# Patient Record
Sex: Female | Born: 1950 | Hispanic: No | Smoking: Former smoker
Health system: Southern US, Community
[De-identification: ages and names within clinical notes are randomized; demographics above are authoritative.]

## PROBLEM LIST (undated history)

## (undated) DIAGNOSIS — K279 Peptic ulcer, site unspecified, unspecified as acute or chronic, without hemorrhage or perforation: Secondary | ICD-10-CM

## (undated) DIAGNOSIS — K1379 Other lesions of oral mucosa: Secondary | ICD-10-CM

## (undated) DIAGNOSIS — Z923 Personal history of irradiation: Secondary | ICD-10-CM

## (undated) DIAGNOSIS — C411 Malignant neoplasm of mandible: Principal | ICD-10-CM

## (undated) HISTORY — PX: TONGUE BIOPSY: SHX1075

## (undated) HISTORY — PX: TONSILLECTOMY: SUR1361

## (undated) HISTORY — DX: Other lesions of oral mucosa: K13.79

## (undated) HISTORY — DX: Malignant neoplasm of mandible: C41.1

---

## 2013-02-12 ENCOUNTER — Encounter (HOSPITAL_COMMUNITY): Payer: Self-pay | Admitting: Emergency Medicine

## 2013-02-12 ENCOUNTER — Emergency Department (HOSPITAL_COMMUNITY)
Admission: EM | Admit: 2013-02-12 | Discharge: 2013-02-12 | Disposition: A | Discharge status: Home / Self Care | Attending: Emergency Medicine | Admitting: Emergency Medicine

## 2013-02-12 DIAGNOSIS — K047 Periapical abscess without sinus: Secondary | ICD-10-CM | POA: Insufficient documentation

## 2013-02-12 DIAGNOSIS — K1379 Other lesions of oral mucosa: Secondary | ICD-10-CM

## 2013-02-12 DIAGNOSIS — K1370 Unspecified lesions of oral mucosa: Secondary | ICD-10-CM | POA: Insufficient documentation

## 2013-02-12 DIAGNOSIS — F17200 Nicotine dependence, unspecified, uncomplicated: Secondary | ICD-10-CM | POA: Insufficient documentation

## 2013-02-12 DIAGNOSIS — K029 Dental caries, unspecified: Secondary | ICD-10-CM | POA: Insufficient documentation

## 2013-02-12 MED ORDER — HYDROCODONE-ACETAMINOPHEN 5-325 MG PO TABS: 1.0000 | ORAL_TABLET | Freq: Four times a day (QID) | ORAL | Status: DC | PRN

## 2013-02-12 MED ORDER — HYDROCODONE-ACETAMINOPHEN 5-325 MG PO TABS
1.0000 | ORAL_TABLET | Freq: Once | ORAL | Status: AC
  Administered 2013-02-12: 1 via ORAL
  Filled 2013-02-12: qty 1

## 2013-02-12 MED ORDER — PENICILLIN V POTASSIUM 500 MG PO TABS: 500.0000 mg | ORAL_TABLET | Freq: Four times a day (QID) | ORAL | Status: DC

## 2013-02-12 NOTE — ED Provider Notes (Signed)
Medical screening examination/treatment/procedure(s) were conducted as a shared visit with non-physician practitioner(s) and myself.  I personally evaluated the patient during the encounter.  EKG Interpretation   None       Patient is a 62 year-old female with a history of tobacco use who presents to the emergency department with pain at the floor of her right mouth since the beginning of September. She states the pain has been increasing making it difficult for her to EMS when she came to the emergency department. No fever. No drooling or trismus. No swelling of her face. On exam, patient is hemodynamically stable. No trismus or drooling. She has poor dentition but no obvious abscess. No submandibular swelling. No airway compromise. Patient has a raised, erythematous lesion to the floor of her mouth on the right side concerning for malignancy. We'll place patient on penicillin given her poor dentition in case there is any periapical abscess will also refer patient to ENT for close followup for biopsy. Given return precautions. Patient verbalizes understanding and states she is comfortable with outpatient followup. We'll discharge with pain medication.  Theresa Maw Ozelle Brubacher, DO 02/12/13 [phone]

## 2013-02-12 NOTE — ED Notes (Signed)
Pt st's she has had a sore in her mouth on right side for several months.  St's now it has caused her tooth to become loose and painful.  Also st's tongue on right side is painful

## 2013-02-12 NOTE — ED Provider Notes (Signed)
CSN: [id number]     Arrival date & time 02/12/13  1530 History   First MD Initiated Contact with Patient 02/12/13 1546     Chief Complaint  Patient presents with  . Dental Pain   (Consider location/radiation/quality/duration/timing/severity/associated sxs/prior Treatment) HPI Comments: Patient is a 62 yo F PMHx significant for tobacco use presented to emergency department for right-sided lower jaw pain that began in September. She states she's had increased swelling to the area along with increased pain. She states her pain is radiating to right side of her face and tongue. She rates her pain currently 6/10. She states is worsened with eating and drinking and palpation. She describes it as sore in nature. She states ibuprofen has minimally alleviated her pain until she ran out of it. She denies any fevers, drainage. She does not have a dentist.  Patient is a 62 y.o. female presenting with tooth pain.  Dental Pain Associated symptoms: no fever     History reviewed. No pertinent past medical history. History reviewed. No pertinent past surgical history. No family history on file. History  Substance Use Topics  . Smoking status: Current Every Day Smoker  . Smokeless tobacco: Not on file  . Alcohol Use: No   OB History   Grav Para Term Preterm Abortions TAB SAB Ect Mult Living                 Review of Systems  Constitutional: Negative for fever and chills.  HENT: Positive for dental problem. Negative for ear pain and sore throat.   Respiratory: Negative for shortness of breath.   Cardiovascular: Negative for chest pain.    Allergies  Review of patient's allergies indicates no known allergies.  Home Medications   Current Outpatient Rx  Name  Route  Sig  Dispense  Refill  . ibuprofen (ADVIL,MOTRIN) 200 MG tablet   Oral   Take 200-400 mg by mouth 2 (two) times daily as needed (pain).         Marland Kitchen HYDROcodone-acetaminophen (NORCO/VICODIN) 5-325 MG per tablet   Oral   Take 1-2  tablets by mouth every 6 (six) hours as needed for severe pain.   15 tablet   0   . penicillin v potassium (VEETID) 500 MG tablet   Oral   Take 1 tablet (500 mg total) by mouth 4 (four) times daily.   40 tablet   0    BP 175/85  Pulse 100  Temp(Src) 98.4 F (36.9 C) (Oral)  Resp 20  SpO2 98% Physical Exam  Constitutional: She is oriented to person, place, and time. She appears well-developed and well-nourished. No distress.  HENT:  Head: Normocephalic and atraumatic.  Right Ear: External ear normal.  Left Ear: External ear normal.  Nose: Nose normal.  Mouth/Throat: Uvula is midline, oropharynx is clear and moist and mucous membranes are normal. She does not have dentures. No trismus in the jaw. Abnormal dentition. Dental caries present. No uvula swelling or lacerations.    Swelling to area noted. No submental tenderness or swelling appreciated.   Eyes: Conjunctivae are normal.  Neck: Normal range of motion. Neck supple.  Pulmonary/Chest: Effort normal.  Neurological: She is alert and oriented to person, place, and time.  Skin: Skin is warm and dry. She is not diaphoretic.  Psychiatric: She has a normal mood and affect.    ED Course  Procedures (including critical care time) Labs Review Labs Reviewed - No data to display Imaging Review No results found.  EKG  Interpretation   None       MDM   1. Dental infection   2. Mass of oral cavity    Afebrile, NAD, non-toxic appearing, AAOx4. Patient with toothache. Patient with mass concerning for oral cancer. No gross abscess.  Exam unconcerning for Ludwig's angina or spread of infection.  Will treat with penicillin and pain medicine.  Urged patient to follow-up with oral surgeon and/or ENT to discuss biopsy for oral mass to r/o cancer. Patient is agreeable to plan. Patient is stable at time of discharge. Patient d/w with Dr. Elesa Massed, agrees with plan.         Jeannetta Ellis, PA-C 02/12/13 1847

## 2013-02-12 NOTE — ED Notes (Signed)
Pt here with mouth pain due to a sore in right side of mouth for several months.  St's now tooth on that side is loose also causing pain

## 2013-02-13 NOTE — ED Provider Notes (Signed)
Medical screening examination/treatment/procedure(s) were performed by non-physician practitioner and as supervising physician I was immediately available for consultation/collaboration.  EKG Interpretation   None         Xayla Puzio N Martinez Boxx, DO 02/13/13 0001 

## 2013-02-25 ENCOUNTER — Ambulatory Visit (INDEPENDENT_AMBULATORY_CARE_PROVIDER_SITE_OTHER): Admitting: Otolaryngology

## 2013-02-25 DIAGNOSIS — D3701 Neoplasm of uncertain behavior of lip: Secondary | ICD-10-CM

## 2013-03-05 ENCOUNTER — Telehealth: Payer: Self-pay | Admitting: Hematology and Oncology

## 2013-03-05 NOTE — Telephone Encounter (Signed)
C/D 03/05/13 for appt. 03/12/13

## 2013-03-12 ENCOUNTER — Ambulatory Visit (HOSPITAL_BASED_OUTPATIENT_CLINIC_OR_DEPARTMENT_OTHER)

## 2013-03-12 ENCOUNTER — Telehealth: Payer: Self-pay | Admitting: Hematology and Oncology

## 2013-03-12 ENCOUNTER — Ambulatory Visit: Payer: TRICARE For Life (TFL)

## 2013-03-12 ENCOUNTER — Encounter: Payer: Self-pay | Admitting: Hematology and Oncology

## 2013-03-12 ENCOUNTER — Encounter: Payer: Self-pay | Admitting: *Deleted

## 2013-03-12 ENCOUNTER — Ambulatory Visit (HOSPITAL_BASED_OUTPATIENT_CLINIC_OR_DEPARTMENT_OTHER): Admitting: Hematology and Oncology

## 2013-03-12 VITALS — BP 149/77 | HR 89 | Temp 98.6°F | Resp 20 | Ht 60.0 in | Wt 165.2 lb

## 2013-03-12 DIAGNOSIS — Z87891 Personal history of nicotine dependence: Secondary | ICD-10-CM

## 2013-03-12 DIAGNOSIS — C411 Malignant neoplasm of mandible: Secondary | ICD-10-CM

## 2013-03-12 DIAGNOSIS — K1379 Other lesions of oral mucosa: Secondary | ICD-10-CM

## 2013-03-12 DIAGNOSIS — K1370 Unspecified lesions of oral mucosa: Secondary | ICD-10-CM

## 2013-03-12 DIAGNOSIS — C031 Malignant neoplasm of lower gum: Secondary | ICD-10-CM

## 2013-03-12 HISTORY — DX: Malignant neoplasm of mandible: C41.1

## 2013-03-12 HISTORY — DX: Other lesions of oral mucosa: K13.79

## 2013-03-12 LAB — CBC WITH DIFFERENTIAL/PLATELET
BASO%: 0.8 % (ref 0.0–2.0)
Basophils Absolute: 0.1 10*3/uL (ref 0.0–0.1)
EOS%: 1.9 % (ref 0.0–7.0)
Eosinophils Absolute: 0.2 10*3/uL (ref 0.0–0.5)
HEMATOCRIT: 41.3 % (ref 34.8–46.6)
HEMOGLOBIN: 13.8 g/dL (ref 11.6–15.9)
LYMPH#: 1.9 10*3/uL (ref 0.9–3.3)
LYMPH%: 16.9 % (ref 14.0–49.7)
MCH: 29 pg (ref 25.1–34.0)
MCHC: 33.5 g/dL (ref 31.5–36.0)
MCV: 86.7 fL (ref 79.5–101.0)
MONO#: 0.8 10*3/uL (ref 0.1–0.9)
MONO%: 7.6 % (ref 0.0–14.0)
NEUT#: 8.1 10*3/uL — ABNORMAL HIGH (ref 1.5–6.5)
NEUT%: 72.8 % (ref 38.4–76.8)
Platelets: 312 10*3/uL (ref 145–400)
RBC: 4.76 10*6/uL (ref 3.70–5.45)
RDW: 13.1 % (ref 11.2–14.5)
WBC: 11.2 10*3/uL — AB (ref 3.9–10.3)

## 2013-03-12 LAB — COMPREHENSIVE METABOLIC PANEL (CC13)
ALBUMIN: 3.8 g/dL (ref 3.5–5.0)
ALT: 26 U/L (ref 0–55)
ANION GAP: 10 meq/L (ref 3–11)
AST: 20 U/L (ref 5–34)
Alkaline Phosphatase: 110 U/L (ref 40–150)
BUN: 12.4 mg/dL (ref 7.0–26.0)
CALCIUM: 9.9 mg/dL (ref 8.4–10.4)
CHLORIDE: 106 meq/L (ref 98–109)
CO2: 27 meq/L (ref 22–29)
CREATININE: 0.7 mg/dL (ref 0.6–1.1)
Glucose: 121 mg/dl (ref 70–140)
POTASSIUM: 4.3 meq/L (ref 3.5–5.1)
Sodium: 144 mEq/L (ref 136–145)
Total Bilirubin: 0.21 mg/dL (ref 0.20–1.20)
Total Protein: 7.6 g/dL (ref 6.4–8.3)

## 2013-03-12 MED ORDER — MORPHINE SULFATE 15 MG PO TABS: 15.0000 mg | ORAL_TABLET | ORAL | Status: DC | PRN

## 2013-03-12 MED ORDER — POLYETHYLENE GLYCOL 3350 17 G PO PACK: 17.0000 g | PACK | Freq: Every day | ORAL | Status: DC

## 2013-03-12 MED ORDER — PROMETHAZINE HCL 25 MG PO TABS: 25.0000 mg | ORAL_TABLET | Freq: Four times a day (QID) | ORAL | Status: DC | PRN

## 2013-03-12 NOTE — Progress Notes (Signed)
Met with patient and her dtr Shemariah during Initial Consult with Dr. Alvy Bimler.  Introduced my self a her Navigator, explained my support role while a patient at [facility], encouraged them to call at any time with questions/concerns.  Showed them location of [facility] for upcoming PET, explained check-in/registration process for future appts at [facility].  Patient indicated that family does not presently have a car; dtr stated they are presently managing transportation.  Pt indicated she lives at home alone; accompanying dtr lives "5 minutes away".  Pt has another dtr who is available to help her as needed.  Explained that we can provide resources to assist the patient; her appt with [facility] is pending.  She indicated understanding.   Forwarding this documentation to [employer], LCSW.  Gayleen Orem, RN, BSN, [facility] Navigator [phone]

## 2013-03-12 NOTE — Progress Notes (Signed)
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 female is here because of newly diagnosed cancer of the floor of the mouth. This patient had more than 45-pack-year smoking history and recently quit after diagnosis of invasive cancer. According to the patient, she started to notice pain something around September and has gotten progressively worse. The pain is interfering with her ability to ENT to food. She denies any recent weight loss. She was so felt that the tooth is getting loose. She has not received any dental care for long time. She went to the emergency department and was prescribed penicillin and referred to ENT. Dr. Benjamine Mola performed a biopsy in his clinic on 02/25/2013 and pathology reviewed invasive squamous cell carcinoma, moderately differentiated. P. 16 testing is pending She complained of persistent discharge and foul-smelling taste in her mouth. Her mouth pain is rated at 6/10 pain. She stopped taking any pain prescription right now. She denies any palpable lymphadenopathy in her neck. MEDICAL HISTORY:  Past Medical History  Diagnosis Date  . 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 Mouth pain 03/12/2013    SURGICAL HISTORY: Past Surgical History  Procedure Laterality Date  . Tongue biopsy    . Tonsillectomy      SOCIAL HISTORY: History   Social History  . Marital Status: Legally Separated    Spouse Name: N/A    Number of Children: N/A  . Years of Education: N/A   Occupational History  . Not on file.   Social History Main Topics  . Smoking status: Former Smoker -- 1.00 packs/day for 45 years  . Smokeless tobacco: Never Used  . Alcohol Use: No  .  Drug Use: No  . Sexual Activity: Not on file   Other Topics Concern  . Not on file   Social History Narrative  . No narrative on file    FAMILY HISTORY: Family History  Problem Relation Age of Onset  . Cancer Maternal Uncle     lung ca  . Cancer Daughter 28    breast ca    ALLERGIES:  has No Known Allergies.  MEDICATIONS:  Current Outpatient Prescriptions  Medication Sig Dispense Refill  . morphine (MSIR) 15 MG tablet Take 1 tablet (15 mg total) by mouth every 4 (four) hours as needed for severe pain.  60 tablet  0  . polyethylene glycol (MIRALAX) packet Take 17 g by mouth daily.  14 each  0  . promethazine (PHENERGAN) 25 MG tablet Take 1 tablet (25 mg total) by mouth every 6 (six) hours as needed for nausea or vomiting.  30 tablet  0   No current facility-administered medications for this visit.    REVIEW OF SYSTEMS:   Constitutional: Denies fevers, chills or abnormal night sweats Eyes: Denies blurriness of vision, double vision or watery eyes Respiratory: Denies cough, dyspnea or wheezes Cardiovascular: Denies palpitation, chest discomfort or lower extremity swelling Gastrointestinal:  Denies nausea, heartburn or change in bowel habits Skin: Denies abnormal skin rashes Lymphatics: Denies new lymphadenopathy or easy bruising Neurological:Denies numbness, tingling or new weaknesses Behavioral/Psych: Mood is stable, no new changes  All other systems were reviewed with the patient and are negative.  PHYSICAL EXAMINATION: ECOG PERFORMANCE STATUS: 1 -  Symptomatic but completely ambulatory  Filed Vitals:   03/12/13 1034  BP: 149/77  Pulse: 89  Temp: 98.6 F (37 C)  Resp: 20   Filed Weights   03/12/13 1034  Weight: 165 lb 3.2 oz (74.934 kg)    GENERAL:alert, no distress and comfortable SKIN: skin color, texture, turgor are normal, no rashes or significant lesions EYES: normal, conjunctiva are pink and non-injected, sclera clear OROPHARYNX: She has any squamous  cell carcinoma visible from oral exam, measure approximately 2.5 cm involving the alveolar ridge on the right lower mandible area which is tender to touch NECK: supple, thyroid normal size, non-tender, without nodularity LYMPH:  no palpable lymphadenopathy in the cervical, axillary or inguinal LUNGS: clear to auscultation and percussion with normal breathing effort HEART: regular rate & rhythm and no murmurs and no lower extremity edema ABDOMEN:abdomen soft, non-tender and normal bowel sounds Musculoskeletal:no cyanosis of digits and no clubbing  PSYCH: alert & oriented x 3 with fluent speech NEURO: no focal motor/sensory deficits  LABORATORY DATA:  I reviewed her pathology report ASSESSMENT:  Squamous cell carcinoma of the mandible  PLAN:  I will order bloodwork and PET/CT scan as soon as possible. Typically, cancer of the mandible is a surgical disease that is treated with surgery first In any case, I will order staging scan and see her back within the next 10 days to review test results. For her mouth pain, I'm prescribing immediate release morphine. I warned about potential risk of nausea and constipation I gave her some antiemetics and laxatives to take as needed. Orders Placed This Encounter  Procedures  . NM PET Image Initial (PI) Skull Base To Thigh    Standing Status: Future     Number of Occurrences:      Standing Expiration Date: 03/12/2014    Order Specific Question:  Reason for exam:    Answer:  staging mandibular cancer    Order Specific Question:  Preferred imaging location?    Answer:  [facility]  . CBC with Differential    Standing Status: Future     Number of Occurrences:      Standing Expiration Date: 12/02/2013  . Comprehensive metabolic panel    Standing Status: Future     Number of Occurrences:      Standing Expiration Date: 03/12/2014    All questions were answered. The patient knows to call the clinic with any problems, questions or concerns.     [place], Secor, MD 03/12/2013 11:10 AM

## 2013-03-12 NOTE — Telephone Encounter (Signed)
appts made per 03/12/13 POF AVS and CAL given shh °

## 2013-03-19 ENCOUNTER — Telehealth: Payer: Self-pay | Admitting: *Deleted

## 2013-03-19 ENCOUNTER — Encounter (HOSPITAL_COMMUNITY)
Admission: RE | Admit: 2013-03-19 | Discharge: 2013-03-19 | Disposition: A | Discharge status: Home / Self Care | Source: Ambulatory Visit | Attending: Hematology and Oncology | Admitting: Hematology and Oncology

## 2013-03-19 ENCOUNTER — Telehealth: Payer: Self-pay | Admitting: Hematology and Oncology

## 2013-03-19 DIAGNOSIS — C031 Malignant neoplasm of lower gum: Secondary | ICD-10-CM | POA: Insufficient documentation

## 2013-03-19 DIAGNOSIS — C411 Malignant neoplasm of mandible: Secondary | ICD-10-CM

## 2013-03-19 DIAGNOSIS — E079 Disorder of thyroid, unspecified: Secondary | ICD-10-CM | POA: Insufficient documentation

## 2013-03-19 DIAGNOSIS — K1379 Other lesions of oral mucosa: Secondary | ICD-10-CM

## 2013-03-19 DIAGNOSIS — R918 Other nonspecific abnormal finding of lung field: Secondary | ICD-10-CM | POA: Insufficient documentation

## 2013-03-19 LAB — GLUCOSE, CAPILLARY: Glucose-Capillary: 111 mg/dL — ABNORMAL HIGH (ref 70–99)

## 2013-03-19 MED ORDER — FLUDEOXYGLUCOSE F - 18 (FDG) INJECTION
19.2000 | Freq: Once | INTRAVENOUS | Status: AC | PRN
  Administered 2013-03-19: 19.2 via INTRAVENOUS

## 2013-03-19 NOTE — Telephone Encounter (Signed)
Left VM for pt to return nurse's call.  

## 2013-03-19 NOTE — Telephone Encounter (Signed)
S/w pt and informed of appt moved to Tues 1/13 from 1/15 since PET scan results are available.  She agreed and will be here to see Dr. Alvy Bimler at 1 pm on 1/13.

## 2013-03-19 NOTE — Telephone Encounter (Signed)
per 1/9 pof cx appt for 1/15 and r/s to 1/13 @ 1pm. per pof pt aware.

## 2013-03-19 NOTE — Telephone Encounter (Signed)
Message copied by Cathlean Cower on Fri Mar 19, 2013 11:18 AM ------      Message from: [facility], [place]      Created: Fri Mar 19, 2013 11:11 AM      Regarding: PEt scan result       Please call pt to move her appt to Tuesday. PET results are availble ------

## 2013-03-23 ENCOUNTER — Encounter: Payer: Self-pay | Admitting: Hematology and Oncology

## 2013-03-23 ENCOUNTER — Ambulatory Visit (HOSPITAL_BASED_OUTPATIENT_CLINIC_OR_DEPARTMENT_OTHER): Admitting: Hematology and Oncology

## 2013-03-23 VITALS — BP 132/69 | HR 87 | Temp 98.6°F | Resp 18 | Ht 60.0 in | Wt 162.6 lb

## 2013-03-23 DIAGNOSIS — R52 Pain, unspecified: Secondary | ICD-10-CM

## 2013-03-23 DIAGNOSIS — K5909 Other constipation: Secondary | ICD-10-CM

## 2013-03-23 DIAGNOSIS — K1379 Other lesions of oral mucosa: Secondary | ICD-10-CM

## 2013-03-23 DIAGNOSIS — C411 Malignant neoplasm of mandible: Secondary | ICD-10-CM

## 2013-03-23 DIAGNOSIS — R918 Other nonspecific abnormal finding of lung field: Secondary | ICD-10-CM

## 2013-03-23 MED ORDER — MORPHINE SULFATE 15 MG PO TABS: 15.0000 mg | ORAL_TABLET | ORAL | Status: DC | PRN

## 2013-03-23 NOTE — Progress Notes (Signed)
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 16 testing is pending   03/19/2013 Imaging PET/CT scan confirmed hypermetabolic activity in the mandible as well as adjacent ipsilateral lymph nodes. There are 2 pulmonary nodules noted of unknown etiology.    INTERVAL HISTORY: Theresa Stephens 63 y.o. female returns for further followup. With the morphine prescription, mouth pain is tolerable. She average about 4 or 5 pills per day. She has severe constipation. She denies any nausea.  I have reviewed the past medical history, past surgical history, social history and family history with the patient and they are unchanged from previous note.  ALLERGIES:  has No Known Allergies.  MEDICATIONS:  Current Outpatient Prescriptions  Medication Sig Dispense Refill  . morphine (MSIR) 15 MG tablet Take 1 tablet (15 mg total) by mouth every 4 (four) hours as needed for severe pain.  60 tablet  0  . polyethylene glycol (MIRALAX) packet Take 17 g by mouth daily.  14 each  0  . promethazine (PHENERGAN) 25 MG tablet Take 1 tablet (25 mg total) by mouth every 6 (six) hours as needed for nausea or  vomiting.  30 tablet  0   No current facility-administered medications for this visit.    REVIEW OF SYSTEMS:   Constitutional: Denies fevers, chills or abnormal weight loss All other systems were reviewed with the patient and are negative.  PHYSICAL EXAMINATION: ECOG PERFORMANCE STATUS: 1 - Symptomatic but completely ambulatory  Filed Vitals:   03/23/13 1259  BP: 132/69  Pulse: 87  Temp: 98.6 F (37 C)  Resp: 18   Filed Weights   03/23/13 1259  Weight: 162 lb 9.6 oz (73.755 kg)    GENERAL:alert, no distress and comfortable SKIN: skin color, texture, turgor are normal, no rashes or significant lesions Significant swelling noted on the right side of the mild with a mass noted, unchanged compared to previous exam NEURO: alert & oriented x 3 with fluent speech, no focal motor/sensory deficits  LABORATORY DATA:  I have reviewed the data as listed    Component Value Date/Time   NA 144 03/12/2013 1147   K 4.3 03/12/2013 1147   CO2 27 03/12/2013 1147   GLUCOSE 121 03/12/2013 1147   BUN 12.4 03/12/2013 1147   CREATININE 0.7 03/12/2013 1147   CALCIUM 9.9 03/12/2013 1147   PROT 7.6 03/12/2013 1147   ALBUMIN 3.8 03/12/2013 1147   AST 20 03/12/2013 1147   ALT 26 03/12/2013 1147   ALKPHOS 110 03/12/2013 1147   BILITOT 0.21 03/12/2013 1147    No results found for this basename: SPEP, UPEP,  kappa and lambda light chains    Lab Results  Component Value Date   WBC 11.2* 03/12/2013   NEUTROABS 8.1* 03/12/2013   HGB 13.8 03/12/2013   HCT 41.3 03/12/2013   MCV 86.7 03/12/2013   PLT 312 03/12/2013      Chemistry      Component Value Date/Time   NA 144 03/12/2013 1147   K 4.3 03/12/2013 1147   CO2 27 03/12/2013 1147   BUN 12.4 03/12/2013 1147   CREATININE 0.7 03/12/2013 1147      Component Value Date/Time   CALCIUM 9.9 03/12/2013 1147   ALKPHOS 110 03/12/2013 1147   AST 20 03/12/2013 1147   ALT 26 03/12/2013 1147   BILITOT 0.21 03/12/2013 1147     RADIOGRAPHIC STUDIES: I reviewed the PET/CT scan with her and her  daughter I have personally reviewed the radiological images as listed and agreed with the findings in the report. I am not too concerned about the lung nodule seen. 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 I plan to refer her to [facility] for consideration for surgical resection with possible reconstruction surgery. The patient understood after primary resection, she may need adjuvant treatment in the future. #2 mild pain Refill her pain prescription today #3 severe constipation I recommend she take laxatives as prescribed #4 lung nodules I recommend observation for now. She will need repeat staging scan after her primary resection. All questions were answered. The patient knows to call the clinic with any problems, questions or concerns. No barriers to learning was detected.   [facility], [place], MD 03/23/2013 1:52 PM

## 2013-03-24 ENCOUNTER — Telehealth: Payer: Self-pay | Admitting: Hematology and Oncology

## 2013-03-24 NOTE — Telephone Encounter (Signed)
Pt appt. With Dr. Macky Lower @ [place] is 04/07/12@2 :00. Medical records faxed, slides will be fedex'ed. Pt is aware to hand carry cd to appt.

## 2013-03-25 ENCOUNTER — Ambulatory Visit: Admitting: Hematology and Oncology

## 2013-04-09 ENCOUNTER — Telehealth: Payer: Self-pay | Admitting: Dietician

## 2013-04-09 NOTE — Telephone Encounter (Signed)
Brief Outpatient Oncology Nutrition Note  Patient has been identified to be at risk on malnutrition screen.  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  Patient for surgical resection at [facility].  Called patient due to weight loss.  Patient reports bad taste in the mouth and sore mouth making eating very difficult.  Patient has been taking nutritional supplements, yogurt and soup.    Will mail patient factsheets on taste alterations and sore mouth along with the [place] RD's contact information, coupons for nutritional supplements and recipe ideas for use of Ensure.  Discussed the importance of nutrition especially with upcoming surgery.  Patient does not have transportation and therefore was not interested in an appointment with the RD at this time.  Encouraged her to call if she has any questions.    Antonieta Iba, RD, LDN

## 2013-06-21 NOTE — Progress Notes (Signed)
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 Later in November she had some tooth pain and the tooth came lose. She presented to an ER at [place]. She saw Dr. Lurlean Leyden in medical oncology who referred her for biopsy and to Dr. Vicie Mutters:  M She had a biopsy on 02/25/13, which revealed moderately differentiated invasive squamous cell carcinoma.  -PET-CT scan of skull base to thigh (03/19/13) shows:  "1. Large hypermetabolic right mandibular mass and multiple hypermetabolic adjacent right-sided neck nodes. 2. Two small pulmonary nodules in the right lung are suspicious for metastasis but are below the limits of resolution for PET-CT. Close CT follow-up suggested. 3. No findings for metastatic disease involving the abdomen/pelvis. 4. Right-sided thyroid gland lesion does not show FDG uptake and is likely a benign lesion."  Physical exam  Biopsies (if applicable) revealed:  ACCESSION NUMBER: [id number] RECEIVED: 04/20/2013 ORDERING PHYSICIAN: Marijo Conception , MD PATIENT NAME: Theresa Stephens SURGICAL PATHOLOGY REPORT  FINAL PATHOLOGIC DIAGNOSIS MICROSCOPIC EXAMINATION AND DIAGNOSIS  A: RIGHT POSTERIOR MANDIBLE ALVEOLAR CANAL, EXCISION: Negative for malignancy.  B: MIDLINE FLOOR OF MOUTH, EXCISION: Negative for malignancy.  C: RIGHT VENTRAL TONGUE, EXCISION: Negative for malignancy.  D: RIGHT POSTERIOR MANDIBLE, BIOPSY: Negative for malignancy.  E: LYMPH NODES, RIGHT NECK, REGIONAL LYMPH NODE DISSECTION: Thirty-four lymph nodes, negative for malignancy (0/34). Salivary gland with no diagnostic abnormality.  F: RIGHT MANDIBLE, MANDIBULECTOMY: Invasive squamous cell carcinoma, well-differentiated (8.5 cm), pT4a pN0 pMX. Anterior mandible margin is positive for carcinoma. Anterior floor of mouth margin is positive  for severe squamous epithelial dysplasia. See Cancer Protocol.    LIP AND ORAL CANCER PROTOCOL  SPECIMEN: Lateral border of tongue Floor of mouth, NOS Mandible RECEIVED: Fresh PROCEDURE: Resection Glossectomy: partial glossectomy Mandibulectomy: right mandibulectomy and floor of mouth resection Neck (lymph node) dissection (specify): right zones 1-3 SPECIMEN SIZE: Greatest dimensions: 8.5 cm SPECIMEN LATERALITY: Right TUMOR SITE: Lateral border of tongue Floor of mouth, NOS Mandible TUMOR FOCALITY: Single focus TUMOR SIZE: Greatest dimension: 8.5 cm TUMOR THICKNESS: Tumor thickness: 1.2cm HISTOLOGIC TYPE: Squamous cell carcinoma, conventional HISTOLOGIC GRADE: G1 MARGINS: Margins involved by invasive carcinoma Margin(Stephens), per orientation: Anterior mandible margin Margins involved by carcinoma in situ (includes moderate and severe dysplasia#) Margin(Stephens), per orientation: Anterior floor of mouth margin (severe dysplasia) TREATMENT EFFECT: Not identified LYMPH-VASCULAR INVASION: Not identified PERINEURAL INVASION: Not identified LYMPH NODES, EXTRANODAL EXTENSION: Not identified PATHOLOGIC STAGING (pTNM) (Note I) PRIMARY TUMOR (pT) Excluding Mucosal Malig Melanoma: pT4a REG LYMPH NODES (pN) Excluding Mucosal Malig Melanoma: pN0 Number examined: 34 Number involved: 0 DISTANT METS (pM) Excluding Mucosal Malig Melanoma: Not Applicable   Previous Treatments: 1. 04/20/13: Tracheotomy; right selective neck dissections zones 1 through 3; right mandibulectomy and floor of mouth resection with partial glossectomy with right parascapular osteocutaneous free flap reconstruction. 2. 05/04/13: Mandibulectomy with resection of residual bone margin with revision of mandibular plating.       Nutrition Status:  Weight changes: Lost ~ 20 lbs since surgery.  Weight today 141.3 lbs  Swallowing status: Dyshagia.  Hard to maneuver food/liquids to swallow due to flap and tilting of tongue  since surgery.   Plans, if any, for PEG tube: Feeding tube.  Osmolite 8 0z cans.  Instills 3 cans daily and drinking fruit smoothies.    Tobacco/Marijuana/Snuff/ETOH use: 45 pack year history of  smoking, No drinking, No drugs   Past/Anticipated interventions by otolaryngology, if any: Dr. Macky Lower  Past/Anticipated interventions by medical oncology, if any: Dr. Heath Lark  Referrals yet, to any of the following?  Social Work?   Dentistry? Yes  Swallowing therapy? NO  Nutrition? In Hospital  Med/Onc? Dr. Alvy Bimler  PEG placement? Yes  SAFETY ISSUES:  Prior radiation? No  Pacemaker/ICD? No  Possible current pregnancy? No  Is the patient on methotrexate? No  Current Complaints / other details:  Pain in right upper jaw for tooth extraction 5 days ago.  Decreased rande of motion right arm since surgery - No rehabilitatiomn

## 2013-06-22 ENCOUNTER — Encounter: Payer: Self-pay | Admitting: Radiation Oncology

## 2013-06-23 ENCOUNTER — Ambulatory Visit
Admission: RE | Admit: 2013-06-23 | Discharge: 2013-06-23 | Disposition: A | Discharge status: Home / Self Care | Source: Ambulatory Visit | Attending: Radiation Oncology | Admitting: Radiation Oncology

## 2013-06-23 ENCOUNTER — Telehealth: Payer: Self-pay | Admitting: *Deleted

## 2013-06-23 ENCOUNTER — Encounter: Payer: Self-pay | Admitting: *Deleted

## 2013-06-23 ENCOUNTER — Encounter: Payer: Self-pay | Admitting: Radiation Oncology

## 2013-06-23 VITALS — BP 139/79 | HR 88 | Temp 98.6°F | Ht 60.0 in | Wt 141.3 lb

## 2013-06-23 DIAGNOSIS — C048 Malignant neoplasm of overlapping sites of floor of mouth: Secondary | ICD-10-CM

## 2013-06-23 DIAGNOSIS — C760 Malignant neoplasm of head, face and neck: Secondary | ICD-10-CM | POA: Insufficient documentation

## 2013-06-23 DIAGNOSIS — C041 Malignant neoplasm of lateral floor of mouth: Secondary | ICD-10-CM

## 2013-06-23 DIAGNOSIS — C411 Malignant neoplasm of mandible: Secondary | ICD-10-CM

## 2013-06-23 DIAGNOSIS — Z510 Encounter for antineoplastic radiation therapy: Secondary | ICD-10-CM | POA: Insufficient documentation

## 2013-06-23 LAB — BUN AND CREATININE (CC13)
BUN: 13.2 mg/dL (ref 7.0–26.0)
CREATININE: 0.6 mg/dL (ref 0.6–1.1)

## 2013-06-23 NOTE — Telephone Encounter (Signed)
Per my review of Dr. Octavia Heir, DDS, [facility], 06/17/13 note, he referred patient to Darel Hong, DDS, for follow-up treatments/care.  Per my call to Dr. Rozann Lesches office, she is not a patient of record, i.e she has not called to set up an appt.  Called patient, encouraged her to call Dr. Graciella Freer, provider her his phone number. [phone]).  She stated she would call him.    Gayleen Orem, RN, BSN, [facility] Head & Neck Oncology Navigator [phone]

## 2013-06-23 NOTE — Progress Notes (Addendum)
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 female who noticed a mass in her mouth in September 2014. She later developed tooth pain and a tooth came loose. She initially presented to the emergency room at [place] and was later seen by Dr. Alvy Stephens of medical oncology. She was ultimately referred to Dr. Vicie Stephens at [facility] otolaryngology. Biopsy in of the floor of mouth reavealed invasive squamous cell carcinoma. P. 16 stain was negative. She went underwent resection (partial glossectomy and right mandibulectomy and floor of mouth resection with flap reconstruction ) plus ipsilateral neck dissection on 04/20/2013 by Dr. Vicie Stephens. 0/34 lymph nodes revealed malignancy from right zones one through 3.. The tumor was unifocal, 8.5cm greatest dimension, 1.2 cm thick, grade 1. The anterior mandible margin was initially positive for carcinoma. Pathologic stage T4a N0. She underwent reexcision for positive margin on 05/04/2013 and no malignancy was identified in the pathology report.  She reports some swallowing difficulty related to postoperative changes in her mouth and she mainly eats smoothies + liquefied foods. Her tracheotomy has been closed and she has some residual stitches in her neck. She has lost about 20 pounds since surgery. She is using her PEG tube to supplement her nutrition. She quit smoking in 2014, December. She has never chewed tobacco and she denies alcohol abuse. She has had dental extractions performed at [place]: No  PAST MEDICAL HISTORY:  has a past medical history  of Mouth pain (03/12/2013) and Squamous cell carcinoma of mandibular alveolar ridge (03/12/2013).    PAST SURGICAL HISTORY: Past Surgical History  Procedure Laterality Date  . Tongue biopsy    . Tonsillectomy      FAMILY HISTORY: family history includes Cancer in her maternal uncle; Cancer (age of onset: 33) in her daughter.  SOCIAL HISTORY:  reports that she quit smoking about 3 months ago. She has never used smokeless tobacco. She reports that she does not drink alcohol or use illicit drugs.  ALLERGIES: Review of patient'Stephens allergies indicates no known allergies.  MEDICATIONS:  Current Outpatient Prescriptions  Medication Sig Dispense Refill  . FLUoxetine (PROZAC) 10 MG capsule Take 10 mg by mouth daily.      Marland Kitchen morphine (MSIR) 15 MG tablet Take 1 tablet (15 mg total) by mouth every 4 (four) hours as needed for severe pain.  60 tablet  0  . polyethylene glycol (MIRALAX) packet Take 17 g by mouth daily.  14 each  0  . promethazine (PHENERGAN) 25 MG tablet Take 1 tablet (25 mg total) by mouth every 6 (six) hours as needed for nausea or vomiting.  30 tablet  0   No current facility-administered medications for this encounter.    REVIEW OF SYSTEMS:  Notable for that above.   PHYSICAL EXAM:  height is 5' (1.524 m) and weight is 141 lb 4.8 oz (64.093 kg). Her temperature is 98.6 F (37 C). Her blood pressure is 139/79 and her pulse is 88. Her oxygen saturation is 100%.   General: Alert and oriented, in no acute distress HEENT: Head is normocephalic.  Extraocular  movements are intact. Flap reconstruction in mouth healed well. No bleeding, no signs of recurrence. Post operative swelling in face/right upper neck. No thrush. Neck: No palpable cervical or supraclavicular lymphadenopathy. Heart: Regular in rate and rhythm with no murmurs, rubs, or gallops. Chest: Clear to auscultation bilaterally, with no rhonchi, wheezes, or rales. Abdomen: Soft, nontender, nondistended, with no rigidity or  guarding. PEG tube in place. No sign of infection. Extremities: No cyanosis or edema. Decreased ROM in Right shoulder. Lymphatics: No concerning lymphadenopathy. Skin: No concerning lesions. R posterior shoulder healed from flap surgery. Musculoskeletal: Decreased ROM in Right shoulder. Neurologic: Cranial nerves II through XII are grossly intact. No obvious focalities. Speech is fluent. Coordination is intact. Psychiatric: Judgment and insight are intact. Affect is appropriate.    ECOG = 1  0 - Asymptomatic (Fully active, able to carry on all predisease activities without restriction)  1 - Symptomatic but completely ambulatory (Restricted in physically strenuous activity but ambulatory and able to carry out work of a light or sedentary nature. For example, light housework, office work)  2 - Symptomatic, <50% in bed during the day (Ambulatory and capable of all self care but unable to carry out any work activities. Up and about more than 50% of waking hours)  3 - Symptomatic, >50% in bed, but not bedbound (Capable of only limited self-care, confined to bed or chair 50% or more of waking hours)  4 - Bedbound (Completely disabled. Cannot carry on any self-care. Totally confined to bed or chair)  5 - Death   Eustace Pen MM, Creech RH, Tormey DC, et al. [phone]). "Toxicity and response criteria of the [facility]". [place] Oncol. 5 (6): 649-55   LABORATORY DATA:  Lab Results  Component Value Date   WBC 11.2* 03/12/2013   HGB 13.8 03/12/2013   HCT 41.3 03/12/2013   MCV 86.7 03/12/2013   PLT 312 03/12/2013   CMP     Component Value Date/Time   NA 144 03/12/2013 1147   K 4.3 03/12/2013 1147   CO2 27 03/12/2013 1147   GLUCOSE 121 03/12/2013 1147   BUN 13.2 06/23/2013 1019   CREATININE 0.6 06/23/2013 1019   CALCIUM 9.9 03/12/2013 1147   PROT 7.6 03/12/2013 1147   ALBUMIN 3.8 03/12/2013 1147   AST 20 03/12/2013 1147   ALT 26 03/12/2013 1147   ALKPHOS 110 03/12/2013 1147   BILITOT 0.21  03/12/2013 1147      PATHOLOGY: RECEIVED: 05/04/2013 ORDERING PHYSICIAN: Theresa Stephens , MD PATIENT NAME: Theresa Stephens SURGICAL PATHOLOGY REPORT  FINAL PATHOLOGIC DIAGNOSIS MICROSCOPIC EXAMINATION AND DIAGNOSIS   NEW BONE MARGIN, EXCISION: No malignancy identified    RECEIVED: 04/20/2013 ORDERING PHYSICIAN: Theresa Stephens , MD PATIENT NAME: Al Corpus, Brihanna Stephens SURGICAL PATHOLOGY REPORT  FINAL PATHOLOGIC DIAGNOSIS MICROSCOPIC EXAMINATION AND DIAGNOSIS  A: RIGHT POSTERIOR MANDIBLE ALVEOLAR CANAL, EXCISION: Negative for malignancy.  B: MIDLINE FLOOR OF MOUTH, EXCISION: Negative for malignancy.  C: RIGHT VENTRAL TONGUE, EXCISION: Negative for malignancy.  D: RIGHT POSTERIOR MANDIBLE, BIOPSY: Negative for malignancy.  E: LYMPH NODES, RIGHT NECK, REGIONAL LYMPH NODE DISSECTION: Thirty-four lymph nodes, negative for malignancy (0/34). Salivary gland with no diagnostic abnormality.  F: RIGHT MANDIBLE, MANDIBULECTOMY: Invasive squamous cell carcinoma, well-differentiated (8.5cm), pT4a pN0 pMX. Anterior mandible margin is positive for carcinoma.Anterior floor of mouth margin is positive for severesquamous epithelial dysplasia.  LIP AND ORAL CANCER PROTOCOL  SPECIMEN: Lateral border of tongue Floor of mouth, NOS Mandible RECEIVED: Fresh PROCEDURE: Resection Glossectomy:  partial glossectomy Mandibulectomy: right mandibulectomy and floor of mouth resection Neck (lymph node) dissection (specify): right zones 1-3 SPECIMEN SIZE: Greatest dimensions: 8.5 cm SPECIMEN LATERALITY: Right TUMOR SITE: Lateral border of tongue Floor of mouth, NOS Mandible TUMOR FOCALITY: Single focus TUMOR SIZE: Greatest dimension: 8.5 cm TUMOR THICKNESS: Tumor thickness: 1.2cm HISTOLOGIC TYPE: Squamous cell carcinoma, conventional HISTOLOGIC GRADE: G1 MARGINS: Margins involved by invasive carcinoma Margin(Stephens), per orientation: Anterior mandible margin Margins involved by carcinoma in  situ (includes moderate and severe dysplasia#) Margin(Stephens), per orientation: Anterior floor of mouth margin (severe dysplasia) TREATMENT EFFECT: Not identified LYMPH-VASCULAR INVASION: Not identified PERINEURAL INVASION: Not identified LYMPH NODES, EXTRANODAL EXTENSION: Not identified PATHOLOGIC STAGING (pTNM) (Note I) PRIMARY TUMOR (pT) Excluding Mucosal Malig Melanoma: pT4a REG LYMPH NODES (pN) Excluding Mucosal Malig Melanoma: pN0 Number examined: 34 Number involved: 0 DISTANT METS (pM) Excluding Mucosal Malig Melanoma: Not Applicable     RADIOGRAPHY: as above  IMPRESSION/PLAN  This is a delightful 63 year old woman with pathologic T4a N0, stage IVA 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   She is an excellent candidate for postoperative radiotherapy to decrease risk of recurrence but I would like to rule out metastatic disease first with a Chest CT (see discussion re: PET below).    1) PET scan showed 2 suspicious pulmonary nodules in January and close followup was recommended. Will obtain CT of Chest to make sure patient doesn't demonstrate clear metastatic disease before proceeding with adjuvant therapy.  2) She had no positive nodes, and margins are now negative. I do not think she will need chemotherapy but I will notify Dr Elson Areas of the patient'Stephens status.  3) Dentistry: Per [facility] notes:    1) Pre-radiation dental extractions as outlined above completed on 06/16/2013. 2) Bite streching appliance for use (tid-qid) during radiation treatment. Made 06/16/13 using 15 depressors.  3) Routine dental restorations and extractions (not in radiation field) as outlined above. Dental cleaning cleaning of all teeth to be retained. 4) Fluoride tray fabrication following necessary extractions and dental restorations. 5) Patient has been counseled on appropriate dental care/maintenance of all remaining teeth and follow-up during and after radiation to the head and neck.  Due  to transportation challenges the patient has desires to have treatment completed with dentist closer to home. Dentistry has arranged for patient to establish dental home with Dr. Darel Hong (ph. [phone]) and is scheduled for 06/29/2013.   My patient navigator got in touch with Dr. Rozann Lesches office and she has not yet made an appointment with him. It is unclear to me whether fluoride trays have actually been fabricated for her at [facility]. She reports she does not have any yet. My navigator will look into this. He will also informed dentistry at [facility] that her start date of radiation therapy is anticipated to be on  April 28; we will verify if followup is recommended in their clinic to verify adequate healing prior to start of radiotherapy  4) Will refer to social work for social support  5) Will refer to nutrition for nutrition support  6) PEG Tube was placed at [facility] - our nutritionist will speak to her about supplementation through her PEG tube  7) Will refer to swallowing therapy for dysphagia therapy  8) Refer to PT for baseline evaluation, may eventually need lymphedema treatment or future PT for deconditioning post therapy. Also - due to surgery, she has decreased ROM in Right shoulder.  9) Plan to simulate on 4-17, tentative start  date of RT on 4-28.  Anticipate approximately 6 weeks of treatment, 5 days a week.    It was a pleasure meeting the patient today. We discussed the risks, benefits, and side effects of radiotherapy. She understands adjuvant radiotherapy will give her the best chance of cure and local regional control.  We talked in detail about acute and late effects. She understands that some of the most bothersome acute effects will be significant soreness of the mouth and throat, changes in taste, changes in salivary function, skin irritation, hair loss, dehydration, weight loss and fatigue. We talked about late effects which include but are not necessarily  limited to dysphagia, hypothyroidism, dry mouth, trismus, nerve injury, spinal cord injury, and neck edema. No guarantees of treatment were given. A consent form was signed and placed in the patient'Stephens medical record. The patient is enthusiastic about proceeding with treatment. I look forward to participating in the patient'Stephens care.   ADDENDUM: CT of chest on 4-16 revealed "Stable indeterminate bilateral pulmonary nodules, suggesting benign  etiology. Continued followup by CT is recommended in 6 months". She also has a stable right thyroid nodule which may be worked up later once her acute issues are addressed. Will proceed with CT simulation on 4-17 as planned.     __________________________________________   Eppie Gibson, MD

## 2013-06-23 NOTE — Progress Notes (Signed)
Met with patient during initial consult with Dr. Isidore Moos.  Previously met patient and her dtr on 03/12/13 during appt with Dr. Alvy Bimler.  Re-introduced myself as her Navigator, explained my role as a member of the Care Team, provided contact information, encouraged her to contact me with questions/concerns as treatments/procedures begin.  She verbalized understanding.  Showed her an example of mask that is prepared for RT patients.  Provided her a tour of SIM and Tomo areas, explained treatments and arrival procedures.  She verbalized understanding.  Initiating navigation as L1 patient (new) with this encounter.  Gayleen Orem, RN, BSN, [facility] Head & Neck Oncology Navigator [phone]

## 2013-06-24 ENCOUNTER — Ambulatory Visit (HOSPITAL_COMMUNITY)
Admission: RE | Admit: 2013-06-24 | Discharge: 2013-06-24 | Disposition: A | Discharge status: Home / Self Care | Source: Ambulatory Visit | Attending: Radiation Oncology | Admitting: Radiation Oncology

## 2013-06-24 ENCOUNTER — Encounter (HOSPITAL_COMMUNITY): Payer: Self-pay

## 2013-06-24 DIAGNOSIS — E041 Nontoxic single thyroid nodule: Secondary | ICD-10-CM | POA: Insufficient documentation

## 2013-06-24 DIAGNOSIS — R911 Solitary pulmonary nodule: Secondary | ICD-10-CM | POA: Insufficient documentation

## 2013-06-24 DIAGNOSIS — C031 Malignant neoplasm of lower gum: Secondary | ICD-10-CM | POA: Insufficient documentation

## 2013-06-24 DIAGNOSIS — C411 Malignant neoplasm of mandible: Secondary | ICD-10-CM

## 2013-06-24 DIAGNOSIS — Z931 Gastrostomy status: Secondary | ICD-10-CM | POA: Insufficient documentation

## 2013-06-24 MED ORDER — IOHEXOL 300 MG/ML  SOLN
100.0000 mL | Freq: Once | INTRAMUSCULAR | Status: AC | PRN
  Administered 2013-06-24: 80 mL via INTRAVENOUS

## 2013-06-25 ENCOUNTER — Ambulatory Visit
Admission: RE | Admit: 2013-06-25 | Discharge: 2013-06-25 | Disposition: A | Discharge status: Home / Self Care | Source: Ambulatory Visit | Attending: Radiation Oncology | Admitting: Radiation Oncology

## 2013-06-25 ENCOUNTER — Encounter: Payer: Self-pay | Admitting: Radiation Oncology

## 2013-06-25 DIAGNOSIS — C411 Malignant neoplasm of mandible: Secondary | ICD-10-CM

## 2013-06-25 NOTE — Progress Notes (Signed)
Simulation, IMRT treatment planning, and Special treatment procedure note   outpatient  Diagnosis: head and neck cancer  The patient was taken to the CT simulator and laid in the supine position on the table. An Aquaplast head and shoulder mask was custom fitted to the patient's anatomy. High-resolution CT axial imaging was obtained of the head and neck. I verified that the quality of the imaging is good for treatment planning. 1 Medically Necessary Treatment Device was fabricated and supervised by me: Aquaplast mask.   Treatment planning note I plan to treat the patient with helical Tomotherapy, IMRT. I plan to treat the patient's tumor and adjacent neck nodes. I plan to treat to a total dose of 60 Gray in 30  fractions   I spoke with Dr. Macky Lower today and we are in agreement that she is at low risk for failure in the neck nodes (0 out of 34 nodes were positive at dissection.) Therefore, I will cover the tumor bed and the ipsilateral nodes in levels 1-2, but not beyond that, to lessen the risk of lymphedema and extensive mucositis.  IMRT planning Note  IMRT is an important modality to deliver adequate dose to the patient's at risk tissues while sparing the patient's normal structures, including the: esophagus, parotid tissue, mandible, brain stem, spinal cord, oral cavity, brachial plexus.  This justifies the use of IMRT in the patient's treatment.   -----------------------------------  Eppie Gibson, MD

## 2013-06-28 ENCOUNTER — Other Ambulatory Visit: Payer: Self-pay | Admitting: Radiation Oncology

## 2013-06-28 ENCOUNTER — Ambulatory Visit: Attending: Radiation Oncology

## 2013-06-28 ENCOUNTER — Ambulatory Visit: Payer: TRICARE For Life (TFL)

## 2013-06-28 DIAGNOSIS — C411 Malignant neoplasm of mandible: Secondary | ICD-10-CM

## 2013-06-28 DIAGNOSIS — IMO0001 Reserved for inherently not codable concepts without codable children: Secondary | ICD-10-CM | POA: Insufficient documentation

## 2013-06-28 DIAGNOSIS — R1310 Dysphagia, unspecified: Secondary | ICD-10-CM | POA: Insufficient documentation

## 2013-06-29 ENCOUNTER — Telehealth: Payer: Self-pay | Admitting: *Deleted

## 2013-06-29 ENCOUNTER — Other Ambulatory Visit: Payer: Self-pay | Admitting: Radiation Oncology

## 2013-06-29 ENCOUNTER — Other Ambulatory Visit (HOSPITAL_COMMUNITY): Payer: Self-pay | Admitting: Radiation Oncology

## 2013-06-29 DIAGNOSIS — R1310 Dysphagia, unspecified: Secondary | ICD-10-CM

## 2013-06-29 DIAGNOSIS — C411 Malignant neoplasm of mandible: Secondary | ICD-10-CM

## 2013-06-29 NOTE — Telephone Encounter (Signed)
Called patient to inform of MBSS for 06-30-13, lvm for a return call

## 2013-06-30 ENCOUNTER — Ambulatory Visit (HOSPITAL_COMMUNITY)
Admission: RE | Admit: 2013-06-30 | Discharge: 2013-06-30 | Disposition: A | Discharge status: Home / Self Care | Source: Ambulatory Visit | Attending: Radiation Oncology | Admitting: Radiation Oncology

## 2013-06-30 ENCOUNTER — Encounter: Admitting: Nutrition

## 2013-06-30 ENCOUNTER — Ambulatory Visit (HOSPITAL_COMMUNITY)

## 2013-06-30 ENCOUNTER — Encounter: Payer: Self-pay | Admitting: Nutrition

## 2013-06-30 DIAGNOSIS — C411 Malignant neoplasm of mandible: Secondary | ICD-10-CM

## 2013-06-30 DIAGNOSIS — R1310 Dysphagia, unspecified: Secondary | ICD-10-CM | POA: Insufficient documentation

## 2013-06-30 NOTE — Addendum Note (Signed)
Encounter addended by: Carlester Kasparek Mintz Elise Knobloch, RN on: 06/30/2013  1:16 PM<BR>     Documentation filed: Charges VN

## 2013-06-30 NOTE — Progress Notes (Signed)
Patient did not show up nor did she cancel nutrition appointment.

## 2013-06-30 NOTE — Procedures (Signed)
Objective Swallowing Evaluation: Modified Barium Swallowing Study  Patient Details  Name: Theresa Stephens MRN: [id number] Date of Birth: 12/05/1950  Today's Date: 06/30/2013 Time:  -     Past Medical History:  Past Medical History  Diagnosis Date  . Mouth pain 03/12/2013  . 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 Tongue biopsy    . Tonsillectomy     HPI:  Theresa Stephens is a 63 y.o. female who noticed a mass in her mouth in September 2014. She later developed tooth pain and a tooth came loose. Pt was initially seen at [facility] ED and subsequently underwent surgery at [facility].  Biopsy in of the floor of mouth reavealed invasive squamous cell carcinoma. She underwent resection (partial glossectomy and right mandibulectomy and floor of mouth resection with flap reconstruction ) plus ipsilateral neck dissection on 04/20/2013 by Dr. Vicie Mutters. She underwent reexcision for positive margin on 05/04/2013 and no malignancy was identified in the pathology report - selective neck dissection, trachestomy free flap scapular, and gastrostomy tube placement.  Pt had stoma closed by MD per MD notes 06/09/13 and pt reports much improved secretion management with trach removal.  Pt had a FEES study at [facility] but she does not recall specifics of results.  Pt reports post-op dysphagia but reports swallow is much better now than soon after surgery.  She is to begin radiation treatment soon per her statement - she states there is not a plan for chemotherapy at this time.   Currently pt is consuming smoothies, cream of chicken soups, etc and one po medication (Prozaac).  She states she is hungry and has lost weight and is using PEG tube for 3 cans of nutrition each day (sometimes 4 per pt - depending on if she feels "too full" in her stomach.  Pt admits to occasionally coughing on coffee but denies ever requiring heimlich maneuver  nor pulmonary infections.      Assessment / Plan / Recommendation Clinical Impression  Dysphagia Diagnosis: Moderately-Severe oral phase dysphagia;Mild pharyngeal phase dysphagia  Clinical impression: Moderately severe and mild pharyngeal dysphagia due to extensive change to structures from surgical intervention for oral cancer for which pt is compensating remarkably well.   Significantly decreased lingual range of motion and strength resulted in decreased coordination, delayed transiting and stasis requiring piecemeal deglutition.  Sensory impairments also predispose pt to oral residuals that pt may not sense- she reports swishing and expectorating with water after meals.  Pt's tongue appears to be displaced (to left and superior) by surgically placed tissue in oral cavity.    Pt compensates for oral deficits by using liquids to moisten foods, placing boluses on left side of her mouth and extending head upward for easier oral transiting.  Difficult to fully view oral cavity/lingual muscular during MBS due to mandibular hardware in place.    Pharyngeal swallow was timely with good laryngeal elevation and no aspiration or laryngeal penetration-even when challenged to conduct multilple sequential liquid swallows.  Minimally decreased tongue base retraction and pharyngeal contraction resulted in minimal amount of stasis with pudding/cracker that pt effectively cleared with liquid and dry swallows.    Recommend pt continue with skilled SLP to address oropharyngeal structure strength and ROM including mandibular, lingual and pharyngeal for maximal swallow rehabiliation.    Pt also demonstrated ability to "hock" per SLP request to determine if can expectorate stasis from vallecular space as indicated.  Advised pt to continue po intake of moist foods and thin liquids as tolerated. Recommend close monitoring of swallow ability during radiation tx due to post-surgical dysphagia that may become  significantly exacerbated/varied during treatment.  Importance of continuing to consume po and conduct exercises as able during radiation treatment reviewed.    Thanks for this referral of this pleasant pt    Treatment Recommendation  Defer treatment plan to SLP at (Comment) (outpt)    Diet Recommendation Dysphagia 2 (Fine chop);Thin liquid;Dysphagia 3 (Mechanical Soft);Dysphagia 1 (Puree)   Liquid Administration via: Cup;Straw Medication Administration: Crushed with puree (with liquid as tolerated, advised to alternative route if problematic) Supervision: Patient able to self feed Compensations: Slow rate;Small sips/bites;Check for pocketing;Multiple dry swallows after each bite/sip (start meal with liquid, use liquid to faciliate oral clearance, clean mouth after meals)  Consider purchasing water pic if MD/dentist indicates for oral care.   Postural Changes and/or Swallow Maneuvers: Seated upright 90 degrees;Upright 30-60 min after meal    Other  Recommendations Oral Care Recommendations: Oral care before and after PO   Follow Up Recommendations  Outpatient SLP           SLP Swallow Goals  Defer to outpt slp  General Date of Onset: 06/30/13 HPI: Theresa Stephens is a 63 y.o. female who noticed a mass in her mouth in September 2014. She later developed tooth pain and a tooth came loose. Pt was initially seen at [facility] ED and subsequently underwent surgery at [facility].  Biopsy in of the floor of mouth reavealed invasive squamous cell carcinoma. She underwent resection (partial glossectomy and right mandibulectomy and floor of mouth resection with flap reconstruction ) plus ipsilateral neck dissection on 04/20/2013 by Dr. Vicie Mutters. She underwent reexcision for positive margin on 05/04/2013 and no malignancy was identified in the pathology report - selective neck dissection, trachestomy free flap scapular, and gastrostomy tube placement.  Pt had stoma closed by MD per MD notes 06/09/13 and pt reports  much improved secretion management with trach removal.  Pt had a FEES study at [facility] but she does not recall specifics of results.  Pt reports post-op dysphagia but reports swallow is much better now than soon after surgery.  She is to begin radiation treatment soon per her statement - she states there is not a plan for chemotherapy at this time.   Currently pt is consuming smoothies, cream of chicken soups, etc and one po medication (Prozaac).  She states she is hungry and has lost weight and is using PEG tube for 3 cans of nutrition each day (sometimes 4 per pt - depending on if she feels "too full" in her stomach.  Pt admits to occasionally coughing on coffee but denies ever requiring heimlich maneuver nor pulmonary infections.  Type of Study: Modified Barium Swallowing Study Reason for Referral: Objectively evaluate swallowing function Diet Prior to this Study: Thin liquids;Dysphagia 1 (puree);PEG tube Temperature Spikes Noted: No Respiratory Status: Room air History of Recent Intubation: No Behavior/Cognition: Alert;Cooperative;Pleasant mood Oral Cavity - Dentition: Adequate natural dentition (some dentition missing) Oral Motor / Sensory Function: Impaired sensory;Impaired motor Oral impairment: Right lingual;Left lingual;Right facial;Right labial (pt also reports decreased sensation on left side of face) Self-Feeding Abilities: Able to feed self Patient Positioning: Upright in bed Baseline Vocal Quality: Clear Volitional Cough: Strong Volitional Swallow: Able to elicit Anatomy: Other (Comment) (anatomical changes from prior oral surgeries) Pharyngeal Secretions: Not observed secondary MBS    Reason for Referral Objectively evaluate swallowing function  Oral Phase Oral Preparation/Oral Phase Oral Phase: Impaired Oral - Nectar Oral - Nectar Cup: Weak lingual manipulation;Delayed oral transit;Piecemeal swallowing;Reduced posterior propulsion;Lingual/palatal residue Oral - Thin Oral -  Thin Teaspoon: Weak lingual manipulation;Delayed oral transit;Piecemeal swallowing;Reduced posterior propulsion;Lingual/palatal residue Oral - Thin Cup: Weak lingual manipulation;Delayed oral transit;Piecemeal swallowing;Reduced posterior propulsion;Lingual/palatal residue Oral - Thin Straw: Weak lingual manipulation;Delayed oral transit;Piecemeal swallowing;Reduced posterior propulsion;Lingual/palatal residue Oral - Solids Oral - Puree: Weak lingual manipulation;Delayed oral transit;Piecemeal swallowing;Reduced posterior propulsion;Lingual/palatal residue Oral - Mechanical Soft: Weak lingual manipulation;Delayed oral transit;Piecemeal swallowing;Reduced posterior propulsion;Lingual/palatal residue Oral Phase - Comment Oral Phase - Comment: pt places boluses on left side of mouth, piecemealing was functional for her, limited jaw ROM noted, tongue limited ROM likely contributed greatly by floor of mouth resection-flap repair   Pharyngeal Phase Pharyngeal Phase Pharyngeal Phase: Impaired Pharyngeal - Nectar Pharyngeal - Nectar Cup: Reduced pharyngeal peristalsis;Reduced tongue base retraction Pharyngeal - Thin Pharyngeal - Thin Teaspoon: Reduced pharyngeal peristalsis;Reduced tongue base retraction Pharyngeal - Thin Cup: Reduced pharyngeal peristalsis;Reduced tongue base retraction Pharyngeal - Thin Straw: Reduced pharyngeal peristalsis;Reduced tongue base retraction Pharyngeal - Solids Pharyngeal - Puree: Reduced tongue base retraction;Reduced pharyngeal peristalsis Pharyngeal - Mechanical Soft: Reduced pharyngeal peristalsis;Reduced tongue base retraction Pharyngeal Phase - Comment Pharyngeal Comment: mildly impaired tongue base retraction and pharyngeal persistalsis with minimal pharyngeal residuals that clear with dry and liquid swallows, pt observed consuming sequential boluses of thin liquids with adequate airway protection and clearance  Cervical Esophageal Phase    GO    Cervical  Esophageal Phase Cervical Esophageal Phase: Impaired Cervical Esophageal Phase - Comment Cervical Esophageal Comment: Pt appeared with very minimally delayed clearance at distal esophagus of thin x1 at distal esophagus without backflow - radiologist not present during testing, suspect may be [facility]    Functional Assessment Tool Used: mbs, clinical judgement Functional Limitations: Swallowing Swallow Current Status (T1572): At least 60 percent but less than 80 percent impaired, limited or restricted Swallow Goal Status [phone]): At least 60 percent but less than 80 percent impaired, limited or restricted Swallow Discharge Status [phone]): At least 60 percent but less than 80 percent impaired, limited or restricted    Claudie Fisherman, [facility] SLP [phone]

## 2013-07-02 ENCOUNTER — Ambulatory Visit: Admitting: Physical Therapy

## 2013-07-06 ENCOUNTER — Ambulatory Visit
Admission: RE | Admit: 2013-07-06 | Discharge: 2013-07-06 | Disposition: A | Discharge status: Home / Self Care | Source: Ambulatory Visit | Attending: Radiation Oncology | Admitting: Radiation Oncology

## 2013-07-06 ENCOUNTER — Encounter: Payer: Self-pay | Admitting: *Deleted

## 2013-07-06 DIAGNOSIS — C411 Malignant neoplasm of mandible: Secondary | ICD-10-CM

## 2013-07-06 NOTE — Progress Notes (Signed)
To provide support and encouragement, met with patient during [facility] on [place].  Encouraged her to think about tmts one day at a time.  She verbalized agreement.   Continuing navigation as L1 patient (new patient).  Gayleen Orem, RN, BSN, [facility] Head & Neck Oncology Navigator [phone]

## 2013-07-07 ENCOUNTER — Ambulatory Visit

## 2013-07-07 ENCOUNTER — Ambulatory Visit
Admission: RE | Admit: 2013-07-07 | Discharge: 2013-07-07 | Disposition: A | Discharge status: Home / Self Care | Source: Ambulatory Visit | Attending: Radiation Oncology | Admitting: Radiation Oncology

## 2013-07-07 NOTE — Progress Notes (Signed)
IMRT Device Note outpatient 6.6   delivered field widths represent one set of IMRT treatment devices. The code is [phone].  -----------------------------------  Eppie Gibson, MD

## 2013-07-08 ENCOUNTER — Ambulatory Visit
Admission: RE | Admit: 2013-07-08 | Discharge: 2013-07-08 | Disposition: A | Discharge status: Home / Self Care | Source: Ambulatory Visit | Attending: Radiation Oncology | Admitting: Radiation Oncology

## 2013-07-08 ENCOUNTER — Ambulatory Visit: Admitting: Radiation Oncology

## 2013-07-08 DIAGNOSIS — C411 Malignant neoplasm of mandible: Secondary | ICD-10-CM

## 2013-07-08 MED ORDER — BIAFINE EX EMUL
Freq: Two times a day (BID) | CUTANEOUS | Status: DC
  Administered 2013-07-08: 16:00:00 via TOPICAL

## 2013-07-08 NOTE — Progress Notes (Signed)
Post sim ed completed w/pt. Gave pt "Radiation and You" booklet w/all pertinent information marked and discussed, re: fatigue, mouth -throat pain/ management, dry mouth/care, hair loss/hair care, altered taste, nausea/management, skin irritation/care, nutrition. Gave pt Biafine w/instructions for proper use. Pt verbalized understanding. Will have pt scheduled for nutrition consult. She has G tube which she uses to take in Osmolite 2-3 cans daily. She is eating a few soft foods.  Pt expressed two concerns today. She states that the mask she wears during treatment is tight on her old trach site and uncomfortable. She states that when she is getting treatment it is "difficult to breath out". She states she has to open her mouth slightly to breath well.  She also states her G tube area is irritated. It is flush against the skin of her abdomen, left side. She puts gauze beneath the tube. The area just around the insertion site is red w/scant amount yellow drainage; no odor present. Pt cleans this area with hydrogen peroxide. Advised she cleanse the area and apply Neosporin to the skin only and leave open to air. Advised pt come back to nursing tomorrow if this area is worse or she has more concerns.   Advised pt will route her concerns to Malachy Mood, RN and Dr Isidore Moos who is in office tomorrow. Pt verbalized agreement, understanding. In basket sent to Dr Jimmy Picket, RN.

## 2013-07-09 ENCOUNTER — Ambulatory Visit
Admission: RE | Admit: 2013-07-09 | Discharge: 2013-07-09 | Disposition: A | Discharge status: Home / Self Care | Source: Ambulatory Visit | Attending: Radiation Oncology | Admitting: Radiation Oncology

## 2013-07-09 NOTE — Addendum Note (Signed)
Encounter addended by: Deirdre Evener, RN on: 07/09/2013  8:56 PM<BR>     Documentation filed: Inpatient Document Flowsheet

## 2013-07-12 ENCOUNTER — Ambulatory Visit
Admission: RE | Admit: 2013-07-12 | Discharge: 2013-07-12 | Disposition: A | Discharge status: Home / Self Care | Source: Ambulatory Visit | Attending: Radiation Oncology | Admitting: Radiation Oncology

## 2013-07-12 ENCOUNTER — Encounter: Payer: Self-pay | Admitting: Radiation Oncology

## 2013-07-12 VITALS — BP 134/88 | HR 80 | Temp 98.4°F | Ht 60.0 in | Wt 139.7 lb

## 2013-07-12 DIAGNOSIS — C411 Malignant neoplasm of mandible: Secondary | ICD-10-CM

## 2013-07-12 MED ORDER — MAGIC MOUTHWASH W/LIDOCAINE: ORAL | Status: DC

## 2013-07-12 MED ORDER — FLUCONAZOLE 100 MG PO TABS: ORAL_TABLET | ORAL | Status: DC

## 2013-07-12 NOTE — Progress Notes (Signed)
Ms. Patchen has received 5 fractions to her right oral cavity and right neck.  She C/O irritation of her right tongue and note irritated area with whiteness of her tongue.  She continues to have numbness of her tongue.  She reports that she is now using her peg tube with instillation of 3 cans of Osmolite, but encouraged to add 2- 3 more cans daily since she is no longer eating by mouth.  She reports that she has been experiencing daily, occipital headaches since the start of treatment but obtains relief with Ibuprofen.  No skin changes noted at this time.

## 2013-07-12 NOTE — Progress Notes (Signed)
   Weekly Management Note:  outpatient Current Dose:  10 Gy  Projected Dose: 60 Gy   Narrative:  The patient presents for routine under treatment assessment.  CBCT/MVCT images/Port film x-rays were reviewed.  The chart was checked. She is doing relatively well.  She talked with me about her baseline depression but she takes antidepressants in the morning which are helpful. She reports a sense of cotton mouth. She has been seen Garald Balding after her swallowing studies and is performing swallowing exercises even though she takes almost all of her nutrition by PEG tube. She reports numbness of her tongue. She has had some occipital headaches that are relieved by ibuprofen. She is tolerating the mask as long as she keeps her mouth slightly open to breathe  Physical Findings:  height is 5' (1.524 m) and weight is 139 lb 11.2 oz (63.368 kg). Her temperature is 98.4 F (36.9 C). Her blood pressure is 134/88 and her pulse is 80. Her oxygen saturation is 98%.  white film in her oral cavity suspicious for thrush  Impression:  The patient is tolerating radiotherapy.  Plan:  Continue radiotherapy as planned. I will prescribe a week of fluconazole for possible thrush. I told her it is okay to keep her mouth slightly open but to try not to open it  very much so that her mouth  position is consistent with her simulation position. No matter what, the mask should be tight enough so that she cannot move her mouth significantly. I also gave her a  Recipe for baking soda rinses for her mouth and will also prescribe Magic mouthwash for irritation.  ________________________________   Eppie Gibson, M.D.

## 2013-07-12 NOTE — Patient Instructions (Signed)
Baking Soda Rinse - 1 tsp salt, 1 tsp baking soda, 1 qt water - swish gargle and spit as needed to soothe/cleanse mouth. Use as often as you want.    Magic mouthwash with Lidocaine - Swish/swallow 58mL, up to 4 times daily. If you use 36mL at a time, you can use it 8 times daily.

## 2013-07-13 ENCOUNTER — Ambulatory Visit: Attending: Radiation Oncology | Admitting: Physical Therapy

## 2013-07-13 ENCOUNTER — Ambulatory Visit
Admission: RE | Admit: 2013-07-13 | Discharge: 2013-07-13 | Disposition: A | Discharge status: Home / Self Care | Source: Ambulatory Visit | Attending: Radiation Oncology | Admitting: Radiation Oncology

## 2013-07-13 DIAGNOSIS — IMO0001 Reserved for inherently not codable concepts without codable children: Secondary | ICD-10-CM | POA: Insufficient documentation

## 2013-07-13 DIAGNOSIS — R1310 Dysphagia, unspecified: Secondary | ICD-10-CM | POA: Insufficient documentation

## 2013-07-14 ENCOUNTER — Ambulatory Visit
Admission: RE | Admit: 2013-07-14 | Discharge: 2013-07-14 | Disposition: A | Discharge status: Home / Self Care | Source: Ambulatory Visit | Attending: Radiation Oncology | Admitting: Radiation Oncology

## 2013-07-15 ENCOUNTER — Ambulatory Visit
Admission: RE | Admit: 2013-07-15 | Discharge: 2013-07-15 | Disposition: A | Discharge status: Home / Self Care | Source: Ambulatory Visit | Attending: Radiation Oncology | Admitting: Radiation Oncology

## 2013-07-16 ENCOUNTER — Ambulatory Visit
Admission: RE | Admit: 2013-07-16 | Discharge: 2013-07-16 | Disposition: A | Discharge status: Home / Self Care | Source: Ambulatory Visit | Attending: Radiation Oncology | Admitting: Radiation Oncology

## 2013-07-19 ENCOUNTER — Encounter: Payer: Self-pay | Admitting: *Deleted

## 2013-07-19 ENCOUNTER — Ambulatory Visit
Admission: RE | Admit: 2013-07-19 | Discharge: 2013-07-19 | Disposition: A | Discharge status: Home / Self Care | Source: Ambulatory Visit | Attending: Radiation Oncology | Admitting: Radiation Oncology

## 2013-07-19 ENCOUNTER — Ambulatory Visit: Admitting: Physical Therapy

## 2013-07-19 ENCOUNTER — Encounter: Payer: Self-pay | Admitting: Radiation Oncology

## 2013-07-19 VITALS — BP 128/77 | HR 77 | Temp 99.1°F | Ht 60.0 in | Wt 136.1 lb

## 2013-07-19 DIAGNOSIS — C411 Malignant neoplasm of mandible: Secondary | ICD-10-CM

## 2013-07-19 MED ORDER — OXYCODONE HCL 5 MG/5ML PO SOLN: 5.0000 mg | ORAL | Status: DC | PRN

## 2013-07-19 NOTE — Progress Notes (Signed)
   Weekly Management Note:  outpatient Current Dose:  20 Gy  Projected Dose: 60 Gy   Narrative:  The patient presents for routine under treatment assessment.  CBCT/MVCT images/Port film x-rays were reviewed.  The chart was checked. She is doing relatively well.   Pain in mouth is 4/10. White film on tongue the same despite fluconazole. Weight down 3 lb.  Physical Findings:  height is 5' (1.524 m) and weight is 136 lb 1.6 oz (61.735 kg). Her temperature is 99.1 F (37.3 C). Her blood pressure is 128/77 and her pulse is 77.  NAD, oral cavity - white /yellow film over tongue . No significant erythema.  Impression:  The patient is tolerating radiotherapy.  Plan:  Continue radiotherapy as planned. Roxicodone 5mg /65mL Rx'd for pain today. I suspect that her changes over her tongue are more likely dead mucosal cells rather than fluconazole since they did not improve with her medication. However I told her she can brush her tongue with a toothbrush or with a wet washcloth to slough off the cells. She has a brisk gag reflex am not sure this will be realistic. Alternatively she can try baking soda and salt water mouth rinses.  I let the patient know that my partner, Dr. Valere Dross, will be covering for me next week during my vacation.  ________________________________   Eppie Gibson, M.D.

## 2013-07-19 NOTE — Progress Notes (Addendum)
Theresa Stephens has received 10 fractions to her right oral cavity and right neck.  She is c/o of pain ( 4/10) in her tongue(tip of tongue) and note redness, swelling and white coating on her tongue.  She instills 4 cans of Osmolite via her PEG tube with rinsing with water. Her only oral intake is when she is taking pills. Maintaining her weight.    Cleansed her peg site with sterile  0.9 saline and applied sponge gauze to protect skin at the site and to take pressure off of her abdomen.  Given gauze and sterile saline.

## 2013-07-20 ENCOUNTER — Ambulatory Visit
Admission: RE | Admit: 2013-07-20 | Discharge: 2013-07-20 | Disposition: A | Discharge status: Home / Self Care | Source: Ambulatory Visit | Attending: Radiation Oncology | Admitting: Radiation Oncology

## 2013-07-21 ENCOUNTER — Ambulatory Visit
Admission: RE | Admit: 2013-07-21 | Discharge: 2013-07-21 | Disposition: A | Discharge status: Home / Self Care | Source: Ambulatory Visit | Attending: Radiation Oncology | Admitting: Radiation Oncology

## 2013-07-21 ENCOUNTER — Encounter: Payer: TRICARE For Life (TFL) | Admitting: Nutrition

## 2013-07-22 ENCOUNTER — Ambulatory Visit
Admission: RE | Admit: 2013-07-22 | Discharge: 2013-07-22 | Disposition: A | Discharge status: Home / Self Care | Source: Ambulatory Visit | Attending: Radiation Oncology | Admitting: Radiation Oncology

## 2013-07-22 ENCOUNTER — Ambulatory Visit: Admitting: Physical Therapy

## 2013-07-23 ENCOUNTER — Ambulatory Visit
Admission: RE | Admit: 2013-07-23 | Discharge: 2013-07-23 | Disposition: A | Discharge status: Home / Self Care | Source: Ambulatory Visit | Attending: Radiation Oncology | Admitting: Radiation Oncology

## 2013-07-26 ENCOUNTER — Ambulatory Visit
Admission: RE | Admit: 2013-07-26 | Discharge: 2013-07-26 | Disposition: A | Discharge status: Home / Self Care | Source: Ambulatory Visit | Attending: Radiation Oncology | Admitting: Radiation Oncology

## 2013-07-26 ENCOUNTER — Encounter: Payer: Self-pay | Admitting: Radiation Oncology

## 2013-07-26 VITALS — BP 116/75 | HR 88 | Temp 98.0°F | Resp 20 | Wt 134.9 lb

## 2013-07-26 DIAGNOSIS — C411 Malignant neoplasm of mandible: Secondary | ICD-10-CM

## 2013-07-26 NOTE — Progress Notes (Signed)
Weekly Management Note:  Site: Right oral cavity/neck Current Dose:  3000  cGy Projected Dose: 6000  cGy  Narrative: The patient is seen today for routine under treatment assessment. CBCT/MVCT images/port films were reviewed. The chart was reviewed.   She continues to have mouth discomfort (5/10) for which she uses Magic mouthwash and baking soda/warm water rinses. She has oxycodone elixir to take when necessary. She uses her PEG tube for nutrition. Her weight is down 1 pound over the past week.  Physical Examination:  Filed Vitals:   07/26/13 1500  BP: 116/75  Pulse: 88  Temp: 98 F (36.7 C)  Resp: 20  .  Weight: 134 lb 14.4 oz (61.19 kg). On inspection oral cavity there is patchy whitish debris along the tongue and bucca mucosa. This does not have the typical "Candida appearance ". She had been on fluconazole without improvement, I do not think that this represents candidiasis.   Impression: Tolerating radiation therapy well. Continue with her oral care as planned.  Plan: Continue radiation therapy as planned.

## 2013-07-26 NOTE — Progress Notes (Signed)
To provide support, encouragement and care continuity, met with patient during her weekly UT with Dr. Squire.  No needs were expressed, I encouraged her to contact me should that change.  She verbalized understanding.  Continuing navigation as L1 patient (new patient).  Rick Diehl, RN, BSN, [facility] Head & Neck Oncology Navigator [phone]  

## 2013-07-26 NOTE — Progress Notes (Signed)
Weekly rad txs  15/30 right oral cav/neck, erythema on  Right side face and right neck, uses biafine cream bid, tkes via peg tube 3 cans osmolite daily, flushes with free water before and after feedings, can take small amount sips water, uses MMW, ans baking soda with warm water rinses,  , pain 5/10 scale, not brushing her tongue with toothbrush or wash cloth per Dr.Squire suggestion last week,  Fatigued.

## 2013-07-27 ENCOUNTER — Ambulatory Visit
Admission: RE | Admit: 2013-07-27 | Discharge: 2013-07-27 | Disposition: A | Discharge status: Home / Self Care | Source: Ambulatory Visit | Attending: Radiation Oncology | Admitting: Radiation Oncology

## 2013-07-27 ENCOUNTER — Ambulatory Visit: Admitting: Physical Therapy

## 2013-07-28 ENCOUNTER — Ambulatory Visit
Admission: RE | Admit: 2013-07-28 | Discharge: 2013-07-28 | Disposition: A | Discharge status: Home / Self Care | Source: Ambulatory Visit | Attending: Radiation Oncology | Admitting: Radiation Oncology

## 2013-07-29 ENCOUNTER — Ambulatory Visit
Admission: RE | Admit: 2013-07-29 | Discharge: 2013-07-29 | Disposition: A | Discharge status: Home / Self Care | Source: Ambulatory Visit | Attending: Radiation Oncology | Admitting: Radiation Oncology

## 2013-07-29 ENCOUNTER — Ambulatory Visit

## 2013-07-30 ENCOUNTER — Ambulatory Visit
Admission: RE | Admit: 2013-07-30 | Discharge: 2013-07-30 | Disposition: A | Discharge status: Home / Self Care | Source: Ambulatory Visit | Attending: Radiation Oncology | Admitting: Radiation Oncology

## 2013-08-03 ENCOUNTER — Ambulatory Visit: Admitting: Physical Therapy

## 2013-08-03 ENCOUNTER — Ambulatory Visit
Admission: RE | Admit: 2013-08-03 | Discharge: 2013-08-03 | Disposition: A | Discharge status: Home / Self Care | Source: Ambulatory Visit | Attending: Radiation Oncology | Admitting: Radiation Oncology

## 2013-08-04 ENCOUNTER — Ambulatory Visit: Admitting: Radiation Oncology

## 2013-08-04 ENCOUNTER — Ambulatory Visit
Admission: RE | Admit: 2013-08-04 | Discharge: 2013-08-04 | Disposition: A | Discharge status: Home / Self Care | Source: Ambulatory Visit | Attending: Radiation Oncology | Admitting: Radiation Oncology

## 2013-08-05 ENCOUNTER — Ambulatory Visit
Admission: RE | Admit: 2013-08-05 | Discharge: 2013-08-05 | Disposition: A | Discharge status: Home / Self Care | Source: Ambulatory Visit | Attending: Radiation Oncology | Admitting: Radiation Oncology

## 2013-08-05 ENCOUNTER — Ambulatory Visit: Admitting: Physical Therapy

## 2013-08-06 ENCOUNTER — Encounter: Payer: Self-pay | Admitting: *Deleted

## 2013-08-06 ENCOUNTER — Encounter: Payer: Self-pay | Admitting: Radiation Oncology

## 2013-08-06 ENCOUNTER — Other Ambulatory Visit: Payer: Self-pay | Admitting: Radiation Oncology

## 2013-08-06 ENCOUNTER — Ambulatory Visit
Admission: RE | Admit: 2013-08-06 | Discharge: 2013-08-06 | Disposition: A | Discharge status: Home / Self Care | Source: Ambulatory Visit | Attending: Radiation Oncology | Admitting: Radiation Oncology

## 2013-08-06 DIAGNOSIS — C411 Malignant neoplasm of mandible: Secondary | ICD-10-CM

## 2013-08-06 MED ORDER — FLUCONAZOLE 100 MG PO TABS: ORAL_TABLET | ORAL | Status: DC

## 2013-08-06 NOTE — Progress Notes (Signed)
   Weekly Management Note:  outpatient Current Dose:  46 Gy  Projected Dose: 60 Gy   Narrative:  The patient presents for routine under treatment assessment.  CBCT/MVCT images/Port film x-rays were reviewed.  The chart was checked. Poor taste, thickened secretions, hard to swallow.  Using PEG.  Urine light color. Not dizzy. Pain well controlled.  Physical Findings:  Oral mucosa- white film, suspicious for thrush.  Thick secretions  Vitals with Age-Percentiles 12/02/2681  Length   Systolic 419  Diastolic 82  Pulse 80  Respiration   Weight 60.555 kg  BMI   VISIT REPORT     Impression:  The patient is tolerating radiotherapy.  Plan:  Continue radiotherapy as planned. Fluconazole Rx'd for thrush.  Baking soda rinse and spit for thick secretions.  ________________________________   Eppie Gibson, M.D.

## 2013-08-06 NOTE — Progress Notes (Signed)
Theresa Stephens has finished 23/30 RT.  She reports pain level 2 in jaw r/t reconstruction screws.  No oral intake; nutritional intake via g-tube, 3 cans daily, instilling ca one 8 oz bottle water/day through g-tube.  Unable to swallow d/t tongue swelling.  Bowels moving daily.  Skin in area of treatment red, intact; applying Biafine BID.  She reports occasional coughing d/t thick mucous, causes he to choke; nausea r/t to thick mucous.  She states energy level ca. 50% baseline.

## 2013-08-06 NOTE — Progress Notes (Signed)
Met with patient prior to her UT appt with Dr. Squire.  Pt indicated she has not had nutritional follow-up since her cancellation of 07/21/13.  She is using [facility] and must notify them in advance for after-tmt pick-up.  She stated that because of her difficulty with speech and so that she only has to call once, she calls over the weekend to arrange transport for the upcoming week.  I told her I would follow-up with Nutrition to arrange an appt next week after an RT and I will call [facility] with the extended pick-up time for that day.  She expressed appreciation.  Continuing navigation as L2 patient (treatments established).  Rick Diehl, RN, BSN, [facility] Head & Neck Oncology Navigator [phone]   

## 2013-08-09 ENCOUNTER — Encounter: Payer: Self-pay | Admitting: Radiation Oncology

## 2013-08-09 ENCOUNTER — Ambulatory Visit
Admission: RE | Admit: 2013-08-09 | Discharge: 2013-08-09 | Disposition: A | Discharge status: Home / Self Care | Source: Ambulatory Visit | Attending: Radiation Oncology | Admitting: Radiation Oncology

## 2013-08-09 VITALS — BP 122/77 | HR 74 | Temp 98.3°F | Resp 20 | Wt 133.5 lb

## 2013-08-09 DIAGNOSIS — C411 Malignant neoplasm of mandible: Secondary | ICD-10-CM

## 2013-08-09 NOTE — Progress Notes (Signed)
   Weekly Management Note:  outpatient Current Dose:  46 Gy  Projected Dose: 60 Gy   Narrative:  The patient presents for routine under treatment assessment.  CBCT/MVCT images/Port film x-rays were reviewed.  The chart was checked. Feels similar to last week. Thick saliva. Slight nausea but doesn't need new Rx for this.  Pain adequately controlled. Poor taste in mouth. Wt stable.  Physical Findings:  weight is 133 lb 8 oz (60.555 kg). Her oral temperature is 98.3 F (36.8 C). Her blood pressure is 122/77 and her pulse is 74. Her respiration is 20 and oxygen saturation is 96%.  NAD, continued white film over oral mucosa - could be mucositis.  Erythema over face/neck.  Scab over lip.  ] Vitals with Age-Percentiles 08/09/2013 08/06/2013 07/26/2013  Length     Systolic 093 818 299  Diastolic 77 82 75  Pulse 74 80 88  Respiration 20  20  Weight 60.555 kg 60.555 kg 61.19 kg  BMI     VISIT REPORT      Impression:  The patient is tolerating radiotherapy.  Plan:  Continue radiotherapy as planned.   ________________________________   Eppie Gibson, M.D.

## 2013-08-09 NOTE — Progress Notes (Signed)
Weekly rad txs, 223 so far right neck, erythema, using biafine bid, bottom lip dry and scabbed, thick copius amouts clear sputum,  osmolite 3-4  cans daily via peg, then flushes with free water before and after feedings, , slight nausea, not taking anything for that, not treated as yet, machine  Issues on lianc#4,  Fatigued much 3:02 PM

## 2013-08-10 ENCOUNTER — Ambulatory Visit
Admission: RE | Admit: 2013-08-10 | Discharge: 2013-08-10 | Disposition: A | Discharge status: Home / Self Care | Source: Ambulatory Visit | Attending: Radiation Oncology | Admitting: Radiation Oncology

## 2013-08-11 ENCOUNTER — Ambulatory Visit
Admission: RE | Admit: 2013-08-11 | Discharge: 2013-08-11 | Disposition: A | Discharge status: Home / Self Care | Source: Ambulatory Visit | Attending: Radiation Oncology | Admitting: Radiation Oncology

## 2013-08-11 ENCOUNTER — Telehealth: Payer: Self-pay | Admitting: Hematology and Oncology

## 2013-08-11 NOTE — Telephone Encounter (Signed)
s/w dtr re nutr appt for 6/18. appt added per elizabeth mckee.

## 2013-08-12 ENCOUNTER — Ambulatory Visit
Admission: RE | Admit: 2013-08-12 | Discharge: 2013-08-12 | Disposition: A | Discharge status: Home / Self Care | Source: Ambulatory Visit | Attending: Radiation Oncology | Admitting: Radiation Oncology

## 2013-08-13 ENCOUNTER — Ambulatory Visit
Admission: RE | Admit: 2013-08-13 | Discharge: 2013-08-13 | Disposition: A | Discharge status: Home / Self Care | Source: Ambulatory Visit | Attending: Radiation Oncology | Admitting: Radiation Oncology

## 2013-08-16 ENCOUNTER — Ambulatory Visit
Admission: RE | Admit: 2013-08-16 | Discharge: 2013-08-16 | Disposition: A | Discharge status: Home / Self Care | Source: Ambulatory Visit | Attending: Radiation Oncology | Admitting: Radiation Oncology

## 2013-08-16 ENCOUNTER — Other Ambulatory Visit: Payer: Self-pay | Admitting: Radiation Oncology

## 2013-08-16 ENCOUNTER — Telehealth: Payer: Self-pay | Admitting: *Deleted

## 2013-08-16 ENCOUNTER — Encounter: Payer: Self-pay | Admitting: Radiation Oncology

## 2013-08-16 ENCOUNTER — Encounter: Payer: Self-pay | Admitting: *Deleted

## 2013-08-16 VITALS — BP 107/68 | HR 107 | Temp 98.9°F | Resp 16 | Wt 129.3 lb

## 2013-08-16 DIAGNOSIS — C411 Malignant neoplasm of mandible: Secondary | ICD-10-CM

## 2013-08-16 LAB — BASIC METABOLIC PANEL (CC13)
ANION GAP: 9 meq/L (ref 3–11)
BUN: 18.2 mg/dL (ref 7.0–26.0)
CALCIUM: 9.9 mg/dL (ref 8.4–10.4)
CO2: 31 mEq/L — ABNORMAL HIGH (ref 22–29)
Chloride: 100 mEq/L (ref 98–109)
Creatinine: 0.8 mg/dL (ref 0.6–1.1)
Glucose: 131 mg/dl (ref 70–140)
Potassium: 3.8 mEq/L (ref 3.5–5.1)
SODIUM: 140 meq/L (ref 136–145)

## 2013-08-16 MED ORDER — OXYCODONE HCL 5 MG/5ML PO SOLN: 5.0000 mg | ORAL | Status: DC | PRN

## 2013-08-16 MED ORDER — GUAIFENESIN ER 600 MG PO TB12: 600.0000 mg | ORAL_TABLET | Freq: Two times a day (BID) | ORAL | Status: DC

## 2013-08-16 NOTE — Progress Notes (Signed)
Pt c/o moderate pain of her tongue, takes Oxycodone solution nightly to help her sleep. She states she does not take during the day due to making her sleepy. Pt states MMW not helpful. She is taking diflucan for thrush, reminded pt to complete entire prescription. She states it is painful to open her mouth due to sores/scabs in the corners, states she still has white patches on her tongue. She is applying Aquafor to lips.  Pt applying Biafine to skin of face/neck treatment area for bright hyperpigmentation. Pt using feeding tube, states she is only getting in 3 cans daily plus water. Advised she try to get in 4th can. Pt has appt w/nutritionist on 08/26/13.  Pt will complete treatment tomorrow, will discuss FU w/Dr Isidore Moos as she may need to be seen prior to one month.

## 2013-08-16 NOTE — Telephone Encounter (Signed)
Called patient to inform her that Dr. Isidore Moos would like her to receive 1 L of fluids.  I told her I will call her tomorrow to let her know the time, that we will try to arrange in close proximity to her final RT.  Continuing navigation as L2 patient (treatments established).  Gayleen Orem, RN, BSN, [facility] Head & Neck Oncology Navigator [phone]

## 2013-08-16 NOTE — Progress Notes (Signed)
   Weekly Management Note:  OUTpatient Current Dose:  58 Gy  Projected Dose: 60 Gy   Narrative:  The patient presents for routine under treatment assessment.  CBCT/MVCT images/Port film x-rays were reviewed.  The chart was checked. She c/o moderate pain of her tongue, takes Oxycodone solution nightly to help her sleep. She states she does not take during the day due to making her sleepy. Pt states MMW not helpful. She is taking diflucan for thrush, reminded pt to complete entire prescription. She states it is painful to open her mouth due to sores/scabs in the corners, states she still has white patches on her tongue. She is applying Aquafor to lips. Pt applying Biafine to skin of face/neck treatment area for bright hyperpigmentation. Pt using feeding tube, states she is only getting in 3 cans daily plus water.   Pt has appt w/nutritionist on 08/26/13. Gayleen Orem has called nutritionist to see if this can be moved up. She is not lightheaded. Transportation issues have complicated her ability to stay in clinic for MD evaluations and nutrition appts.   Physical Findings:  weight is 129 lb 4.8 oz (58.65 kg). Her oral temperature is 98.9 F (37.2 C). Her blood pressure is 109/76 and her pulse is 96. Her respiration is 20.  oral cavity - thick saliva, erythema, mucositis  Neck erythematous  CMP     Component Value Date/Time   NA 140 08/16/2013 1602   K 3.8 08/16/2013 1602   CO2 31* 08/16/2013 1602   GLUCOSE 131 08/16/2013 1602   BUN 18.2 08/16/2013 1602   CREATININE 0.8 08/16/2013 1602   CALCIUM 9.9 08/16/2013 1602   PROT 7.6 03/12/2013 1147   ALBUMIN 3.8 03/12/2013 1147   AST 20 03/12/2013 1147   ALT 26 03/12/2013 1147   ALKPHOS 110 03/12/2013 1147   BILITOT 0.21 03/12/2013 1147     Impression:  The patient is tolerating radiotherapy.   Plan:  Continue radiotherapy as planned. Will try mucinex for secretions.  F/u in 1 week after completion. Push PO intake as much as possible.    -----------------------------------  Eppie Gibson, MD

## 2013-08-17 ENCOUNTER — Ambulatory Visit (HOSPITAL_BASED_OUTPATIENT_CLINIC_OR_DEPARTMENT_OTHER)

## 2013-08-17 ENCOUNTER — Encounter: Payer: Self-pay | Admitting: Radiation Oncology

## 2013-08-17 ENCOUNTER — Ambulatory Visit
Admission: RE | Admit: 2013-08-17 | Discharge: 2013-08-17 | Disposition: A | Discharge status: Home / Self Care | Source: Ambulatory Visit | Attending: Radiation Oncology | Admitting: Radiation Oncology

## 2013-08-17 ENCOUNTER — Telehealth: Payer: Self-pay | Admitting: *Deleted

## 2013-08-17 VITALS — BP 109/68 | HR 82 | Temp 97.5°F | Resp 18

## 2013-08-17 DIAGNOSIS — C031 Malignant neoplasm of lower gum: Secondary | ICD-10-CM

## 2013-08-17 DIAGNOSIS — C411 Malignant neoplasm of mandible: Secondary | ICD-10-CM

## 2013-08-17 DIAGNOSIS — E860 Dehydration: Secondary | ICD-10-CM

## 2013-08-17 MED ORDER — SODIUM CHLORIDE 0.9 % IV SOLN
Freq: Once | INTRAVENOUS | Status: AC
  Administered 2013-08-17: 16:00:00 via INTRAVENOUS

## 2013-08-17 MED ORDER — BIAFINE EX EMUL
Freq: Two times a day (BID) | CUTANEOUS | Status: DC
  Administered 2013-08-17: 08:00:00 via TOPICAL

## 2013-08-17 NOTE — Patient Instructions (Signed)
Dehydration, Adult Dehydration is when you lose more fluids from the body than you take in. Vital organs like the kidneys, brain, and heart cannot function without a proper amount of fluids and salt. Any loss of fluids from the body can cause dehydration.  CAUSES   Vomiting.  Diarrhea.  Excessive sweating.  Excessive urine output.  Fever. SYMPTOMS  Mild dehydration  Thirst.  Dry lips.  Slightly dry mouth. Moderate dehydration  Very dry mouth.  Sunken eyes.  Skin does not bounce back quickly when lightly pinched and released.  Dark urine and decreased urine production.  Decreased tear production.  Headache. Severe dehydration  Very dry mouth.  Extreme thirst.  Rapid, weak pulse (more than 100 beats per minute at rest).  Cold hands and feet.  Not able to sweat in spite of heat and temperature.  Rapid breathing.  Blue lips.  Confusion and lethargy.  Difficulty being awakened.  Minimal urine production.  No tears. DIAGNOSIS  Your caregiver will diagnose dehydration based on your symptoms and your exam. Blood and urine tests will help confirm the diagnosis. The diagnostic evaluation should also identify the cause of dehydration. TREATMENT  Treatment of mild or moderate dehydration can often be done at home by increasing the amount of fluids that you drink. It is best to drink small amounts of fluid more often. Drinking too much at one time can make vomiting worse. Refer to the home care instructions below. Severe dehydration needs to be treated at the hospital where you will probably be given intravenous (IV) fluids that contain water and electrolytes. HOME CARE INSTRUCTIONS   Ask your caregiver about specific rehydration instructions.  Drink enough fluids to keep your urine clear or pale yellow.  Drink small amounts frequently if you have nausea and vomiting.  Eat as you normally do.  Avoid:  Foods or drinks high in sugar.  Carbonated  drinks.  Juice.  Extremely hot or cold fluids.  Drinks with caffeine.  Fatty, greasy foods.  Alcohol.  Tobacco.  Overeating.  Gelatin desserts.  Wash your hands well to avoid spreading bacteria and viruses.  Only take over-the-counter or prescription medicines for pain, discomfort, or fever as directed by your caregiver.  Ask your caregiver if you should continue all prescribed and over-the-counter medicines.  Keep all follow-up appointments with your caregiver. SEEK MEDICAL CARE IF:  You have abdominal pain and it increases or stays in one area (localizes).  You have a rash, stiff neck, or severe headache.  You are irritable, sleepy, or difficult to awaken.  You are weak, dizzy, or extremely thirsty. SEEK IMMEDIATE MEDICAL CARE IF:   You are unable to keep fluids down or you get worse despite treatment.  You have frequent episodes of vomiting or diarrhea.  You have blood or green matter (bile) in your vomit.  You have blood in your stool or your stool looks black and tarry.  You have not urinated in 6 to 8 hours, or you have only urinated a small amount of very dark urine.  You have a fever.  You faint. MAKE SURE YOU:   Understand these instructions.  Will watch your condition.  Will get help right away if you are not doing well or get worse. Document Released: 02/25/2005 Document Revised: 05/20/2011 Document Reviewed: 10/15/2010 ExitCare Patient Information 2014 [employer].  

## 2013-08-17 NOTE — Telephone Encounter (Signed)
Called patient to inform of her 3:30 appt today for fluids and expectation that administration will take about 2 hours.  She verbalized understanding and stated she has arranged for an another option to [facility] for a ride home.  Gayleen Orem, RN, BSN, [facility] Head & Neck Oncology Navigator [phone]

## 2013-08-18 NOTE — Progress Notes (Signed)
  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 She developed thrush, odynophagia, skin irritation, weight loss. Supportive medical care and nutrition consultations were provided.  Transportation was difficult for her but she managed to attend treatments with minimal missed fractions. She used her PEG tube for nutrition.  Lab work ruled out significant dehydration.  Plan: The patient has completed radiation treatment. The patient will return to radiation oncology clinic for routine followup in one week. I advised them to call or return sooner if they have any questions or concerns related to their recovery or treatment.  -----------------------------------  Eppie Gibson, MD

## 2013-08-20 ENCOUNTER — Telehealth: Payer: Self-pay | Admitting: Hematology and Oncology

## 2013-08-20 NOTE — Telephone Encounter (Signed)
s/w pt re nutr appt for 6/17. schedule per covering nutr

## 2013-08-24 ENCOUNTER — Encounter: Payer: Self-pay | Admitting: Radiation Oncology

## 2013-08-25 ENCOUNTER — Encounter: Payer: Self-pay | Admitting: Radiation Oncology

## 2013-08-25 ENCOUNTER — Ambulatory Visit
Admission: RE | Admit: 2013-08-25 | Discharge: 2013-08-25 | Disposition: A | Discharge status: Home / Self Care | Source: Ambulatory Visit | Attending: Radiation Oncology | Admitting: Radiation Oncology

## 2013-08-25 ENCOUNTER — Ambulatory Visit: Payer: TRICARE For Life (TFL) | Admitting: Nutrition

## 2013-08-25 VITALS — BP 125/85 | HR 87 | Temp 99.0°F | Ht 60.0 in | Wt 126.5 lb

## 2013-08-25 DIAGNOSIS — C411 Malignant neoplasm of mandible: Secondary | ICD-10-CM

## 2013-08-25 HISTORY — DX: Personal history of irradiation: Z92.3

## 2013-08-25 MED ORDER — OSMOLITE 1.5 CAL PO LIQD: ORAL | Status: DC

## 2013-08-25 NOTE — Progress Notes (Signed)
Initial Nutrition Consult  63 year old female diagnosed with mandibular alveolar ridge ca in Sept 2014.  She is a patient of Dr. Isidore Moos. Patient has completed radiation therapy.  PMH includes tobacco and mouth pain  Medications include prozac, miralax, phenergan.  Labs were reviewed,.  Height:  60 inches. Weight: 126.5 pounds. UBW: 165 pounds. BMI:24.71  Pt is unable to eat by mouth or drink fluids.  She has a PEG and has been instilling 3 cans of osmolite 1.5 TID with 30 ml free water before and 180 ml free water after each bolus feeding.  Patient is willing to increase TF to meet needs for healing and weight stability. History on nausea and vomiting per MD.  Patient meets criteria for severe protein calorie malnutrition in the context of chronic illness as evidenced by 23 % weight loss from usual body weight in 6 months and less than 75% energy needs for greater than one month.  Estimated Nutrition needs:  1900-2100 calories, 85-100 grams protein, 2 L fluid.  Nutrition Diagnosis:  Inadequate enteral nutrition related to poor tolerance as evidenced by TF meeting approximately 50% needs.  Intervention:  Increase Osmolite 1.5 to 1.5 cans 4 times daily with 130 ml free water before and after each bolus feeding. Patient educated on TF increase.  Written instructions provided.  Teach back method used.  Monitoring, Evaluation, Goals:  Patient will tolerate increase in TF to meet estimated needs for weight stability and healing.  Next Visit:  Will schedule.

## 2013-08-25 NOTE — Progress Notes (Signed)
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  She reports pain is worse in mouth/lip since completion of RT.  Has uncontrolled nausea.  But, only taking her oxycodone once a day and not taking her phenergan.  She reports crying yesterday, largely due to concerns for her daughter who is having housing and financial problems.  The patient has been referred back to social work.       Temp 99.0 today and she reports diarrhea on yesterday with 2 episodes - small amounts. None today.   Seen by Dory Peru, Dietician today.                          ALLERGIES:  has No Known Allergies.  Meds: Current Outpatient Prescriptions  Medication Sig Dispense Refill  . Alum & Mag Hydroxide-Simeth (MAGIC MOUTHWASH W/LIDOCAINE) SOLN 1 part benadryl, 1 part nystatin, 1 part cherry extra strength Maalox Plus, 3 parts 2% viscous lidocaine. Swish +/- swallow 11mL up to QID.  480 mL  5  . emollient (BIAFINE) cream Apply topically 2 (two) times daily.      . fluconazole (DIFLUCAN) 100 MG tablet Take 2 tabs today, then 1 tab daily x 20 more days  22 tablet  0  . FLUoxetine (PROZAC) 10 MG capsule Take 10 mg by mouth daily.      Marland Kitchen oxyCODONE (ROXICODONE) 5 MG/5ML solution Place 5 mLs (5 mg total) into feeding tube every 4 (four) hours as needed for severe pain.  500 mL  0  .  polyethylene glycol (MIRALAX) packet Take 17 g by mouth daily.  14 each  0  . promethazine (PHENERGAN) 25 MG tablet Take 1 tablet (25 mg total) by mouth every 6 (six) hours as needed for nausea or vomiting.  30 tablet  0  . guaiFENesin (MUCINEX) 600 MG 12 hr tablet Take 1 tablet (600 mg total) by mouth 2 (two) times daily. To help secretions.  30 tablet  3  . Nutritional Supplements (FEEDING SUPPLEMENT, OSMOLITE 1.5 CAL,) LIQD Increase bolus TF of Osmolite 1.5 to 1.5 cans 4 times daily with 130 ml free water before and after each feeding.  1422 mL  0   No current facility-administered medications for this encounter.    Physical Findings: The patient is in no acute distress. Patient is alert and oriented. Oral mucosa - moist, intact, no thrush. Skin intact and pink in RT fields. Post operative swelling continues over face.  Vitals with Age-Percentiles 08/25/2013 08/25/2013  Length  694.8 cm  Systolic 546 270  Diastolic 85 87  Pulse 87 89  Respiration    Weight  57.38 kg  BMI  24.8  VISIT REPORT     Lab Findings: Lab Results  Component Value Date   WBC 11.2* 03/12/2013  HGB 13.8 03/12/2013   HCT 41.3 03/12/2013   MCV 86.7 03/12/2013   PLT 312 03/12/2013    No results found for this basename: TSH    Radiographic Findings: No results found.  Impression/Plan:    1) Head and Neck Cancer Status: healing from RT  2) Nutritional Status: she has not been staying at past appts to see our nutritionist due to social pressures/transportation. But, she saw Dory Peru today. - weight: still falling - PEG tube: instructed by Raford Pitcher on how to take 1.5 cans QID to total 6 cans  3) Risk Factors: The patient has been educated about risk factors including alcohol and tobacco abuse; they understand that avoidance of alcohol and tobacco is important to prevent recurrences as well as other cancers  4) Swallowing: will escalate diet once she heals more.   5) Dental: Encouraged to continue regular followup  with dentistry, and dental hygiene including fluoride rinses.   6) Energy: Thyroid gland did not receive significant dose and therefore I will not be screening with TSHs regularly.  Energy should improve once healing sets in more.  7) Social: social work Financial trader.  Numerous stressors - financial, family, depression, etc  8) Other: instructed on how to take pain meds, antiemetics, to address symptoms.  I think her frustration is interfering with compliance.  Empathy provided. I think she was receptive to our discussion.  9) Follow-up in 2-3 weeks. The patient was encouraged to call with any issues or questions before then.  _____________________________________   Eppie Gibson, MD

## 2013-08-25 NOTE — Progress Notes (Addendum)
Ms. Mcmorris here today for reassessment s/p radiation therapy to her for Squamous Cell Carcinoma of the [place].  Note erythema of the right jaw and face with swelling.  She states she was crying plast pm due to pain in her tongue and mouth.  Today she grades her pain as a level 7.  Note redness of the inside of her right lower lip and right lateral tongue. Intake of ~ 3 ensures daily and has lost 3 lbs since 08/16/13, but overall she has lost 39 lbs since 07/12/13.    Admits to feeling depressed, but does not want counseling today.  Referral to Social work. Taking Prozac presently.   Temp 99.0 today and she reports diarrhea on yesterday with 2 episodes - small amounts.  None today  Seen by Dory Peru, Dietician today

## 2013-08-26 ENCOUNTER — Encounter: Admitting: Nutrition

## 2013-08-27 ENCOUNTER — Telehealth: Payer: Self-pay | Admitting: Nutrition

## 2013-08-27 NOTE — Telephone Encounter (Signed)
Patient returned telephone call.  She reports that she did have vomiting after the first Tube feeding because she brushed her teeth immediately after which caused her to gag and vomit.  She has now moved the time of her first feeding to later on in the morning and has had no trouble tolerating Osmolite 1.5 - 4 times a day with 130 mL free water before and after each bolus feeding.  Patient reports she feels better and has a bit more energy.  No other nutrition needs identified.  Patient educated to continue Osmolite 1.5 - 6 cans daily, to meet 100% of estimated nutrition needs.

## 2013-08-27 NOTE — Progress Notes (Signed)
Met with patient during weekly UT with Dr. Isidore Moos to provide support, encouragement and care continuity.  Gayleen Orem, RN, BSN, [facility] Head & Neck Oncology Navigator [phone]

## 2013-08-27 NOTE — Telephone Encounter (Signed)
Called patient to follow up on TF change.  No answer.  Left message to return call.

## 2013-09-08 ENCOUNTER — Encounter: Payer: Self-pay | Admitting: *Deleted

## 2013-09-08 ENCOUNTER — Ambulatory Visit
Admission: RE | Admit: 2013-09-08 | Discharge: 2013-09-08 | Disposition: A | Discharge status: Home / Self Care | Source: Ambulatory Visit | Attending: Radiation Oncology | Admitting: Radiation Oncology

## 2013-09-08 ENCOUNTER — Encounter: Payer: Self-pay | Admitting: Radiation Oncology

## 2013-09-08 VITALS — BP 131/82 | HR 102 | Temp 98.7°F | Ht 61.0 in | Wt 130.9 lb

## 2013-09-08 DIAGNOSIS — C411 Malignant neoplasm of mandible: Secondary | ICD-10-CM

## 2013-09-08 NOTE — Progress Notes (Signed)
Theresa Stephens here today for reassessment s/p radiation therapy to her right alveolar ridge.  She contiunes to have soreness of her right, lateral tongue.  She reports that she stooped Magic Mouthwash because she felt it was not helping and felt that it contributed to the crusting she had in the roof of her mouth, but since stopping the crusting has resolved.  She is currently using Biotene mouthwash which has helped clear secretions and decrease her episodes of coughing. The skin in the treatment field is mildly red on the right, but is intact and soft.  She reports an increase in her energy level, but admits periods of feeling down.  She has gained ~ 4 lbs since her last weight and her sole source of nutrition is 6 cans of Osmolite daily..  She states that most fluids she drinks taste bad.  She has periods of dry heaves with nausea and vomiting and admits to instill 1 1/2 to 2 cans of Osmolite at each feeding.  Suggested to decrease the amount during each feeding and instill enteral nutrition at more frequent intervals.

## 2013-09-08 NOTE — Progress Notes (Signed)
To provide support, encouragement and care continuity, joined patient during follow-up appt with Dr. Isidore Moos.  Supported patient in her progress since completion of RT.  She acknowledged physical improvements but verbalized that "last week was not good".  She explained further that she has been dealing with emotional issues re: to her dtr's death, grandson's suicide, her mother's death (recent history all), current living conditions, her other dtr's current problems.  She was tearful in sharing this information.  I recognized that Polo Riley, LCSW, had reached out to her recently; patient stated she had "second thoughts" and decided not to meet with Lauren.  I encouraged her to avail herself to the support that can be offered by Ander Purpura and chaplain Epifania Gore, that she did not have to deal with past and present situations alone.  She stated "I've never had to ask anyone for help, I've always managed on my own".  With additional encouragement from Dr. Isidore Moos, she agreed that I could ask Lauren and Ubaldo Glassing to reach out to her which I am doing via routing of this note.    Gayleen Orem, RN, BSN, [facility] Head & Neck Oncology Navigator [phone]

## 2013-09-10 ENCOUNTER — Encounter: Payer: Self-pay | Admitting: Radiation Oncology

## 2013-09-10 NOTE — Progress Notes (Signed)
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 She contiunes to have soreness of her right, lateral tongue but this is much better. She reports that she stopped Magic Mouthwash because she felt it was not helping and felt that it contributed to the crusting she had in the roof of her mouth, but since stopping the crusting has resolved. She is currently using Biotene mouthwash which has helped clear secretions and decrease her episodes of coughing. She reports an increase in her energy level, but admits periods of feeling down. She has gained ~ 4 lbs since her last weight and her sole source of nutrition is 6 cans of Osmolite daily.. She states that most fluids she drinks taste bad. She has periods of dry heaves and admits to instill 1 1/2 to 2 cans of Osmolite at each feeding. Suggested to decrease the amount during each feeding and instill enteral nutrition at more frequent intervals. She denies nausea as the cause of the dry heaves.  Overall, physically, she feels she is improving sigificantly.   Most of the encounter was spent discussing emotional factors related to the death of her daughter prior to her own diagnosis.  She has had other very traumatic experiences in the past that continue to affect her deeply, but she has hesitated to get support/help/counseling from others.       ALLERGIES:  has No Known Allergies.  Meds: Current Outpatient Prescriptions  Medication Sig Dispense Refill  . emollient (BIAFINE) cream Apply topically 2 (two) times daily.      Marland Kitchen FLUoxetine (PROZAC) 10 MG capsule Take 10 mg by mouth daily.      Marland Kitchen guaiFENesin (MUCINEX) 600 MG 12 hr tablet Take 1 tablet (600 mg total) by mouth 2 (two) times daily. To help secretions.  30 tablet  3  . Nutritional Supplements (FEEDING SUPPLEMENT, OSMOLITE 1.5 CAL,) LIQD Increase bolus TF of Osmolite 1.5 to 1.5 cans 4 times daily with 130 ml free water before and after each feeding.  1422 mL  0  . oxyCODONE (ROXICODONE) 5 MG/5ML solution Place 5 mLs (5 mg total) into feeding tube every 4 (four) hours as needed for severe pain.  500 mL  0  . polyethylene glycol (MIRALAX) packet Take 17 g by mouth daily.  14 each  0  . promethazine (PHENERGAN) 25 MG tablet Take 1 tablet (25 mg total) by mouth every 6 (six) hours as needed for nausea or vomiting.  30 tablet  0   No current facility-administered medications for this encounter.    Physical Findings: The patient is in no acute distress. Patient is alert and oriented.  height is 5\' 1"  (1.549 m) and weight is 130 lb 14.4 oz (59.376 kg). Her temperature is 98.7 F (37.1 C). Her blood pressure is 131/82 and her pulse is 102. . The skin in the treatment field is mildly red on the  right, but is intact and soft. Oral mucosa is moist, intact, no thrush, no signs of tumor (though some trismus limits inspection)  Lab Findings: Lab Results  Component Value Date   WBC 11.2* 03/12/2013   HGB 13.8 03/12/2013   HCT 41.3 03/12/2013   MCV 86.7 03/12/2013   PLT 312 03/12/2013    No results found for this basename: TSH    Radiographic Findings: No results found.  Impression/Plan:    1) Head and Neck Cancer Status: healing from RT  2) Nutritional Status:improving with guidance from Dory Peru of Nutrition - weight: rising - PEG tube: still using with some PO intake, but  taste is very poor, still  3) Risk Factors: The patient has been educated about risk factors including alcohol and tobacco abuse; they understand that avoidance of alcohol and tobacco is important to prevent recurrences as well as other cancers. She quit smoking in 02/2013  4) Swallowing: swallowing liquids.  5) Dental: Encouraged to continue regular followup with dentistry, and dental hygiene including fluoride rinses.   6) Energy: improving; Thyroid function should not be affect by RT due to the fact that the fields didn't overlap the thyroid gland  7) Social: emotional distress as above. Referred again to social work, and to our chaplain. She is now amenable to getting help, which is a big step forward for her overall healing from past traumas.  8) Follow-up in 2 months with CT of neck and chest. The patient was encouraged to call with any issues or questions before then.  _____________________________________   Eppie Gibson, MD

## 2013-09-13 ENCOUNTER — Telehealth: Payer: Self-pay | Admitting: *Deleted

## 2013-09-13 ENCOUNTER — Encounter: Payer: Self-pay | Admitting: *Deleted

## 2013-09-13 NOTE — Progress Notes (Signed)
[facility] Psychosocial Distress Screening Clinical Social Work  Clinical Social Work was referred by distress screening protocol.  The patient scored a 7 on the Psychosocial Distress Thermometer which indicates severe distress. Clinical Social Worker phoned pt to assess for distress and other psychosocial needs. CSW had to leave a message as pt not currently available.   ONCBCN DISTRESS SCREENING 08/25/2013  Screening Type Change in Status  Mark the number that describes how much distress you have been experiencing in the past week 7  Emotional problem type Depression  Physical Problem type Pain;Talking  Physician notified of physical symptoms Yes  Referral to clinical social work Yes    Clinical Social Worker follow up needed: Yes.    If yes, follow up plan: CSW awaits return call.   Loren Racer, LCSW Clinical Social Worker [facility] [place] Wednesday, Thursday and Friday Phone: [phone] Fax: [phone]

## 2013-09-13 NOTE — Telephone Encounter (Signed)
CALLED PATIENT TO INFORM OF LAB, TEST AND FU FOR 12-31-13, LVM FOR A RETURN CALL

## 2013-09-15 ENCOUNTER — Encounter: Payer: Self-pay | Admitting: *Deleted

## 2013-09-15 NOTE — Progress Notes (Signed)
[place] Work  Clinical Social Work was referred by patient navigator for assessment of psychosocial needs.  CSW recently attempted to support patient in past, patient was not interested at that time.  Patient navigator, Gayleen Orem, shared patient is open to receiving emotional support.  CSW Hock left voicemail for patient on 08/14/13 to follow up from distress screen.  CSW left voicemail today encouraging patient to contact CSW when convenient.   Polo Riley, MSW, LCSW, OSW-C Clinical Social Worker [facility] [phone]

## 2013-10-29 ENCOUNTER — Telehealth: Payer: Self-pay | Admitting: *Deleted

## 2013-10-29 NOTE — Telephone Encounter (Addendum)
Called patient to check on her well being and to identify any needs.  She reported: 1)  Not eating much by mouth d/t  a)  "infection in my mouth, food tastes terrible; it's especially thick in the morning".  She stated she has been prescribed fluconazole in past.  b)  swallowing is difficult b/c "my tongue is still swollen, curled up, folded in half".  She reported having an appt with Dr. Vicie Mutters s/p her last appt with Dr. Isidore Moos and he indicated he was pleased with her progress, maximum healing will take almost a year.   2)  She continues to use her PEG, instills an average of 5 cans/day.  She believes her weight is stable. 3)  She stated she is receptive to contact from SW.  Previous offers came when "too much was going on". 4)  In response to my remark that her speech sounded clearer, she replied that she has been spending more time with her grandson and is working on her enunciation so he better understands her. I told her I would share this information with Dr. Isidore Moos and other members of her Care Team.  She expressed appreciation.  Gayleen Orem, RN, BSN, [place] at [place] [phone]

## 2013-11-02 ENCOUNTER — Encounter: Payer: Self-pay | Admitting: Radiation Oncology

## 2013-11-02 NOTE — Progress Notes (Signed)
Returned call to patient to discuss mouth hygiene today.  She feels her mouth has a persistent film, poor taste.  This hasn't improved with anti fungal Rx in the past.  Discussed baking soda rinses, biotene, scraping tongue and buccal mucosa, FOM, with soft tooth brush bristles.  She will call Dr. Waynard Edwards nurse for other ideas, as well.  She appreciated my call. -----------------------------------  Theresa Gibson, MD

## 2013-11-08 ENCOUNTER — Telehealth: Payer: Self-pay

## 2013-11-08 NOTE — Telephone Encounter (Signed)
Called and left a voicemail to see if patient is interested in counseling.   [facility] Counseling Intern  [phone]

## 2013-11-12 ENCOUNTER — Telehealth: Payer: Self-pay | Admitting: *Deleted

## 2013-11-12 NOTE — Telephone Encounter (Signed)
Called patient to ask about coming for a nutrition appt. On 11-18-13 @ 11:15 am, lvm for a return call

## 2013-11-16 ENCOUNTER — Telehealth: Payer: Self-pay | Admitting: *Deleted

## 2013-11-16 NOTE — Telephone Encounter (Addendum)
Returned Emaree's VM: 1)  She requested clarification about an appt this week. I indicated the appt is with Dory Peru, RD, @ 1115.  In response to my inquiry, she reported that she "still is not eating"; I responded that it is very important she keep this appt to which she agreed.  2)  I observed that the [place] Intern had LVM on 11/08/13 offering support.  Alezandra replied that she did not receive the VM.  She stated she is still receptive to support; I suggested that I would try to arrange an appt after she meets with nutritionist Thursday; she verbalized agreement. To this end, I notified via VM both [facility], Chaplain Intern, and [employer], [place].  3)  She stated she has an appt with Dr. Vicie Mutters on 12/30/13. Continuing to navigate as L3 (treatments completed) patient.   Gayleen Orem, RN, BSN, [place] at [place] [phone]

## 2013-11-17 ENCOUNTER — Telehealth: Payer: Self-pay

## 2013-11-17 NOTE — Telephone Encounter (Signed)
Counselor called to schedule intake session for 11/24/13.  [facility] Counseling Intern [phone]

## 2013-11-18 ENCOUNTER — Ambulatory Visit: Payer: TRICARE For Life (TFL) | Admitting: Nutrition

## 2013-11-18 ENCOUNTER — Encounter: Payer: Self-pay | Admitting: Specialist

## 2013-11-18 NOTE — Progress Notes (Signed)
Theresa Stephens, RD, brought the patient by my office after seeing her. The patient talked about her life long struggle with depression. She was upbeat today because she is on the other side of her treatments and is anticipating celebrating with her children. The only issue she said she is struggling with at the moment is not being able to eat and she wants to eat.   She also talked about how the cancer had been a "gift" in that she had always been afraid to talk to people about her depression. When she went into treatment, her struggle came out into the open and she got help. She also discovered that others in her family are depressed and one of her children has a diagnosis as bipolar. None of her family would talk about the [place] problems they struggle with because of the shame. Now they talk and seek help.  She said, "I'm not crazy.  I just needed help." She is actually looking forward to her counseling session with Bonnita Nasuti, the [facility] intern.  They have an appointment set up for next week.    Epifania Gore, Theresa Stephens, [place]

## 2013-11-18 NOTE — Progress Notes (Signed)
Nutrition followup completed with patient.  Patient has history of mandibular alveolar ridge cancer, diagnosed in September 2014.  Patient completed radiation therapy.  Patient reports she is utilizing 4-5 cans of Osmolite 1.5 daily, via feeding tube.  Patient uses a lot of water to flush her PEG.  States she often flushes with a total of 16 ounces of water.  Patient reports she does sometimes feel too full and is not able to tolerate more than one can of Osmolite 1.5 at a time.    Patient's weight is stable and documented as 130.8 pounds.    Patient complains of taste alterations.  She is unable to tolerate anything by mouth.  She reports difficulty manipulating food.  Patient states "my tongue is folded over."  Patient also reports difficulty lifting her arm above her head.  She reports this is the arm where "tissue was removed to create a flap" in her mouth and she is limited in movement.  Patient states her trach was removed.  Revised estimated nutrition needs: 1675-1875 calories, 65-80 g protein, 1.8 L fluid.  Osmolite 1.5 providing between 1420 calories and 1775 calories, and 59.6 g protein and 74.5 g protein.  Nutrition diagnosis: Inadequate oral intake related to history of mandibular alveolar ridge cancer as evidenced by patient's need for feeding tube to provide 100% of needs.  Intervention: Patient educated to increase Osmolite 1.5 to 5 cans daily, via feeding tube.  Patient should give one can every 3 hours.  Discouraged patient from flushing feeding tube with more than 16 ounces of water at a time. Educated patient on strategies for beginning increased oral intake of fluids as tolerated and safe.   Recommend patient be referred to speech therapy to ensure patient safe for oral diet. Patient requesting referral for physical therapy.   I've communicated request to head and neck navigator.  Monitoring, evaluation, goals: Patient will tolerate 5 cans of Osmolite 1.5 to provide greater  than 90% of estimated nutrition needs.  Goal is weight maintenance.  Next visit: Patient to contact me for questions or concerns.  Patient has my contact information.  **Disclaimer: This note was dictated with voice recognition software. Similar sounding words can inadvertently be transcribed and this note may contain transcription errors which may not have been corrected upon publication of note.**

## 2013-11-19 ENCOUNTER — Other Ambulatory Visit: Payer: Self-pay | Admitting: Radiation Oncology

## 2013-11-19 DIAGNOSIS — C411 Malignant neoplasm of mandible: Secondary | ICD-10-CM

## 2013-11-24 ENCOUNTER — Other Ambulatory Visit (HOSPITAL_COMMUNITY): Payer: Self-pay | Admitting: Radiation Oncology

## 2013-11-24 DIAGNOSIS — R1310 Dysphagia, unspecified: Secondary | ICD-10-CM

## 2013-11-24 NOTE — Progress Notes (Unsigned)
Intake session with patient. Patient was eager to discuss the psychological and emotional implications of her diagnosis and treatment experience.   [facility]  Counseling Intern  [phone]

## 2013-12-01 ENCOUNTER — Ambulatory Visit: Payer: TRICARE For Life (TFL) | Attending: Radiation Oncology | Admitting: Physical Therapy

## 2013-12-01 DIAGNOSIS — M24519 Contracture, unspecified shoulder: Secondary | ICD-10-CM | POA: Insufficient documentation

## 2013-12-01 DIAGNOSIS — M25519 Pain in unspecified shoulder: Secondary | ICD-10-CM | POA: Insufficient documentation

## 2013-12-01 DIAGNOSIS — IMO0001 Reserved for inherently not codable concepts without codable children: Secondary | ICD-10-CM | POA: Insufficient documentation

## 2013-12-01 NOTE — Progress Notes (Signed)
Second session with patient. Patient was eager to discuss the psychological and emotional implications of her diagnosis and treatment experience. Patient consulted with social worker about assistance with transportation.  [facility]  Counseling Intern  [phone]

## 2013-12-02 ENCOUNTER — Ambulatory Visit (HOSPITAL_COMMUNITY): Admission: RE | Admit: 2013-12-02 | Source: Ambulatory Visit

## 2013-12-02 ENCOUNTER — Other Ambulatory Visit (HOSPITAL_COMMUNITY)

## 2013-12-06 ENCOUNTER — Telehealth: Payer: Self-pay | Admitting: *Deleted

## 2013-12-06 NOTE — Telephone Encounter (Signed)
CALLED PATIENT TO INFORM OF APPTS. AND TESTS THAT HAVE BEEN ARRANGED FOR HER, LVM FOR A RETURN CALL

## 2013-12-07 ENCOUNTER — Ambulatory Visit: Payer: TRICARE For Life (TFL) | Admitting: Physical Therapy

## 2013-12-07 DIAGNOSIS — IMO0001 Reserved for inherently not codable concepts without codable children: Secondary | ICD-10-CM | POA: Diagnosis not present

## 2013-12-08 ENCOUNTER — Ambulatory Visit: Payer: TRICARE For Life (TFL)

## 2013-12-08 ENCOUNTER — Ambulatory Visit: Payer: TRICARE For Life (TFL) | Admitting: Physical Therapy

## 2013-12-09 ENCOUNTER — Encounter: Admitting: Physical Therapy

## 2013-12-13 ENCOUNTER — Ambulatory Visit: Payer: Medicare Other | Attending: Radiation Oncology | Admitting: Physical Therapy

## 2013-12-13 DIAGNOSIS — M24511 Contracture, right shoulder: Secondary | ICD-10-CM | POA: Diagnosis not present

## 2013-12-13 DIAGNOSIS — M25511 Pain in right shoulder: Secondary | ICD-10-CM | POA: Diagnosis not present

## 2013-12-13 DIAGNOSIS — Z5189 Encounter for other specified aftercare: Secondary | ICD-10-CM | POA: Insufficient documentation

## 2013-12-14 ENCOUNTER — Ambulatory Visit (HOSPITAL_COMMUNITY): Payer: TRICARE For Life (TFL)

## 2013-12-14 ENCOUNTER — Ambulatory Visit (HOSPITAL_COMMUNITY): Admission: RE | Admit: 2013-12-14 | Payer: TRICARE For Life (TFL) | Source: Ambulatory Visit

## 2013-12-16 ENCOUNTER — Ambulatory Visit: Payer: Medicare Other | Admitting: Physical Therapy

## 2013-12-16 DIAGNOSIS — Z5189 Encounter for other specified aftercare: Secondary | ICD-10-CM | POA: Diagnosis not present

## 2013-12-20 ENCOUNTER — Ambulatory Visit: Payer: Medicare Other | Admitting: Physical Therapy

## 2013-12-20 DIAGNOSIS — Z5189 Encounter for other specified aftercare: Secondary | ICD-10-CM | POA: Diagnosis not present

## 2013-12-23 ENCOUNTER — Ambulatory Visit: Payer: Medicare Other | Admitting: Physical Therapy

## 2013-12-23 DIAGNOSIS — Z5189 Encounter for other specified aftercare: Secondary | ICD-10-CM | POA: Diagnosis not present

## 2013-12-27 ENCOUNTER — Ambulatory Visit: Payer: Medicare Other | Admitting: Physical Therapy

## 2013-12-27 DIAGNOSIS — Z5189 Encounter for other specified aftercare: Secondary | ICD-10-CM | POA: Diagnosis not present

## 2013-12-28 ENCOUNTER — Encounter: Admitting: Physical Therapy

## 2013-12-29 ENCOUNTER — Ambulatory Visit: Payer: Medicare Other | Admitting: Physical Therapy

## 2013-12-29 DIAGNOSIS — Z5189 Encounter for other specified aftercare: Secondary | ICD-10-CM | POA: Diagnosis not present

## 2013-12-30 ENCOUNTER — Encounter: Admitting: Physical Therapy

## 2013-12-31 ENCOUNTER — Ambulatory Visit (HOSPITAL_COMMUNITY)
Admission: RE | Admit: 2013-12-31 | Discharge: 2013-12-31 | Disposition: A | Discharge status: Home / Self Care | Payer: Medicare Other | Source: Ambulatory Visit | Attending: Radiation Oncology | Admitting: Radiation Oncology

## 2013-12-31 ENCOUNTER — Ambulatory Visit
Admission: RE | Admit: 2013-12-31 | Discharge: 2013-12-31 | Disposition: A | Discharge status: Home / Self Care | Payer: Medicare Other | Source: Ambulatory Visit | Attending: Radiation Oncology | Admitting: Radiation Oncology

## 2013-12-31 ENCOUNTER — Ambulatory Visit (HOSPITAL_COMMUNITY)

## 2013-12-31 ENCOUNTER — Encounter (HOSPITAL_COMMUNITY): Payer: Self-pay

## 2013-12-31 ENCOUNTER — Encounter: Payer: Self-pay | Admitting: Radiation Oncology

## 2013-12-31 VITALS — BP 129/74 | HR 75 | Temp 98.5°F | Ht 61.0 in | Wt 130.1 lb

## 2013-12-31 DIAGNOSIS — Z923 Personal history of irradiation: Secondary | ICD-10-CM | POA: Diagnosis not present

## 2013-12-31 DIAGNOSIS — C411 Malignant neoplasm of mandible: Secondary | ICD-10-CM | POA: Insufficient documentation

## 2013-12-31 DIAGNOSIS — R918 Other nonspecific abnormal finding of lung field: Secondary | ICD-10-CM | POA: Diagnosis not present

## 2013-12-31 DIAGNOSIS — K148 Other diseases of tongue: Secondary | ICD-10-CM | POA: Insufficient documentation

## 2013-12-31 DIAGNOSIS — C4492 Squamous cell carcinoma of skin, unspecified: Secondary | ICD-10-CM | POA: Diagnosis not present

## 2013-12-31 DIAGNOSIS — E079 Disorder of thyroid, unspecified: Secondary | ICD-10-CM

## 2013-12-31 LAB — BUN AND CREATININE (CC13)
BUN: 14.2 mg/dL (ref 7.0–26.0)
CREATININE: 0.7 mg/dL (ref 0.6–1.1)

## 2013-12-31 MED ORDER — IOHEXOL 300 MG/ML  SOLN
100.0000 mL | Freq: Once | INTRAMUSCULAR | Status: AC | PRN
  Administered 2013-12-31: 100 mL via INTRAVENOUS

## 2013-12-31 NOTE — Progress Notes (Signed)
Theresa Stephens is here for assessment S/pa radiation for Sq. Cell Ca. of the right [place].  She continues to use Osmolite as her main source of nutrition - average of 4 cans daily.  She is eating watered down cream of wheat, cream soups, ice cream and drinks yogurt. She states she is beginning to taste food. She C/O mild soreness of her right tongue region and accidentally bites the tip periodically. C/O soreness and pain in right face and neck  As a level 3/10 .  Her oral mucosa is intact, pink and moist.  Currently under the care of physical therapy BID due to atrophy of her right shoulder.

## 2013-12-31 NOTE — Progress Notes (Signed)
Radiation Oncology         [phone]) [phone] ________________________________  Name: Theresa Stephens MRN: [id number]  Date: 12/31/2013  DOB: 1950-05-17  Follow-Up Visit Note  CC: JAMES D BROWNE     Diagnosis and Prior Radiotherapy:       ICD-9-CM ICD-10-CM  1. 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     She continues to use Osmolite as her main source of nutrition - average of 4 cans daily. She is eating watered down cream of wheat, cream soups, ice cream and drinks yogurt. Still using PEG tube. She states she is beginning to taste food. She C/O mild soreness of her right tongue region and accidentally bites the tip periodically. C/O soreness and pain in right face and neck As a level 3/10 .  Currently under the care of physical therapy BID due to atrophy of her right shoulder. Missed her appt with Dr. Vicie Mutters this week, so rescheduled for 11/20.  Received counseling from chaplain intern.                  Weight stable today.  Dry mouth at times.  ALLERGIES:  has No Known Allergies.  Meds: Current Outpatient Prescriptions  Medication Sig Dispense Refill  . FLUoxetine (PROZAC) 10 MG capsule Take 10 mg by mouth daily. Dr. Marijo Conception [facility]      . guaiFENesin (MUCINEX) 600 MG 12 hr tablet Take 1 tablet (600 mg total) by mouth 2 (two) times daily. To help secretions.  30 tablet  3  . Nutritional Supplements (FEEDING SUPPLEMENT, OSMOLITE 1.5 CAL,) LIQD Increase bolus TF of Osmolite 1.5 to 1.5 cans 4 times daily with 130 ml free  water before and after each feeding.  1422 mL  0  . polyethylene glycol (MIRALAX) packet Take 17 g by mouth daily.  14 each  0  . sodium fluoride (DENTAGEL) 1.1 % GEL dental gel Place 1 application onto teeth at bedtime.       No current facility-administered medications for this encounter.    Physical Findings: The patient is in no acute distress. Patient is alert and oriented.  height is 5\' 1"  (1.549 m) and weight is 130 lb 1.6 oz (59.013 kg). Her temperature is 98.5 F (36.9 C). Her blood pressure is 129/74 and her pulse is 75. Marland Kitchen Oropharyngeal mucosa is intact with no thrush or lesions. No palpable cervical or supraclavicular lymphadenopathy. Skin intact and smooth over neck.  Postoperative changes.  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 Radiation complete.  EXAM: CT NECK WITH CONTRAST  TECHNIQUE: Multidetector CT imaging of the neck was performed using the standard protocol following the bolus administration of intravenous contrast.  CONTRAST:  157mL OMNIPAQUE IOHEXOL 300 MG/ML  SOLN  COMPARISON:  PET-CT 03/19/2013  FINDINGS: Interval surgical resection of the right mandible. There is a bone graft from the symphysis to the angle of mandible with surgical plate fixation. Bone graft appears satisfactory in position without evidence of infection or tumor. Large tissue graft containing fat is present at the surgical site. There is a low-density area throughout the right tongue which may be an area of necrosis or atrophy. This has complex fluid signal characteristics. No recurrent soft tissue mass is seen in the operative field.  Negative for pathologic adenopathy in the neck.  The tonsils are  symmetric.  Larynx is normal.  Resection of the right submandibular gland. Radiation change in the left submandibular gland and right parotid gland with hyper enhancement. Left parotid gland normal.  21 x 14 mm mass in the right thyroid lobe which shows enhancement. This was not hyper metabolic on the prior PET scan however ultrasound-guided biopsy is suggested to exclude a neoplasm in the right thyroid lobe. Left thyroid lobe is normal.  Chest CT findings from today are reported separately.  IMPRESSION: Postop resection body the mandible on the right with a large fat graft in the surgical bed. No recurrent tumor or adenopathy is identified.  Post radiation changes as above. Diffuse abnormality the right tongue likely related to surgery and possibly denervation or postsurgical atrophy of the tongue.  21 x 14 mm right thyroid mass, biopsy recommended.   Electronically Signed   By: Franchot Gallo M.D.   On: 12/31/2013 13:48    Impression/Plan:    1) Head and Neck Cancer Status: NED but needs thyroid US/biopsy  2) Nutritional Status: - weight: stable - PEG tube: still using  3) Risk Factors: The patient has been educated about risk factors including alcohol and tobacco abuse; they understand that avoidance of alcohol and tobacco is important to prevent recurrences as well as other cancers  4) Swallowing: stable, functional with soft foods  5) Dental: Encouraged to continue regular followup with dentistry, and dental hygiene including fluoride rinses.   6) Thyroid function: should not be affected by RT (Thyroid outside RT field); ordering biopsy of right thyroid lesion from imaging.  7) Social: counseling in [facility] for emotional needs, PRN.  8) Other: artificial saliva, Therabreath Lozenges PRN  9) Follow-up in 4 months. The patient was encouraged to call with any issues or questions before then.    _____________________________________   Eppie Gibson, MD

## 2014-01-03 ENCOUNTER — Ambulatory Visit: Payer: Medicare Other | Admitting: Physical Therapy

## 2014-01-03 DIAGNOSIS — Z5189 Encounter for other specified aftercare: Secondary | ICD-10-CM | POA: Diagnosis not present

## 2014-01-05 ENCOUNTER — Ambulatory Visit: Payer: Medicare Other | Admitting: Physical Therapy

## 2014-01-05 DIAGNOSIS — Z5189 Encounter for other specified aftercare: Secondary | ICD-10-CM | POA: Diagnosis not present

## 2014-01-07 ENCOUNTER — Other Ambulatory Visit: Payer: Self-pay | Admitting: Radiation Oncology

## 2014-01-07 DIAGNOSIS — E079 Disorder of thyroid, unspecified: Secondary | ICD-10-CM

## 2014-01-13 ENCOUNTER — Ambulatory Visit: Payer: TRICARE For Life (TFL) | Admitting: Physical Therapy

## 2014-01-14 ENCOUNTER — Ambulatory Visit (HOSPITAL_COMMUNITY)
Admission: RE | Admit: 2014-01-14 | Discharge: 2014-01-14 | Disposition: A | Discharge status: Home / Self Care | Payer: Medicare Other | Source: Ambulatory Visit | Attending: Radiation Oncology | Admitting: Radiation Oncology

## 2014-01-14 DIAGNOSIS — E042 Nontoxic multinodular goiter: Secondary | ICD-10-CM | POA: Diagnosis not present

## 2014-01-14 DIAGNOSIS — E079 Disorder of thyroid, unspecified: Secondary | ICD-10-CM

## 2014-01-14 DIAGNOSIS — Z8589 Personal history of malignant neoplasm of other organs and systems: Secondary | ICD-10-CM | POA: Diagnosis not present

## 2014-01-14 DIAGNOSIS — E041 Nontoxic single thyroid nodule: Secondary | ICD-10-CM | POA: Diagnosis not present

## 2014-01-14 NOTE — Procedures (Signed)
US guided FNA x 4 (via 25 gauge needles) performed of dominant right thyroid nodule. No immediate complications. Path pending. Medication utilized -1 % lidocaine to skin/SQ tissue.

## 2014-01-25 ENCOUNTER — Ambulatory Visit: Payer: TRICARE For Life (TFL) | Attending: Radiation Oncology

## 2014-01-25 DIAGNOSIS — M24511 Contracture, right shoulder: Secondary | ICD-10-CM | POA: Insufficient documentation

## 2014-01-25 DIAGNOSIS — M25511 Pain in right shoulder: Secondary | ICD-10-CM | POA: Insufficient documentation

## 2014-01-25 DIAGNOSIS — Z5189 Encounter for other specified aftercare: Secondary | ICD-10-CM | POA: Insufficient documentation

## 2014-01-28 DIAGNOSIS — C069 Malignant neoplasm of mouth, unspecified: Secondary | ICD-10-CM | POA: Diagnosis not present

## 2014-04-22 ENCOUNTER — Ambulatory Visit
Admission: RE | Admit: 2014-04-22 | Discharge: 2014-04-22 | Disposition: A | Discharge status: Home / Self Care | Payer: Medicare Other | Source: Ambulatory Visit | Attending: Radiation Oncology | Admitting: Radiation Oncology

## 2014-04-22 ENCOUNTER — Encounter: Payer: Self-pay | Admitting: Radiation Oncology

## 2014-04-22 VITALS — BP 122/69 | HR 77 | Temp 98.2°F | Resp 20 | Wt 125.2 lb

## 2014-04-22 DIAGNOSIS — B372 Candidiasis of skin and nail: Secondary | ICD-10-CM

## 2014-04-22 DIAGNOSIS — C411 Malignant neoplasm of mandible: Secondary | ICD-10-CM | POA: Diagnosis not present

## 2014-04-22 DIAGNOSIS — R21 Rash and other nonspecific skin eruption: Secondary | ICD-10-CM

## 2014-04-22 MED ORDER — MICONAZOLE NITRATE 2 % EX OINT: TOPICAL_OINTMENT | CUTANEOUS | Status: DC

## 2014-04-22 NOTE — Progress Notes (Addendum)
Pain Status: pain in mouth that comes and goes, not long lasting  Weight changes, if any:   Nutritional Status a) intake: soft to liquid foods, she is blending/making herself b) using a feeding tube?: Osmolite 1.5 cans 4 x daily c) weight changes, if any: loss of 5 lbs   Swallowing Status: does well majority of the time, occasionally coughs, chokes  Dental (if applicable): When was last visit with dentistry   Before tx she saw dentist [facility]  , no appt upcoming Using fluoride trays daily? Dental gel flouride   When was last ENT visit?   01/2014  Note in [place]  When is next ENT visit? Dr Wyonia Hough 06/20/14  Other notable issues, if any: bites tongue often, tastes returning, dry mouth, currently has sinus drainage, productive cough with green sputum x 3-4 weeks. Peg tube site sore, red with scant amount of  tan drainage under tube at insertion site,no odor noted although patient states she has noticed foul odor. Yyeast type appearing rash around umbilicus, and she states this is also under her lower abdominal skin flap. She states she has had this rash x 1 month. She has not seen dr for this.

## 2014-04-22 NOTE — Progress Notes (Signed)
Radiation Oncology         [phone] ________________________________  Name: Theresa Stephens MRN: [id number]  Date: 04/22/2014  DOB: 11/22/1950  Follow-Up Visit Note  CC: JAMES D BROWNE     Diagnosis and Prior Radiotherapy:     No diagnosis found. 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, prior tobacco history  Indication for treatment: Curative; post-operative  Radiation treatment dates: 07/06/2013-08/17/2013  Site/dose: Right oral cavity tumor bed and right upper neck / 60 Gy in 30 fractions to tumor bed with margin, and 54 Gy in 30 fractions to right neck levels Ia-II    Narrative:   Pain Status: pain in mouth that comes and goes, not long lasting   Nutritional Status a) intake: soft to liquid foods, she is blending/making herself b) using a feeding tube?: Osmolite 1.5 cans 4 x daily c) weight changes, if any: loss of 5 lbs   Swallowing Status: does well majority of the time, occasionally coughs, chokes  Dental (if applicable): When was last visit with dentistry   Before tx she saw dentist at [facility] cancer center: no appt upcoming Using fluoride trays daily? Dental gel flouride   When was last ENT visit?   01/2014  Note in [place]  When is next ENT visit? Dr Wyonia Hough 06/20/14  Other notable issues, if any: bites tongue often, tastes returning, dry mouth, currently has sinus drainage, productive cough with green sputum x 3-4 weeks. Peg tube site sore, red with scant amount of  tan drainage under tube at insertion site,no odor noted although patient states she has noticed foul odor. Yeast type appearing rash around umbilicus, and she states this is also under her lower abdominal skin flap. She states she has had this rash x 1 month.    Per Dr Vicie Mutters: Her free flap can be debulked some that might help her ability to swallow. She is not sure she wants to do this.  She is also not interested in beings referred back to SLP, right  now.  She has no PCP or dentist.  Spending time daily with her 2 yr old granddaugter  ALLERGIES:  has No Known Allergies.  Meds: Current Outpatient Prescriptions  Medication Sig Dispense Refill  . FLUoxetine (PROZAC) 10 MG capsule Take 10 mg by mouth daily. Dr. Marijo Conception [facility]    . guaiFENesin (MUCINEX) 600 MG 12 hr tablet Take 1 tablet (600 mg total) by mouth 2 (two) times daily. To help secretions. 30 tablet 3  . Nutritional Supplements (FEEDING SUPPLEMENT, OSMOLITE 1.5 CAL,) LIQD Increase bolus TF of Osmolite 1.5 to 1.5 cans 4 times daily with 130 ml free water before and after each feeding. 1422 mL 0  . sodium fluoride (DENTAGEL) 1.1 % GEL dental gel Place 1 application onto teeth at bedtime.     No current facility-administered medications for this encounter.    Physical Findings: The patient is in no acute distress. Patient is alert and oriented.  weight is 125 lb 3.2 oz (56.79 kg). Her oral temperature is 98.2 F (36.8 C). Her blood pressure is 122/69 and her pulse is 77. Her respiration is 20. Marland Kitchen Oropharyngeal mucosa is intact with no thrush or lesions. No palpable cervical or supraclavicular lymphadenopathy. Skin intact and smooth over neck.  Postoperative changes. Heart RRR, Chest CTAB. Yeast like rash around PEG tube ostomy.  Lab Findings: Lab Results  Component Value Date   WBC 11.2* 03/12/2013  HGB 13.8 03/12/2013   HCT 41.3 03/12/2013   MCV 86.7 03/12/2013   PLT 312 03/12/2013    No results found for: TSH  Radiographic Findings: No results found.  Impression/Plan:    1) Head and Neck Cancer Status: NED   2) Nutritional Status: - weight: down 5 lb - PEG tube: still using  3) Risk Factors: The patient has been educated about risk factors including alcohol and tobacco abuse; they understand that avoidance of alcohol and tobacco is important to prevent recurrences as well as other cancers  4) Swallowing: occasional issues, but declines SLP.   See notes above re: Dr Vicie Mutters  5) Dental: Encouraged to continue regular followup with dentistry, and dental hygiene including fluoride rinses.  Has no dentist due to $ issues.  Also, no PCP.  Will ask Gayleen Orem, RN, our Head and Neck Oncology Navigator to look into finding someone that can see her  6) Thyroid function: should not be affected by RT (Thyroid outside RT field)  7) Social: counseling in [place] for emotional needs, PRN.  8) Other: Thyroid biopsy in November was benign. Miconazole topical Rx'd for yeast rash today.  9) Follow-up in 5 months. The patient was encouraged to call with any issues or questions before then.    _____________________________________   Eppie Gibson, MD

## 2014-06-20 DIAGNOSIS — Z9221 Personal history of antineoplastic chemotherapy: Secondary | ICD-10-CM | POA: Diagnosis not present

## 2014-06-20 DIAGNOSIS — C069 Malignant neoplasm of mouth, unspecified: Secondary | ICD-10-CM | POA: Diagnosis not present

## 2014-06-20 DIAGNOSIS — Z85819 Personal history of malignant neoplasm of unspecified site of lip, oral cavity, and pharynx: Secondary | ICD-10-CM | POA: Diagnosis not present

## 2014-06-20 DIAGNOSIS — R21 Rash and other nonspecific skin eruption: Secondary | ICD-10-CM | POA: Diagnosis not present

## 2014-06-20 DIAGNOSIS — Z923 Personal history of irradiation: Secondary | ICD-10-CM | POA: Diagnosis not present

## 2014-06-20 DIAGNOSIS — Z931 Gastrostomy status: Secondary | ICD-10-CM | POA: Diagnosis not present

## 2014-06-30 DIAGNOSIS — C411 Malignant neoplasm of mandible: Secondary | ICD-10-CM | POA: Diagnosis not present

## 2014-06-30 DIAGNOSIS — C069 Malignant neoplasm of mouth, unspecified: Secondary | ICD-10-CM | POA: Diagnosis not present

## 2014-06-30 DIAGNOSIS — E079 Disorder of thyroid, unspecified: Secondary | ICD-10-CM | POA: Diagnosis not present

## 2014-07-04 DIAGNOSIS — Z85819 Personal history of malignant neoplasm of unspecified site of lip, oral cavity, and pharynx: Secondary | ICD-10-CM | POA: Diagnosis not present

## 2014-07-19 DIAGNOSIS — Z9889 Other specified postprocedural states: Secondary | ICD-10-CM | POA: Diagnosis not present

## 2014-07-19 DIAGNOSIS — R1310 Dysphagia, unspecified: Secondary | ICD-10-CM | POA: Diagnosis not present

## 2014-07-19 DIAGNOSIS — Z87891 Personal history of nicotine dependence: Secondary | ICD-10-CM | POA: Diagnosis not present

## 2014-07-19 DIAGNOSIS — C069 Malignant neoplasm of mouth, unspecified: Secondary | ICD-10-CM | POA: Diagnosis not present

## 2014-07-19 DIAGNOSIS — F419 Anxiety disorder, unspecified: Secondary | ICD-10-CM | POA: Diagnosis not present

## 2014-07-19 DIAGNOSIS — R471 Dysarthria and anarthria: Secondary | ICD-10-CM | POA: Diagnosis not present

## 2014-07-21 DIAGNOSIS — Z9889 Other specified postprocedural states: Secondary | ICD-10-CM | POA: Diagnosis not present

## 2014-07-21 DIAGNOSIS — Z87891 Personal history of nicotine dependence: Secondary | ICD-10-CM | POA: Diagnosis not present

## 2014-07-21 DIAGNOSIS — R471 Dysarthria and anarthria: Secondary | ICD-10-CM | POA: Diagnosis not present

## 2014-07-21 DIAGNOSIS — K130 Diseases of lips: Secondary | ICD-10-CM | POA: Diagnosis not present

## 2014-07-21 DIAGNOSIS — C069 Malignant neoplasm of mouth, unspecified: Secondary | ICD-10-CM | POA: Diagnosis not present

## 2014-07-21 DIAGNOSIS — R1310 Dysphagia, unspecified: Secondary | ICD-10-CM | POA: Diagnosis not present

## 2014-07-21 DIAGNOSIS — C801 Malignant (primary) neoplasm, unspecified: Secondary | ICD-10-CM | POA: Diagnosis not present

## 2014-07-21 DIAGNOSIS — F419 Anxiety disorder, unspecified: Secondary | ICD-10-CM | POA: Diagnosis not present

## 2014-07-27 DIAGNOSIS — C069 Malignant neoplasm of mouth, unspecified: Secondary | ICD-10-CM | POA: Diagnosis not present

## 2014-08-15 DIAGNOSIS — Z9889 Other specified postprocedural states: Secondary | ICD-10-CM | POA: Diagnosis not present

## 2014-08-15 DIAGNOSIS — Z4889 Encounter for other specified surgical aftercare: Secondary | ICD-10-CM | POA: Diagnosis not present

## 2014-09-22 NOTE — Progress Notes (Addendum)
Pain Status:   Nutritional Status 1) Using a feeding tube?: still using 2) Summary of daily intake: soft to liquid foods 3) Weight changes, if any:   Swallowing Status: occasional issues but does well the majority of the time; occasionally chokes  Smoking or chewing tobacco? Former smoker quit in [id number]  Dental (if applicable):  Next dental appointment .  Using fluoride trays daily? Yes   Summary of last ENT visit is intraoral skin paddle without atrophy, tongue deviates to the left but has good motion, some asymmetry to facial contour due to graft, mandible stable, no intraoral or neck masses. Next ENT visit is on .  Summary of last medical oncology visit (if applicable) is .   Next med/onc visit is on .  Imaging done in the last month (if applicable) revealed: None  Other notable issues, if any:

## 2014-09-23 ENCOUNTER — Ambulatory Visit
Admission: RE | Admit: 2014-09-23 | Discharge: 2014-09-23 | Disposition: A | Discharge status: Home / Self Care | Payer: Medicare Other | Source: Ambulatory Visit | Attending: Radiation Oncology | Admitting: Radiation Oncology

## 2014-12-12 ENCOUNTER — Emergency Department (HOSPITAL_COMMUNITY)
Admission: EM | Admit: 2014-12-12 | Discharge: 2014-12-12 | Disposition: A | Discharge status: Home / Self Care | Payer: Medicare Other | Attending: Emergency Medicine | Admitting: Emergency Medicine

## 2014-12-12 ENCOUNTER — Emergency Department (HOSPITAL_COMMUNITY): Payer: Medicare Other

## 2014-12-12 ENCOUNTER — Encounter (HOSPITAL_COMMUNITY): Payer: Self-pay | Admitting: Emergency Medicine

## 2014-12-12 DIAGNOSIS — Z431 Encounter for attention to gastrostomy: Secondary | ICD-10-CM

## 2014-12-12 DIAGNOSIS — Z87891 Personal history of nicotine dependence: Secondary | ICD-10-CM | POA: Diagnosis not present

## 2014-12-12 DIAGNOSIS — T85528A Displacement of other gastrointestinal prosthetic devices, implants and grafts, initial encounter: Secondary | ICD-10-CM

## 2014-12-12 DIAGNOSIS — Z85818 Personal history of malignant neoplasm of other sites of lip, oral cavity, and pharynx: Secondary | ICD-10-CM | POA: Insufficient documentation

## 2014-12-12 DIAGNOSIS — Z79899 Other long term (current) drug therapy: Secondary | ICD-10-CM | POA: Insufficient documentation

## 2014-12-12 DIAGNOSIS — R1900 Intra-abdominal and pelvic swelling, mass and lump, unspecified site: Secondary | ICD-10-CM | POA: Diagnosis not present

## 2014-12-12 DIAGNOSIS — K9423 Gastrostomy malfunction: Secondary | ICD-10-CM | POA: Insufficient documentation

## 2014-12-12 DIAGNOSIS — R109 Unspecified abdominal pain: Secondary | ICD-10-CM | POA: Diagnosis not present

## 2014-12-12 DIAGNOSIS — L03311 Cellulitis of abdominal wall: Secondary | ICD-10-CM | POA: Diagnosis not present

## 2014-12-12 LAB — CBC WITH DIFFERENTIAL/PLATELET
Basophils Absolute: 0 10*3/uL (ref 0.0–0.1)
Basophils Relative: 0 %
Eosinophils Absolute: 0.1 10*3/uL (ref 0.0–0.7)
Eosinophils Relative: 1 %
HEMATOCRIT: 38.3 % (ref 36.0–46.0)
HEMOGLOBIN: 12.6 g/dL (ref 12.0–15.0)
LYMPHS ABS: 0.9 10*3/uL (ref 0.7–4.0)
LYMPHS PCT: 10 %
MCH: 29.2 pg (ref 26.0–34.0)
MCHC: 32.9 g/dL (ref 30.0–36.0)
MCV: 88.9 fL (ref 78.0–100.0)
MONOS PCT: 9 %
Monocytes Absolute: 0.8 10*3/uL (ref 0.1–1.0)
NEUTROS ABS: 7.1 10*3/uL (ref 1.7–7.7)
NEUTROS PCT: 80 %
Platelets: 372 10*3/uL (ref 150–400)
RBC: 4.31 MIL/uL (ref 3.87–5.11)
RDW: 12.9 % (ref 11.5–15.5)
WBC: 8.9 10*3/uL (ref 4.0–10.5)

## 2014-12-12 LAB — COMPREHENSIVE METABOLIC PANEL
ALBUMIN: 4.3 g/dL (ref 3.5–5.0)
ALK PHOS: 78 U/L (ref 38–126)
ALT: 15 U/L (ref 14–54)
ANION GAP: 4 — AB (ref 5–15)
AST: 23 U/L (ref 15–41)
BUN: 9 mg/dL (ref 6–20)
CHLORIDE: 103 mmol/L (ref 101–111)
CO2: 31 mmol/L (ref 22–32)
Calcium: 9.4 mg/dL (ref 8.9–10.3)
Creatinine, Ser: 0.57 mg/dL (ref 0.44–1.00)
GFR calc non Af Amer: >60 mL/min (ref >60)
GLUCOSE: 115 mg/dL — AB (ref 65–99)
POTASSIUM: 4 mmol/L (ref 3.5–5.1)
SODIUM: 138 mmol/L (ref 135–145)
Total Bilirubin: 0.4 mg/dL (ref 0.3–1.2)
Total Protein: 7.8 g/dL (ref 6.5–8.1)

## 2014-12-12 MED ORDER — IOHEXOL 300 MG/ML  SOLN
100.0000 mL | Freq: Once | INTRAMUSCULAR | Status: AC | PRN
  Administered 2014-12-12: 100 mL via INTRAVENOUS

## 2014-12-12 NOTE — ED Notes (Signed)
Pt in CT.

## 2014-12-12 NOTE — ED Notes (Signed)
Bed: HU83 Expected date:  Expected time:  Means of arrival:  Comments: EMS-abnormal labs

## 2014-12-12 NOTE — Discharge Instructions (Signed)
Gastrostomy Tube Home Guide  A gastrostomy tube is a tube that is surgically placed into the stomach. It is also called a "G-tube." G-tubes are used when a person is unable to eat and drink enough on their own to stay healthy. The tube is inserted into the stomach through a small cut (incision) in the skin. This tube is used for:   Feeding.   Giving medication.  GASTROSTOMY TUBE CARE   Wash your hands with soap and water.   Remove the old dressing (if any). Some styles of G-tubes may need a dressing inserted between the skin and the G-tube. Other types of G-tubes do not require a dressing. Ask your health care provider if a dressing is needed.   Check the area where the tube enters the skin (insertion site) for redness, swelling, or pus-like (purulent) drainage. A small amount of clear or tan liquid drainage is normal. Check to make sure scar tissue (skin) is not growing around the insertion site. This could have a raised, bumpy appearance.   A cotton swab can be used to clean the skin around the tube:   When the G-tube is first put in, a normal saline solution or water can be used to clean the skin.   Mild soap and warm water can be used when the skin around the G-tube site has healed.   Roll the cotton swab around the G-tube insertion site to remove any drainage or crusting at the insertion site.  STOMACH RESIDUALS  Feeding tube residuals are the amount of liquids that are in the stomach at any given time. Residuals may be checked before giving feedings, medications, or as instructed by your health care provider.   Ask your health care provider if there are instances when you would not start tube feedings depending on the amount or type of contents withdrawn from the stomach.   Check residuals by attaching a syringe to the G-tube and pulling back on the syringe plunger. Note the amount, and return the residual back into the stomach.  FLUSHING THE G-TUBE   The G-tube should be periodically flushed with  clean warm water to keep it from clogging.   Flush the G-tube after feedings or medications. Draw up 30 mL of warm water in a syringe. Connect the syringe to the G-tube and slowly push the water into the tube.   Do not push feedings, medications, or flushes rapidly. Flush the G-tube gently and slowly.   Only use syringes made for G-tubes to flush medications or feedings.   Your health care provider may want the G-tube flushed more often or with more water. If this is the case, follow your health care provider's instructions.  FEEDINGS  Your health care provider will determine whether feedings are given as a bolus (a certain amount given at one time and at scheduled times) or whether feedings will be given continuously on a feeding pump.    Formulas should be given at room temperature.   If feedings are continuous, no more than 4 hours worth of feedings should be placed in the feeding bag. This helps prevent spoilage or accidental excess infusion.   Cover and place unused formula in the refrigerator.   If feedings are continuous, stop the feedings when medications or flushes are given. Be sure to restart the feedings.   Feeding bags and syringes should be replaced as instructed by your health care provider.  GIVING MEDICATION    In general, it is best if all medications   are in a liquid form for G-tube administration. Liquid medications are less likely to clog the G-tube.   Mix the liquid medication with 30 mL (or amount recommended by your health care provider) of warm water.   Draw up the medication into the syringe.   Attach the syringe to the G-tube and slowly push the mixture into the G-tube.   After giving the medication, draw up 30 mL of warm water in the syringe and slowly flush the G-tube.   For pills or capsules, check with your health care provider first before crushing medications. Some pills are not effective if they are crushed. Some capsules are sustained-release medications.   If  appropriate, crush the pill or capsule and mix with 30 mL of warm water. Using the syringe, slowly push the medication through the tube, then flush the tube with another 30 mL of tap water.  G-TUBE PROBLEMS  G-tube was pulled out.   Cause: May have been pulled out accidentally.   Solutions: Cover the opening with clean dressing and tape. Call your health care provider right away. The G-tube should be put in as soon as possible (within 4 hours) so the G-tube opening (tract) does not close. The G-tube needs to be put in at a health care setting. An X-ray needs to be done to confirm placement before the G-tube can be used again.  Redness, irritation, soreness, or foul odor around the gastrostomy site.   Cause: May be caused by leakage or infection.   Solutions: Call your health care provider right away.  Large amount of leakage of fluid or mucus-like liquid present (a large amount means it soaks clothing).   Cause: Many reasons could cause the G-tube to leak.   Solutions: Call your health care provider to discuss the amount of leakage.  Skin or scar tissue appears to be growing where tube enters skin.    Cause: Tissue growth may develop around the insertion site if the G-tube is moved or pulled on excessively.   Solutions: Secure tube with tape so that excess movement does not occur. Call your health care provider.  G-tube is clogged.   Cause: Thick formula or medication.   Solutions: Try to slowly push warm water into the tube with a large syringe. Never try to push any object into the tube to unclog it. Do not force fluid into the G-tube. If you are unable to unclog the tube, call your health care provider right away.  TIPS   Head of bed (HOB) position refers to the upright position of a person's upper body.   When giving medications or a feeding bolus, keep the HOB up as told by your health care provider. Do this during the feeding and for 1 hour after the feeding or medication administration.   If  continuous feedings are being given, it is best to keep the HOB up as told by your health care provider. When ADLs (activities of daily living) are performed and the HOB needs to be flat, be sure to turn the feeding pump off. Restart the feeding pump when the HOB is returned to the recommended height.   Do not pull or put tension on the tube.   To prevent fluid backflow, kink the G-tube before removing the cap or disconnecting a syringe.   Check the G-tube length every day. Measure from the insertion site to the end of the G-tube. If the length is longer than previous measurements, the tube may be coming out. Call   your health care provider if you notice increasing G-tube length.   Oral care, such as brushing teeth, must be continued.   You may need to remove excess air (vent) from the G-tube. Your health care provider will tell you if this is needed.   Always call your health care provider if you have questions or problems with the G-tube.  SEEK IMMEDIATE MEDICAL CARE IF:    You have severe abdominal pain, tenderness, or abdominal bloating (distension).   You have nausea or vomiting.   You are constipated or have problems moving your bowels.   The G-tube insertion site is red, swollen, has a foul smell, or has yellow or brown drainage.   You have difficulty breathing or shortness of breath.   You have a fever.   You have a large amount of feeding tube residuals.   The G-tube is clogged and cannot be flushed.  MAKE SURE YOU:    Understand these instructions.   Will watch your condition.   Will get help right away if you are not doing well or get worse.  Document Released: 05/06/2001 Document Revised: 07/12/2013 Document Reviewed: 11/02/2012  [employer] Patient Information 2015 [employer]. This information is not intended to replace advice given to you by your health care provider. Make sure you discuss any questions you have with your health care provider.

## 2014-12-12 NOTE — ED Notes (Signed)
Peg tube fell out on the way to bathroom

## 2014-12-12 NOTE — ED Notes (Signed)
Pt states that she has had the same gtube for over a year and a half.  States that last week, it began oozing, bleeding, and coming out.  Appx a half an inch of gtubing noted outside of skin.

## 2014-12-12 NOTE — ED Notes (Signed)
Pt given discharge instructions and verbalized understanding of need to follow up, reasons to return to the ED and medications to continue at home. Pt states pain controlled, VSS. Pt IV removed intact. Site clean and dry. Pt denies further need or questions at this time.

## 2014-12-12 NOTE — ED Provider Notes (Signed)
CSN: [id number]     Arrival date & time 12/12/14  1418 History   First MD Initiated Contact with Patient 12/12/14 1616     Chief Complaint  Patient presents with  . G tube infection      The history is provided by the patient. No language interpreter was used.   Theresa Stephens presents for evaluation of G-tube malfunction. She has a history of a G-tube for the last year and a half following treatment for mouth cancer. Her cancer is thought to be in remission but she still uses the G-tube for osmolite 3 times a day. She states that over the last couple of weeks she's noticed that the G-tube seems to be protruding more. It started to protrude a lot more over the last 3 days. She has pain around the tube site and local redness. She has no known fevers at home. She is still able to use the tube without difficulty.  Past Medical History  Diagnosis Date  . Mouth pain 03/12/2013  . 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 Tongue biopsy    . Tonsillectomy     Family History  Problem Relation Age of Onset  . Cancer Maternal Uncle     lung ca  . Cancer Daughter 47    breast ca   Social History  Substance Use Topics  . Smoking status: Former Smoker -- 1.00 packs/day for 45 years    Quit date: 03/04/2013  . Smokeless tobacco: Never Used     Comment: stopped smoking 02/2013  . Alcohol Use: No   OB History    No data available     Review of Systems  All other systems reviewed and are negative.     Allergies  Review of patient's allergies indicates no known allergies.  Home Medications   Prior to Admission medications   Medication Sig Start Date End Date Taking? Authorizing Provider  acetaminophen (TYLENOL) 500 MG tablet Take 500 mg by mouth every 6 (six) hours as needed for moderate pain.   Yes  Historical Provider, MD  guaiFENesin (MUCINEX) 600 MG 12 hr tablet Take 1 tablet (600 mg total) by mouth 2 (two) times daily. To help secretions. Patient not taking: Reported on 12/12/2014 08/16/13   Eppie Gibson, MD  Miconazole Nitrate 2 % OINT Apply as directed to skin TID for 2 weeks. Patient not taking: Reported on 12/12/2014 04/22/14   Eppie Gibson, MD  Nutritional Supplements (FEEDING SUPPLEMENT, OSMOLITE 1.5 CAL,) LIQD Increase bolus TF of Osmolite 1.5 to 1.5 cans 4 times daily with 130 ml free water before and after each feeding. 08/25/13   Eppie Gibson, MD   BP 133/70 mmHg  Pulse 96  Temp(Src) 99.4 F (37.4 C) (Oral)  Resp 16  SpO2 98% Physical Exam  Constitutional: She is oriented to person, place, and time. She appears well-developed and well-nourished.  HENT:  Head: Normocephalic and atraumatic.  Cardiovascular: Normal rate and regular rhythm.   No murmur heard. Pulmonary/Chest: Effort normal and breath sounds normal. No respiratory distress.  Abdominal:  67 French percutaneous gastrostomy tube in the left upper quadrant. There is mild swelling and erythema around the tube was slight watery and sanguinous discharge. There is mild to moderate local tenderness and firmness surrounding the gastrostomy tube site.  Musculoskeletal: She exhibits no edema or  tenderness.  Neurological: She is alert and oriented to person, place, and time.  Skin: Skin is warm and dry.  Psychiatric: She has a normal mood and affect. Her behavior is normal.  Nursing note and vitals reviewed.   ED Course  Procedures (including critical care time) Labs Review Labs Reviewed  COMPREHENSIVE METABOLIC PANEL - Abnormal; Notable for the following:    Glucose, Bld 115 (*)    Anion gap 4 (*)    All other components within normal limits  CBC WITH DIFFERENTIAL/PLATELET    Imaging Review Ct Abdomen Pelvis W Contrast  12/12/2014   CLINICAL DATA:  Abdominal wall cellulitis, swelling and pain at the gastrostomy  site. History of head neck cancer.  EXAM: CT ABDOMEN AND PELVIS WITH CONTRAST  TECHNIQUE: Multidetector CT imaging of the abdomen and pelvis was performed using the standard protocol following bolus administration of intravenous contrast.  CONTRAST:  157mL OMNIPAQUE IOHEXOL 300 MG/ML  SOLN  COMPARISON:  03/19/2013 PET-CT  FINDINGS: Lower chest: Minor subpleural areas scarring or atelectasis. No lower lobe pneumonia, collapse or consolidation. Normal heart size. No pericardial or pleural effusion. No significant hiatal hernia. Degenerative changes of the lower thoracic spine. Vertebral hemangioma at L1 on the left.  Abdomen: Anterior abdominal wall demonstrates diffuse edema and inflammation involving the skin, subcutaneous adipose layer, and abdominal wall musculature at the gastrostomy site. Within the gastrostomy percutaneous tract, there is a 4.8 x 1.9 cm ill-defined hypodense area with peripheral enhancement suspicious for developing phlegmon or early abscess.  Liver demonstrates scattered subcentimeter hypodensities, suspect small hepatic cysts but too small to definitively characterize. No biliary dilatation within the liver. Hepatic and portal veins are patent. Gallbladder, biliary system, pancreas, spleen, adrenal glands, and kidneys are within normal limits for age and demonstrate no acute process.  No intra-abdominal fluid collection, hemorrhage, abscess, or adenopathy. No leakage of contrast from the stomach within the peritoneal cavity.  Negative for bowel obstruction, dilatation, ileus, or free air.  Abdominal atherosclerosis noted of the aorta without aneurysm or occlusive process.  Pelvis: Uterus and adnexal normal in size for age. Urinary bladder under distended. Diverticulosis of the distal colon without acute inflammatory process. No pelvic free fluid, fluid collection, hemorrhage, abscess, adenopathy, inguinal abnormality, or hernia. Pelvic calcifications consistent with venous phleboliths.  Minor  degenerative changes of the pelvis and spine. Stable sclerotic focus in the left iliac bone, suspect bone island. No other acute osseous finding.  IMPRESSION: Abdominal wall cellulitis at the percutaneous gastrostomy site involving the skin, adipose tissue, and rectus abdominis musculature.  Ill-defined enhancing hypodense area within the percutaneous gastrostomy tract measuring 4.8 x 1.9 cm suspicious for phlegmon versus developing abscess.  No intra-abdominal abscess, bowel obstruction, or free air.  Other chronic findings as above.   Electronically Signed   By: Jerilynn Mages.  Shick M.D.   On: 12/12/2014 19:29   I have personally reviewed and evaluated these images and lab results as part of my medical decision-making.   EKG Interpretation None      MDM   Final diagnoses:  Dislodged gastrostomy tube ([place])   Pt here for PEG eval.  Gastrostomy tube fell out in the ED prior to CT scan.  D/w Dr. Lucia Gaskins regarding patient's hx, presentation, CT scan findings.  Physical exam is not c/w acute abscess.  Pt no longer needs her gastrostomy tube (she takes her meds and food by mouth).  Plan to keep gastrostomy tube out, outpatient follow up, close return precautions.  Discussed wound care.  Quintella Reichert, MD 12/13/14 0021

## 2015-01-01 IMAGING — CT CT CHEST W/ CM
2 of 4 series · 15 of 36 positions shown, 18 images · IV contrast (OMNIPAQUE)
Comparison: PET-CT on 03/19/2013

CLINICAL DATA: Squamous cell carcinoma of mandibular alveolar
ridge. Followup indeterminate pulmonary nodules.

EXAM:
CT CHEST WITH CONTRAST
TECHNIQUE: Multidetector CT imaging of the chest was performed during
intravenous contrast administration.
CONTRAST:  80mL OMNIPAQUE IOHEXOL 300 MG/ML  SOLN

[Series 2: chest with st · axial · 0.74mm/px · z∈[-319,-49]mm · 12 of 64 slices shown, 15 images]
[im 5/64  mediastinal]
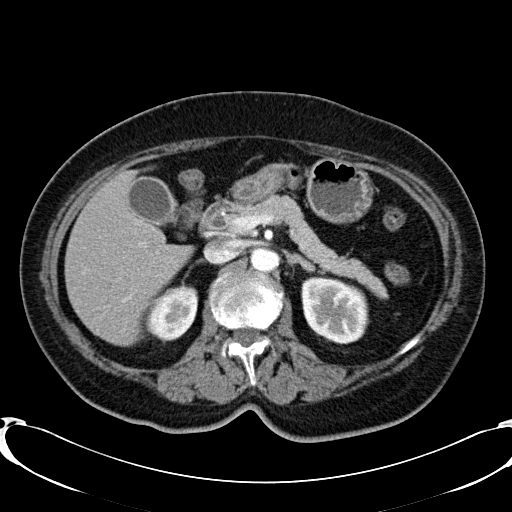
[im 5/64  lung]
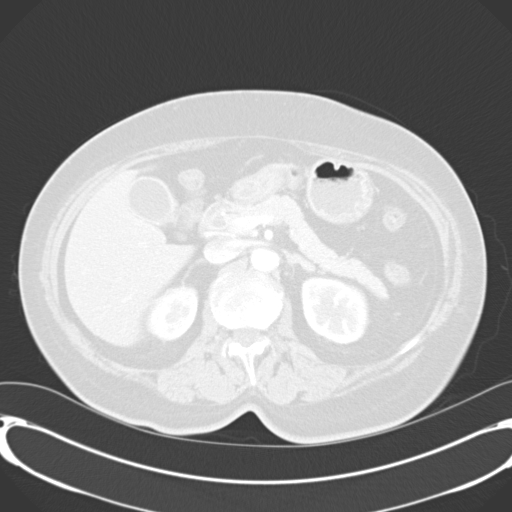
[im 10/64  lung]
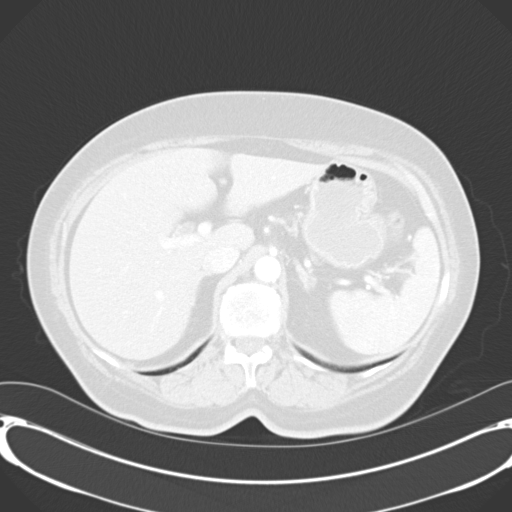
[im 14/64  lung]
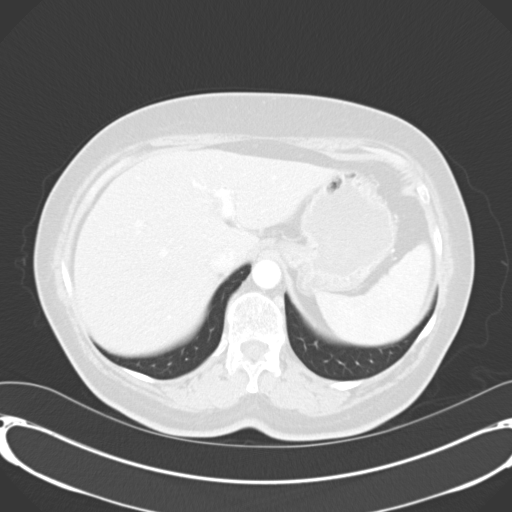
[im 19/64  lung]
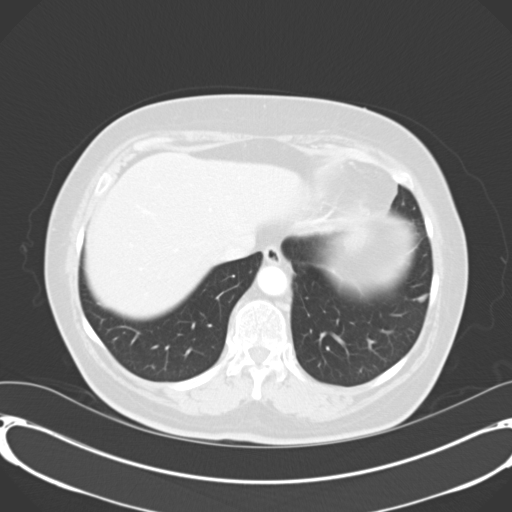
[im 23/64  mediastinal]
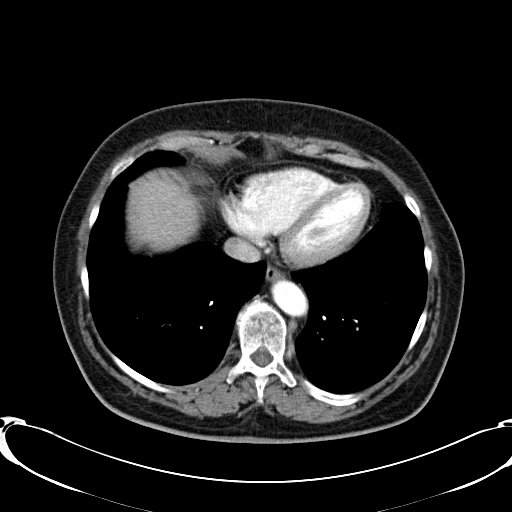
[im 23/64  lung]
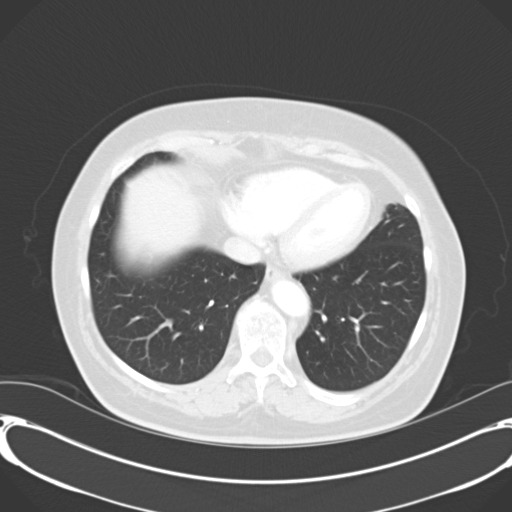
[im 28/64  lung]
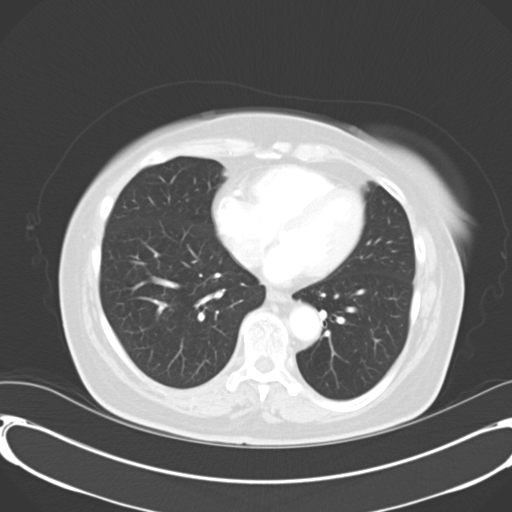
[im 37/64  lung]
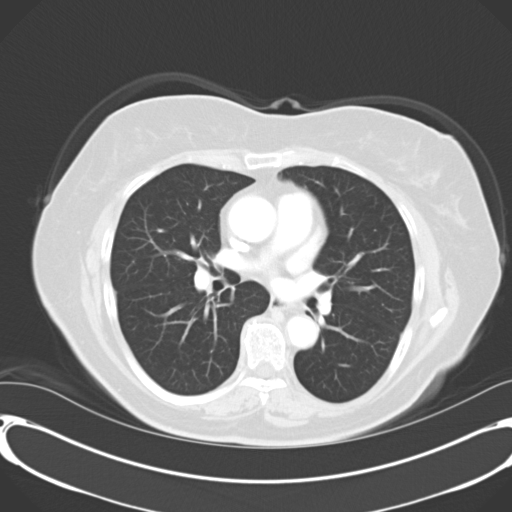
[im 41/64  lung]
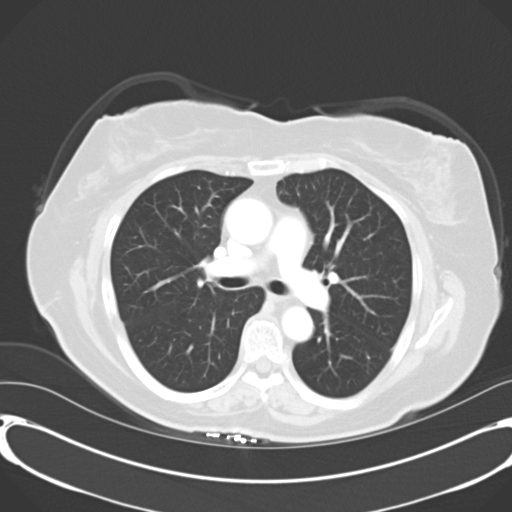
[im 46/64  mediastinal]
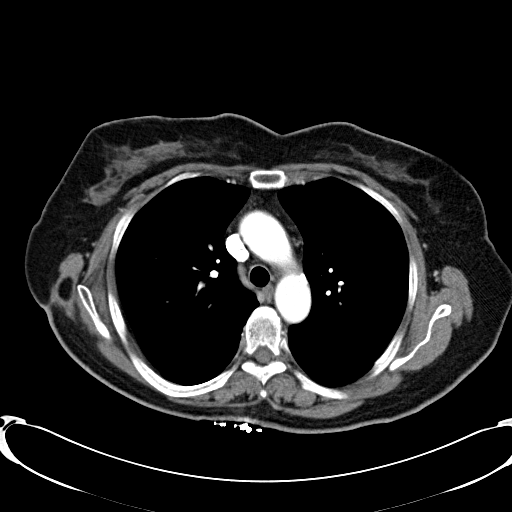
[im 46/64  lung]
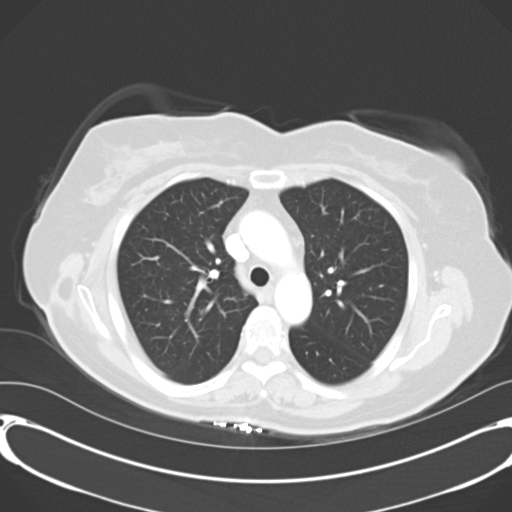
[im 50/64  lung]
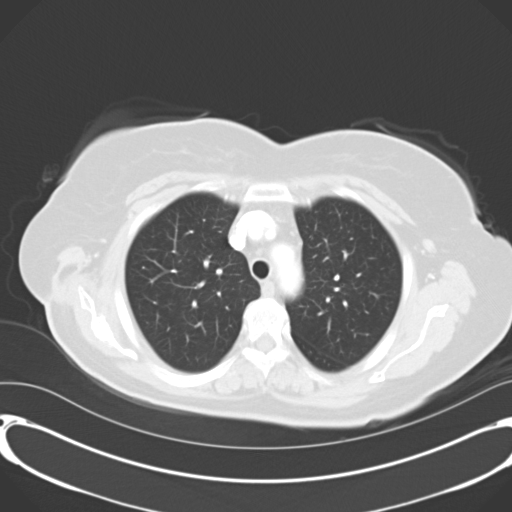
[im 55/64  lung]
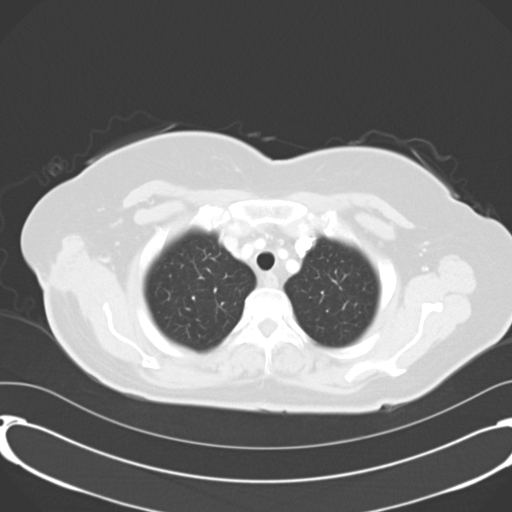
[im 59/64  lung]
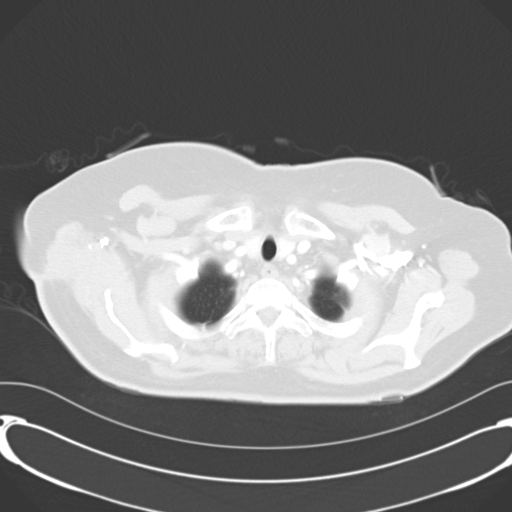

[Series 602: <mpr thick range> · coronal · 0.74mm/px · 3 of 82 slices shown]
[im 17/82  lung]
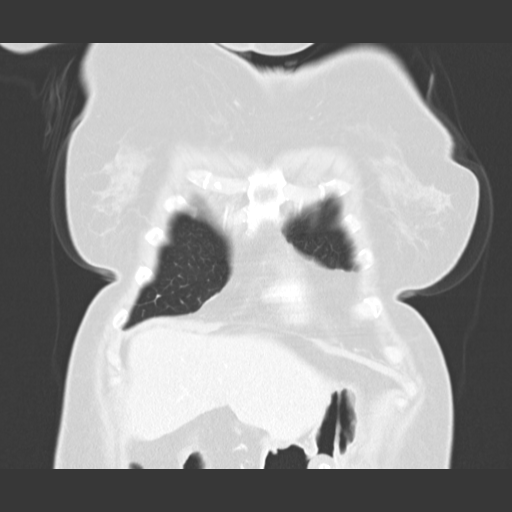
[im 33/82  lung]
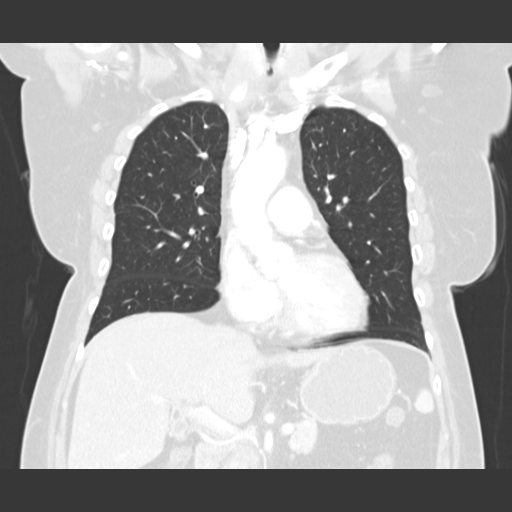
[im 49/82  lung]
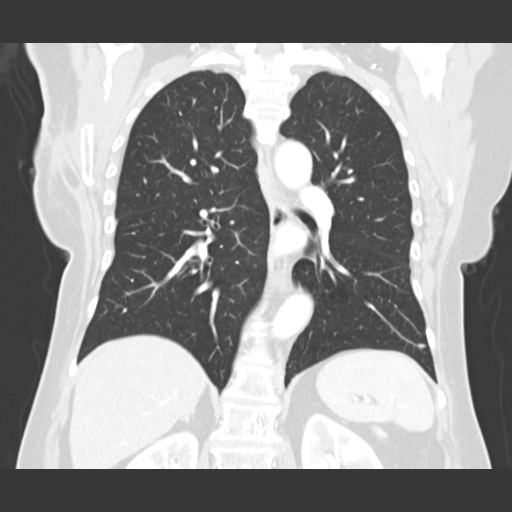

[15 of 36 positions shown; findings below may reference images not displayed]

FINDINGS: A cluster of tiny nodular densities is again seen in the lateral
right midlung, largest measuring 5 mm in the right lower lobe on
image 29. A 5 x 9 mm noncalcified pulmonary nodule in the lateral
left lower lobe on image 46 also remains stable. No new or enlarging
pulmonary nodules or masses are identified.

No evidence of pulmonary infiltrate or central endobronchial lesion.
No evidence of pleural or pericardial effusion.

No evidence of mediastinal or hilar lymphadenopathy. No adenopathy
seen elsewhere within the thorax. No evidence of chest wall mass or
suspicious bone lesions. Both adrenal glands are normal in
appearance. Percutaneous gastrostomy tube noted. A 1.8 cm
low-attenuation right thyroid lobe nodule is again seen which is
nonspecific in appearance.
IMPRESSION: Stable indeterminate bilateral pulmonary nodules, suggesting benign
etiology. Continued followup by CT is recommended in 6 months.

1.8 cm right thyroid lobe nodule appears stable since recent study.
Consider thyroid ultrasound for further evaluation. This follows ACR
consensus guidelines: Managing Incidental Thyroid Nodules Detected
on Imaging: White Paper of [REDACTED]. [HOSPITAL] 8183; [DATE].

## 2015-03-16 ENCOUNTER — Encounter (HOSPITAL_COMMUNITY): Payer: Self-pay | Admitting: *Deleted

## 2015-03-16 ENCOUNTER — Emergency Department (HOSPITAL_COMMUNITY)
Admission: EM | Admit: 2015-03-16 | Discharge: 2015-03-16 | Disposition: A | Discharge status: Home / Self Care | Payer: Medicare Other | Attending: Emergency Medicine | Admitting: Emergency Medicine

## 2015-03-16 ENCOUNTER — Emergency Department (HOSPITAL_COMMUNITY): Payer: Medicare Other

## 2015-03-16 DIAGNOSIS — Z79899 Other long term (current) drug therapy: Secondary | ICD-10-CM | POA: Diagnosis not present

## 2015-03-16 DIAGNOSIS — C031 Malignant neoplasm of lower gum: Secondary | ICD-10-CM | POA: Diagnosis not present

## 2015-03-16 DIAGNOSIS — R0602 Shortness of breath: Secondary | ICD-10-CM | POA: Diagnosis not present

## 2015-03-16 DIAGNOSIS — C411 Malignant neoplasm of mandible: Secondary | ICD-10-CM | POA: Diagnosis not present

## 2015-03-16 DIAGNOSIS — Z923 Personal history of irradiation: Secondary | ICD-10-CM | POA: Diagnosis not present

## 2015-03-16 DIAGNOSIS — R222 Localized swelling, mass and lump, trunk: Secondary | ICD-10-CM | POA: Diagnosis not present

## 2015-03-16 DIAGNOSIS — Z87891 Personal history of nicotine dependence: Secondary | ICD-10-CM | POA: Diagnosis not present

## 2015-03-16 DIAGNOSIS — R6884 Jaw pain: Secondary | ICD-10-CM | POA: Diagnosis present

## 2015-03-16 LAB — COMPREHENSIVE METABOLIC PANEL
ALK PHOS: 75 U/L (ref 38–126)
ALT: 11 U/L — AB (ref 14–54)
ANION GAP: 11 (ref 5–15)
AST: 17 U/L (ref 15–41)
Albumin: 3.5 g/dL (ref 3.5–5.0)
BUN: 11 mg/dL (ref 6–20)
CALCIUM: 9.4 mg/dL (ref 8.9–10.3)
CO2: 28 mmol/L (ref 22–32)
CREATININE: 0.62 mg/dL (ref 0.44–1.00)
Chloride: 103 mmol/L (ref 101–111)
Glucose, Bld: 115 mg/dL — ABNORMAL HIGH (ref 65–99)
Potassium: 4.1 mmol/L (ref 3.5–5.1)
SODIUM: 142 mmol/L (ref 135–145)
TOTAL PROTEIN: 7.4 g/dL (ref 6.5–8.1)
Total Bilirubin: 0.3 mg/dL (ref 0.3–1.2)

## 2015-03-16 LAB — CBC WITH DIFFERENTIAL/PLATELET
BASOS PCT: 0 %
Basophils Absolute: 0 10*3/uL (ref 0.0–0.1)
EOS ABS: 0.1 10*3/uL (ref 0.0–0.7)
Eosinophils Relative: 2 %
HCT: 35.1 % — ABNORMAL LOW (ref 36.0–46.0)
Hemoglobin: 11.2 g/dL — ABNORMAL LOW (ref 12.0–15.0)
Lymphocytes Relative: 14 %
Lymphs Abs: 1 10*3/uL (ref 0.7–4.0)
MCH: 27.7 pg (ref 26.0–34.0)
MCHC: 31.9 g/dL (ref 30.0–36.0)
MCV: 86.9 fL (ref 78.0–100.0)
MONOS PCT: 7 %
Monocytes Absolute: 0.5 10*3/uL (ref 0.1–1.0)
NEUTROS ABS: 5.5 10*3/uL (ref 1.7–7.7)
NEUTROS PCT: 77 %
PLATELETS: 382 10*3/uL (ref 150–400)
RBC: 4.04 MIL/uL (ref 3.87–5.11)
RDW: 12.3 % (ref 11.5–15.5)
WBC: 7.1 10*3/uL (ref 4.0–10.5)

## 2015-03-16 MED ORDER — FENTANYL CITRATE (PF) 100 MCG/2ML IJ SOLN
50.0000 ug | Freq: Once | INTRAMUSCULAR | Status: AC
  Administered 2015-03-16: 50 ug via INTRAVENOUS
  Filled 2015-03-16: qty 2

## 2015-03-16 MED ORDER — HYDROCODONE-ACETAMINOPHEN 7.5-325 MG/15ML PO SOLN: 10.0000 mL | ORAL | Status: DC | PRN

## 2015-03-16 MED ORDER — IOHEXOL 300 MG/ML  SOLN
75.0000 mL | Freq: Once | INTRAMUSCULAR | Status: AC | PRN
  Administered 2015-03-16: 75 mL via INTRAVENOUS

## 2015-03-16 MED ORDER — SODIUM CHLORIDE 0.9 % IV BOLUS (SEPSIS)
1000.0000 mL | Freq: Once | INTRAVENOUS | Status: AC
  Administered 2015-03-16: 1000 mL via INTRAVENOUS

## 2015-03-16 NOTE — ED Notes (Signed)
Pt states hx radiation and reconstructive surgery to R jaw in 2015.  Several weeks ago pt noticed that her R lower jaw was red and her tongue started swelling.  Pt denies sob, but she can barely speak.  She has been bleeding from her mouth for several days.  Unable to see airway d/t swollen tongue, but saturations are in upper 90's.

## 2015-03-16 NOTE — ED Provider Notes (Signed)
CSN: [id number]     Arrival date & time 03/16/15  1010 History   First MD Initiated Contact with Patient 03/16/15 1242     Chief Complaint  Patient presents with  . Jaw Pain  . Oral Swelling     (Consider location/radiation/quality/duration/timing/severity/associated sxs/prior Treatment) HPI  65 year old female with progressive jaw pain/swelling as well as tongue swelling for the last 3 weeks. 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 Patient states on and off she has had redness over her right jaw but has had some redness over these last 3 weeks. No fevers. She is having progressively more trouble swallowing and occasional shortness of breath. She does not feel shortness of breath right now. Has occasionally had bleeding in her mouth. Rates her pain as a 6/10  Past Medical History  Diagnosis Date  . Mouth pain 03/12/2013  . 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 Tongue biopsy    . Tonsillectomy     Family History  Problem Relation Age of Onset  . Cancer Maternal Uncle     lung ca  . Cancer Daughter 30    breast ca   Social History  Substance Use Topics  . Smoking status: Former Smoker -- 1.00 packs/day for 45 years    Quit date: 03/04/2013  . Smokeless tobacco: Never Used     Comment: stopped smoking 02/2013  . Alcohol Use: No   OB History    No data available     Review of Systems  Constitutional: Negative for fever.  HENT: Positive for facial swelling and trouble swallowing.   Respiratory: Positive for shortness of breath. Negative for cough.   Gastrointestinal: Negative for vomiting.  All other systems reviewed and are negative.     Allergies  Review of patient's allergies indicates no known  allergies.  Home Medications   Prior to Admission medications   Medication Sig Start Date End Date Taking? Authorizing Provider  acetaminophen (TYLENOL) 500 MG tablet Take 500 mg by mouth every 6 (six) hours as needed for moderate pain.    Historical Provider, MD  guaiFENesin (MUCINEX) 600 MG 12 hr tablet Take 1 tablet (600 mg total) by mouth 2 (two) times daily. To help secretions. Patient not taking: Reported on 12/12/2014 08/16/13   Eppie Gibson, MD  Miconazole Nitrate 2 % OINT Apply as directed to skin TID for 2 weeks. Patient not taking: Reported on 12/12/2014 04/22/14   Eppie Gibson, MD  Nutritional Supplements (FEEDING SUPPLEMENT, OSMOLITE 1.5 CAL,) LIQD Increase bolus TF of Osmolite 1.5 to 1.5 cans 4 times daily with 130 ml free water before and after each feeding. 08/25/13   Eppie Gibson, MD   BP 119/73 mmHg  Pulse 83  Temp(Src) 98.1 F (36.7 C) (Oral)  Resp 20  Ht 5' (1.524 m)  Wt 125 lb (56.7 kg)  BMI 24.41 kg/m2  SpO2 97% Physical Exam  Constitutional: She is oriented to person, place, and time. She appears well-developed and well-nourished.  Space in normal and complete sentences without respiratory distress or muffled speech   HENT:  Head: Normocephalic and atraumatic.    Right Ear: External ear normal.  Left Ear: External ear normal.  Nose: Nose normal.  Oropharynx view is limited but no obvious angioedema. Visible  tongue is not grossly swollen. No stridor.  Eyes: Right eye exhibits no discharge. Left eye exhibits no discharge.  Neck: Neck supple.  Healed tracheostomy scar  Cardiovascular: Normal rate, regular rhythm and normal heart sounds.   Pulmonary/Chest: Effort normal and breath sounds normal.  Abdominal: Soft. There is no tenderness.  Neurological: She is alert and oriented to person, place, and time.  Skin: Skin is warm and dry.  Nursing note and vitals reviewed.   ED Course  Procedures (including critical care time) Labs Review Labs Reviewed   COMPREHENSIVE METABOLIC PANEL - Abnormal; Notable for the following:    Glucose, Bld 115 (*)    ALT 11 (*)    All other components within normal limits  CBC WITH DIFFERENTIAL/PLATELET - Abnormal; Notable for the following:    Hemoglobin 11.2 (*)    HCT 35.1 (*)    All other components within normal limits    Imaging Review Ct Soft Tissue Neck W Contrast  03/16/2015  CLINICAL DATA:  Squamous cell carcinoma mandibular alveolar ridge. Postop surgery. Increased swelling right jaw past 2-3 weeks. EXAM: CT NECK WITH CONTRAST TECHNIQUE: Multidetector CT imaging of the neck was performed using the standard protocol following the bolus administration of intravenous contrast. CONTRAST:  80mL OMNIPAQUE IOHEXOL 300 MG/ML  SOLN COMPARISON:  CT neck 12/31/2013.  PET 03/19/2013 FINDINGS: Pharynx and larynx: Postop resection of right mandible with bone and soft tissue grafting. Large recurrent tumor in the right oral cavity and floor of mouth. This shows peripheral enhancement and central necrosis with fluid and gas bubbles. The mass lesion also is destroying the anterior portion of the mandibular graft with tumor extending lateral to the mandibular symphysis on the right. Overall the recurrent tumor measures 38 x 48 mm. There appears to be invasion of the tongue on the right. Postop removal of right submandibular gland. Postop right neck dissection. Edematous larynx compatible with radiation change. Salivary glands: Postop resection right submandibular gland. Radiation changes left submandibular gland which is hyper enhancing. Parotid gland negative Thyroid: Right thyroid mass measures 14 x 17 mm and is similar to the prior study. This has irregular low-density and calcification. Benign biopsy on 01/14/2014. Negative left lobe of the thyroid. Lymph nodes: Left level 2 lymph node measures 8 mm and has enlarged since the prior study. Left level 3 lymph node anterior to the jugular vein measures 8.5 mm and has enlarged  in the interval. 4 mm posterior lymph node on the left also has enlarged. These are suspicious for metastatic disease. No enlarged lymph nodes in the right neck postop right neck dissection. Vascular: Carotid artery patent bilaterally. Jugular vein patent bilaterally. Next, negative Limited intracranial: Negative Visualized orbits: Not scan Mastoids and visualized paranasal sinuses: Negative Skeleton: Mild cervical degenerative change. No fracture or bone lesion identified. Mandibular changes as above. Upper chest: Lung apices are clear without lung nodule or mass. Negative for mediastinal adenopathy. IMPRESSION: Postop resection of tumor of the right alveolar ridge of the mandible. There is a large recurrent tumor which appears to be invading the tongue and right mandibular symphysis. Enlarging lymph nodes in the left neck suspicious for metastatic disease. No adenopathy in the right postop right neck dissection. Right thyroid mass stable with prior benign biopsy 2015. Electronically Signed   By: Franchot Gallo M.D.   On: 03/16/2015 16:26   I have personally reviewed and evaluated these images and lab results as part of my medical decision-making.   EKG Interpretation None  MDM   Final diagnoses:  Carcinoma of mandible [facility])    Patient CT scan shows recurrent large tumor. Patient's airway is intact at this time. She is speaking in normal sentences, no stridor or signs of impending airway failure. She has had progressive trouble swallowing but she is not drooling or currently unable to swallow. Discussed with ENT at [facility], Dr. Imagene Gurney, who recommends patient call clinic first thing tomorrow for closer outpatient follow-up as she is rescheduled for follow-up of the and of the month. Discussed return precautions with patient. She will be started on hycet for pain. CT disc will be sent with patient.    Sherwood Gambler, MD 03/16/15 [phone]

## 2015-03-22 DIAGNOSIS — Z9889 Other specified postprocedural states: Secondary | ICD-10-CM | POA: Diagnosis not present

## 2015-03-22 DIAGNOSIS — C069 Malignant neoplasm of mouth, unspecified: Secondary | ICD-10-CM | POA: Diagnosis not present

## 2015-03-22 DIAGNOSIS — Z85818 Personal history of malignant neoplasm of other sites of lip, oral cavity, and pharynx: Secondary | ICD-10-CM | POA: Diagnosis not present

## 2015-03-22 DIAGNOSIS — R220 Localized swelling, mass and lump, head: Secondary | ICD-10-CM | POA: Diagnosis not present

## 2015-03-22 DIAGNOSIS — R634 Abnormal weight loss: Secondary | ICD-10-CM | POA: Diagnosis not present

## 2015-03-22 DIAGNOSIS — Z79891 Long term (current) use of opiate analgesic: Secondary | ICD-10-CM | POA: Diagnosis not present

## 2015-03-22 DIAGNOSIS — K148 Other diseases of tongue: Secondary | ICD-10-CM | POA: Diagnosis not present

## 2015-03-22 DIAGNOSIS — R638 Other symptoms and signs concerning food and fluid intake: Secondary | ICD-10-CM | POA: Diagnosis not present

## 2015-03-22 DIAGNOSIS — Z4889 Encounter for other specified surgical aftercare: Secondary | ICD-10-CM | POA: Diagnosis not present

## 2015-04-03 DIAGNOSIS — R634 Abnormal weight loss: Secondary | ICD-10-CM | POA: Diagnosis not present

## 2015-04-03 DIAGNOSIS — C069 Malignant neoplasm of mouth, unspecified: Secondary | ICD-10-CM | POA: Diagnosis not present

## 2015-04-03 DIAGNOSIS — C029 Malignant neoplasm of tongue, unspecified: Secondary | ICD-10-CM | POA: Diagnosis not present

## 2015-04-03 DIAGNOSIS — K007 Teething syndrome: Secondary | ICD-10-CM | POA: Diagnosis not present

## 2015-04-13 DIAGNOSIS — K1379 Other lesions of oral mucosa: Secondary | ICD-10-CM | POA: Diagnosis not present

## 2015-04-13 DIAGNOSIS — C069 Malignant neoplasm of mouth, unspecified: Secondary | ICD-10-CM | POA: Diagnosis not present

## 2015-04-13 DIAGNOSIS — C801 Malignant (primary) neoplasm, unspecified: Secondary | ICD-10-CM | POA: Diagnosis not present

## 2015-04-13 DIAGNOSIS — Z01818 Encounter for other preprocedural examination: Secondary | ICD-10-CM | POA: Diagnosis not present

## 2015-04-18 DIAGNOSIS — L7622 Postprocedural hemorrhage and hematoma of skin and subcutaneous tissue following other procedure: Secondary | ICD-10-CM | POA: Diagnosis not present

## 2015-04-18 DIAGNOSIS — D473 Essential (hemorrhagic) thrombocythemia: Secondary | ICD-10-CM | POA: Diagnosis not present

## 2015-04-18 DIAGNOSIS — Z7409 Other reduced mobility: Secondary | ICD-10-CM | POA: Diagnosis not present

## 2015-04-18 DIAGNOSIS — Z87891 Personal history of nicotine dependence: Secondary | ICD-10-CM | POA: Diagnosis not present

## 2015-04-18 DIAGNOSIS — R1310 Dysphagia, unspecified: Secondary | ICD-10-CM | POA: Diagnosis not present

## 2015-04-18 DIAGNOSIS — K91840 Postprocedural hemorrhage and hematoma of a digestive system organ or structure following a digestive system procedure: Secondary | ICD-10-CM | POA: Diagnosis not present

## 2015-04-18 DIAGNOSIS — C048 Malignant neoplasm of overlapping sites of floor of mouth: Secondary | ICD-10-CM | POA: Diagnosis present

## 2015-04-18 DIAGNOSIS — D62 Acute posthemorrhagic anemia: Secondary | ICD-10-CM | POA: Diagnosis not present

## 2015-04-18 DIAGNOSIS — G4700 Insomnia, unspecified: Secondary | ICD-10-CM | POA: Diagnosis present

## 2015-04-18 DIAGNOSIS — L7634 Postprocedural seroma of skin and subcutaneous tissue following other procedure: Secondary | ICD-10-CM | POA: Diagnosis not present

## 2015-04-18 DIAGNOSIS — C801 Malignant (primary) neoplasm, unspecified: Secondary | ICD-10-CM | POA: Diagnosis not present

## 2015-04-18 DIAGNOSIS — D0000 Carcinoma in situ of oral cavity, unspecified site: Secondary | ICD-10-CM | POA: Diagnosis not present

## 2015-04-18 DIAGNOSIS — C069 Malignant neoplasm of mouth, unspecified: Secondary | ICD-10-CM | POA: Diagnosis not present

## 2015-04-18 DIAGNOSIS — F419 Anxiety disorder, unspecified: Secondary | ICD-10-CM | POA: Diagnosis present

## 2015-04-18 DIAGNOSIS — R1312 Dysphagia, oropharyngeal phase: Secondary | ICD-10-CM | POA: Diagnosis not present

## 2015-04-18 DIAGNOSIS — G4733 Obstructive sleep apnea (adult) (pediatric): Secondary | ICD-10-CM | POA: Diagnosis present

## 2015-04-18 DIAGNOSIS — T17990A Other foreign object in respiratory tract, part unspecified in causing asphyxiation, initial encounter: Secondary | ICD-10-CM | POA: Diagnosis not present

## 2015-04-18 DIAGNOSIS — Z79899 Other long term (current) drug therapy: Secondary | ICD-10-CM | POA: Diagnosis not present

## 2015-04-18 DIAGNOSIS — C760 Malignant neoplasm of head, face and neck: Secondary | ICD-10-CM | POA: Diagnosis not present

## 2015-04-18 DIAGNOSIS — Z923 Personal history of irradiation: Secondary | ICD-10-CM | POA: Diagnosis not present

## 2015-04-18 DIAGNOSIS — C411 Malignant neoplasm of mandible: Secondary | ICD-10-CM | POA: Diagnosis not present

## 2015-04-18 DIAGNOSIS — Z930 Tracheostomy status: Secondary | ICD-10-CM | POA: Diagnosis not present

## 2015-04-18 DIAGNOSIS — R1319 Other dysphagia: Secondary | ICD-10-CM | POA: Diagnosis present

## 2015-04-18 DIAGNOSIS — I959 Hypotension, unspecified: Secondary | ICD-10-CM | POA: Diagnosis not present

## 2015-04-18 DIAGNOSIS — R739 Hyperglycemia, unspecified: Secondary | ICD-10-CM | POA: Diagnosis not present

## 2015-04-18 DIAGNOSIS — R2689 Other abnormalities of gait and mobility: Secondary | ICD-10-CM | POA: Diagnosis not present

## 2015-04-18 DIAGNOSIS — R278 Other lack of coordination: Secondary | ICD-10-CM | POA: Diagnosis not present

## 2015-04-18 DIAGNOSIS — K660 Peritoneal adhesions (postprocedural) (postinfection): Secondary | ICD-10-CM | POA: Diagnosis present

## 2015-04-18 DIAGNOSIS — T8183XA Persistent postprocedural fistula, initial encounter: Secondary | ICD-10-CM | POA: Diagnosis not present

## 2015-04-18 DIAGNOSIS — F329 Major depressive disorder, single episode, unspecified: Secondary | ICD-10-CM | POA: Diagnosis present

## 2015-04-18 DIAGNOSIS — M1990 Unspecified osteoarthritis, unspecified site: Secondary | ICD-10-CM | POA: Diagnosis present

## 2015-04-18 DIAGNOSIS — D649 Anemia, unspecified: Secondary | ICD-10-CM | POA: Diagnosis not present

## 2015-04-18 DIAGNOSIS — Z9889 Other specified postprocedural states: Secondary | ICD-10-CM | POA: Diagnosis not present

## 2015-05-08 DIAGNOSIS — C069 Malignant neoplasm of mouth, unspecified: Secondary | ICD-10-CM | POA: Diagnosis not present

## 2015-05-08 DIAGNOSIS — Z7409 Other reduced mobility: Secondary | ICD-10-CM | POA: Diagnosis not present

## 2015-05-08 DIAGNOSIS — F411 Generalized anxiety disorder: Secondary | ICD-10-CM | POA: Diagnosis not present

## 2015-05-08 DIAGNOSIS — R2689 Other abnormalities of gait and mobility: Secondary | ICD-10-CM | POA: Diagnosis not present

## 2015-05-08 DIAGNOSIS — R278 Other lack of coordination: Secondary | ICD-10-CM | POA: Diagnosis not present

## 2015-05-08 DIAGNOSIS — D649 Anemia, unspecified: Secondary | ICD-10-CM | POA: Diagnosis not present

## 2015-05-08 DIAGNOSIS — Z08 Encounter for follow-up examination after completed treatment for malignant neoplasm: Secondary | ICD-10-CM | POA: Diagnosis not present

## 2015-05-08 DIAGNOSIS — Z930 Tracheostomy status: Secondary | ICD-10-CM | POA: Diagnosis not present

## 2015-05-08 DIAGNOSIS — Z431 Encounter for attention to gastrostomy: Secondary | ICD-10-CM | POA: Diagnosis not present

## 2015-05-08 DIAGNOSIS — D0000 Carcinoma in situ of oral cavity, unspecified site: Secondary | ICD-10-CM | POA: Diagnosis not present

## 2015-05-08 DIAGNOSIS — C44320 Squamous cell carcinoma of skin of unspecified parts of face: Secondary | ICD-10-CM | POA: Diagnosis not present

## 2015-05-08 DIAGNOSIS — Z87891 Personal history of nicotine dependence: Secondary | ICD-10-CM | POA: Diagnosis not present

## 2015-05-08 DIAGNOSIS — Z09 Encounter for follow-up examination after completed treatment for conditions other than malignant neoplasm: Secondary | ICD-10-CM | POA: Diagnosis not present

## 2015-05-08 DIAGNOSIS — M1991 Primary osteoarthritis, unspecified site: Secondary | ICD-10-CM | POA: Diagnosis not present

## 2015-05-08 DIAGNOSIS — F329 Major depressive disorder, single episode, unspecified: Secondary | ICD-10-CM | POA: Diagnosis not present

## 2015-05-08 DIAGNOSIS — Z9889 Other specified postprocedural states: Secondary | ICD-10-CM | POA: Diagnosis not present

## 2015-05-08 DIAGNOSIS — G4700 Insomnia, unspecified: Secondary | ICD-10-CM | POA: Diagnosis not present

## 2015-05-08 DIAGNOSIS — Z85828 Personal history of other malignant neoplasm of skin: Secondary | ICD-10-CM | POA: Diagnosis not present

## 2015-05-08 DIAGNOSIS — F419 Anxiety disorder, unspecified: Secondary | ICD-10-CM | POA: Diagnosis not present

## 2015-05-08 DIAGNOSIS — D473 Essential (hemorrhagic) thrombocythemia: Secondary | ICD-10-CM | POA: Diagnosis not present

## 2015-05-08 DIAGNOSIS — R609 Edema, unspecified: Secondary | ICD-10-CM | POA: Diagnosis not present

## 2015-05-08 DIAGNOSIS — Z9049 Acquired absence of other specified parts of digestive tract: Secondary | ICD-10-CM | POA: Diagnosis not present

## 2015-05-08 DIAGNOSIS — F330 Major depressive disorder, recurrent, mild: Secondary | ICD-10-CM | POA: Diagnosis not present

## 2015-05-08 DIAGNOSIS — Z483 Aftercare following surgery for neoplasm: Secondary | ICD-10-CM | POA: Diagnosis not present

## 2015-05-08 DIAGNOSIS — C148 Malignant neoplasm of overlapping sites of lip, oral cavity and pharynx: Secondary | ICD-10-CM | POA: Diagnosis not present

## 2015-05-08 DIAGNOSIS — R1312 Dysphagia, oropharyngeal phase: Secondary | ICD-10-CM | POA: Diagnosis not present

## 2015-05-09 DIAGNOSIS — D473 Essential (hemorrhagic) thrombocythemia: Secondary | ICD-10-CM | POA: Diagnosis not present

## 2015-05-09 DIAGNOSIS — C069 Malignant neoplasm of mouth, unspecified: Secondary | ICD-10-CM | POA: Diagnosis not present

## 2015-05-09 DIAGNOSIS — D649 Anemia, unspecified: Secondary | ICD-10-CM | POA: Diagnosis not present

## 2015-05-15 DIAGNOSIS — R1312 Dysphagia, oropharyngeal phase: Secondary | ICD-10-CM | POA: Diagnosis not present

## 2015-05-15 DIAGNOSIS — Z08 Encounter for follow-up examination after completed treatment for malignant neoplasm: Secondary | ICD-10-CM | POA: Diagnosis not present

## 2015-05-15 DIAGNOSIS — Z930 Tracheostomy status: Secondary | ICD-10-CM | POA: Diagnosis not present

## 2015-05-15 DIAGNOSIS — Z85828 Personal history of other malignant neoplasm of skin: Secondary | ICD-10-CM | POA: Diagnosis not present

## 2015-05-15 DIAGNOSIS — Z9049 Acquired absence of other specified parts of digestive tract: Secondary | ICD-10-CM | POA: Diagnosis not present

## 2015-05-21 DIAGNOSIS — F411 Generalized anxiety disorder: Secondary | ICD-10-CM | POA: Diagnosis not present

## 2015-05-21 DIAGNOSIS — D649 Anemia, unspecified: Secondary | ICD-10-CM | POA: Diagnosis not present

## 2015-05-25 DIAGNOSIS — Z09 Encounter for follow-up examination after completed treatment for conditions other than malignant neoplasm: Secondary | ICD-10-CM | POA: Diagnosis not present

## 2015-05-25 DIAGNOSIS — F419 Anxiety disorder, unspecified: Secondary | ICD-10-CM | POA: Diagnosis not present

## 2015-05-25 DIAGNOSIS — R609 Edema, unspecified: Secondary | ICD-10-CM | POA: Diagnosis not present

## 2015-05-25 DIAGNOSIS — F329 Major depressive disorder, single episode, unspecified: Secondary | ICD-10-CM | POA: Diagnosis not present

## 2015-05-25 DIAGNOSIS — G4700 Insomnia, unspecified: Secondary | ICD-10-CM | POA: Diagnosis not present

## 2015-05-25 DIAGNOSIS — R1312 Dysphagia, oropharyngeal phase: Secondary | ICD-10-CM | POA: Diagnosis not present

## 2015-05-25 DIAGNOSIS — Z9889 Other specified postprocedural states: Secondary | ICD-10-CM | POA: Diagnosis not present

## 2015-05-30 DIAGNOSIS — D473 Essential (hemorrhagic) thrombocythemia: Secondary | ICD-10-CM | POA: Diagnosis not present

## 2015-05-30 DIAGNOSIS — C069 Malignant neoplasm of mouth, unspecified: Secondary | ICD-10-CM | POA: Diagnosis not present

## 2015-05-30 DIAGNOSIS — D649 Anemia, unspecified: Secondary | ICD-10-CM | POA: Diagnosis not present

## 2015-06-05 DIAGNOSIS — C069 Malignant neoplasm of mouth, unspecified: Secondary | ICD-10-CM | POA: Diagnosis not present

## 2015-06-05 DIAGNOSIS — Z9889 Other specified postprocedural states: Secondary | ICD-10-CM | POA: Diagnosis not present

## 2015-06-05 DIAGNOSIS — R1312 Dysphagia, oropharyngeal phase: Secondary | ICD-10-CM | POA: Diagnosis not present

## 2015-06-08 DIAGNOSIS — C148 Malignant neoplasm of overlapping sites of lip, oral cavity and pharynx: Secondary | ICD-10-CM | POA: Diagnosis not present

## 2015-06-08 DIAGNOSIS — Z483 Aftercare following surgery for neoplasm: Secondary | ICD-10-CM | POA: Diagnosis not present

## 2015-06-08 DIAGNOSIS — D649 Anemia, unspecified: Secondary | ICD-10-CM | POA: Diagnosis not present

## 2015-06-08 DIAGNOSIS — Z431 Encounter for attention to gastrostomy: Secondary | ICD-10-CM | POA: Diagnosis not present

## 2015-06-08 DIAGNOSIS — R1312 Dysphagia, oropharyngeal phase: Secondary | ICD-10-CM | POA: Diagnosis not present

## 2015-06-08 DIAGNOSIS — C44320 Squamous cell carcinoma of skin of unspecified parts of face: Secondary | ICD-10-CM | POA: Diagnosis not present

## 2015-06-13 DIAGNOSIS — Z931 Gastrostomy status: Secondary | ICD-10-CM | POA: Diagnosis not present

## 2015-06-13 DIAGNOSIS — F411 Generalized anxiety disorder: Secondary | ICD-10-CM | POA: Diagnosis not present

## 2015-06-13 DIAGNOSIS — C760 Malignant neoplasm of head, face and neck: Secondary | ICD-10-CM | POA: Diagnosis not present

## 2015-06-16 DIAGNOSIS — C148 Malignant neoplasm of overlapping sites of lip, oral cavity and pharynx: Secondary | ICD-10-CM | POA: Diagnosis not present

## 2015-06-16 DIAGNOSIS — Z431 Encounter for attention to gastrostomy: Secondary | ICD-10-CM | POA: Diagnosis not present

## 2015-06-16 DIAGNOSIS — C44320 Squamous cell carcinoma of skin of unspecified parts of face: Secondary | ICD-10-CM | POA: Diagnosis not present

## 2015-06-16 DIAGNOSIS — R1312 Dysphagia, oropharyngeal phase: Secondary | ICD-10-CM | POA: Diagnosis not present

## 2015-06-16 DIAGNOSIS — Z483 Aftercare following surgery for neoplasm: Secondary | ICD-10-CM | POA: Diagnosis not present

## 2015-06-16 DIAGNOSIS — D649 Anemia, unspecified: Secondary | ICD-10-CM | POA: Diagnosis not present

## 2015-06-22 DIAGNOSIS — Z483 Aftercare following surgery for neoplasm: Secondary | ICD-10-CM | POA: Diagnosis not present

## 2015-06-22 DIAGNOSIS — Z431 Encounter for attention to gastrostomy: Secondary | ICD-10-CM | POA: Diagnosis not present

## 2015-06-22 DIAGNOSIS — C148 Malignant neoplasm of overlapping sites of lip, oral cavity and pharynx: Secondary | ICD-10-CM | POA: Diagnosis not present

## 2015-06-22 DIAGNOSIS — C44320 Squamous cell carcinoma of skin of unspecified parts of face: Secondary | ICD-10-CM | POA: Diagnosis not present

## 2015-06-22 DIAGNOSIS — R1312 Dysphagia, oropharyngeal phase: Secondary | ICD-10-CM | POA: Diagnosis not present

## 2015-06-22 DIAGNOSIS — D649 Anemia, unspecified: Secondary | ICD-10-CM | POA: Diagnosis not present

## 2015-06-28 DIAGNOSIS — Z9889 Other specified postprocedural states: Secondary | ICD-10-CM | POA: Diagnosis not present

## 2015-06-28 DIAGNOSIS — C069 Malignant neoplasm of mouth, unspecified: Secondary | ICD-10-CM | POA: Diagnosis not present

## 2015-07-06 DIAGNOSIS — D649 Anemia, unspecified: Secondary | ICD-10-CM | POA: Diagnosis not present

## 2015-07-06 DIAGNOSIS — Z483 Aftercare following surgery for neoplasm: Secondary | ICD-10-CM | POA: Diagnosis not present

## 2015-07-06 DIAGNOSIS — C148 Malignant neoplasm of overlapping sites of lip, oral cavity and pharynx: Secondary | ICD-10-CM | POA: Diagnosis not present

## 2015-07-06 DIAGNOSIS — Z431 Encounter for attention to gastrostomy: Secondary | ICD-10-CM | POA: Diagnosis not present

## 2015-07-06 DIAGNOSIS — R1312 Dysphagia, oropharyngeal phase: Secondary | ICD-10-CM | POA: Diagnosis not present

## 2015-07-06 DIAGNOSIS — C44320 Squamous cell carcinoma of skin of unspecified parts of face: Secondary | ICD-10-CM | POA: Diagnosis not present

## 2015-07-18 DIAGNOSIS — Z483 Aftercare following surgery for neoplasm: Secondary | ICD-10-CM | POA: Diagnosis not present

## 2015-07-18 DIAGNOSIS — C44320 Squamous cell carcinoma of skin of unspecified parts of face: Secondary | ICD-10-CM | POA: Diagnosis not present

## 2015-07-18 DIAGNOSIS — Z431 Encounter for attention to gastrostomy: Secondary | ICD-10-CM | POA: Diagnosis not present

## 2015-07-18 DIAGNOSIS — D649 Anemia, unspecified: Secondary | ICD-10-CM | POA: Diagnosis not present

## 2015-07-18 DIAGNOSIS — C148 Malignant neoplasm of overlapping sites of lip, oral cavity and pharynx: Secondary | ICD-10-CM | POA: Diagnosis not present

## 2015-07-18 DIAGNOSIS — R1312 Dysphagia, oropharyngeal phase: Secondary | ICD-10-CM | POA: Diagnosis not present

## 2015-07-24 IMAGING — US US BIOPSY
1 series · 10 of 10 positions shown · non-contrast
Comparison: none

CLINICAL DATA: Patient with history of squamous cell carcinoma of
the mandible and recent CT of the neck demonstrating a right thyroid
nodule. Subsequent thyroid ultrasound on 01/14/2014 revealed a
dominant 2.2 cm right thyroid nodule. Request is now made for needle
aspirate biopsy of this dominant right thyroid nodule.

[Series 1: us biopsy · 0.06mm/px · 10 acquisitions, 10 frames shown]
[im 1/10]
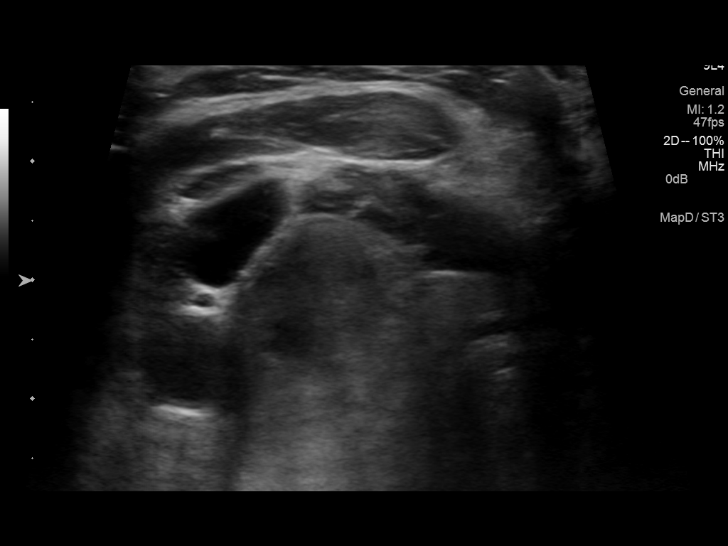
[im 2/10]
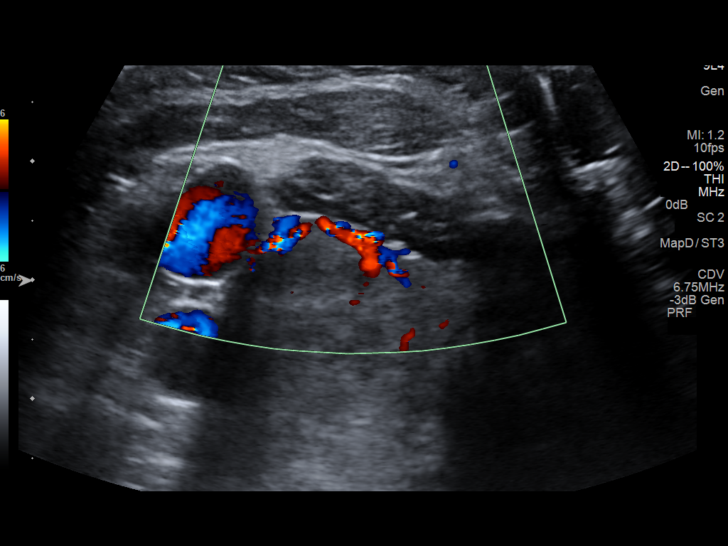
[im 3/10]
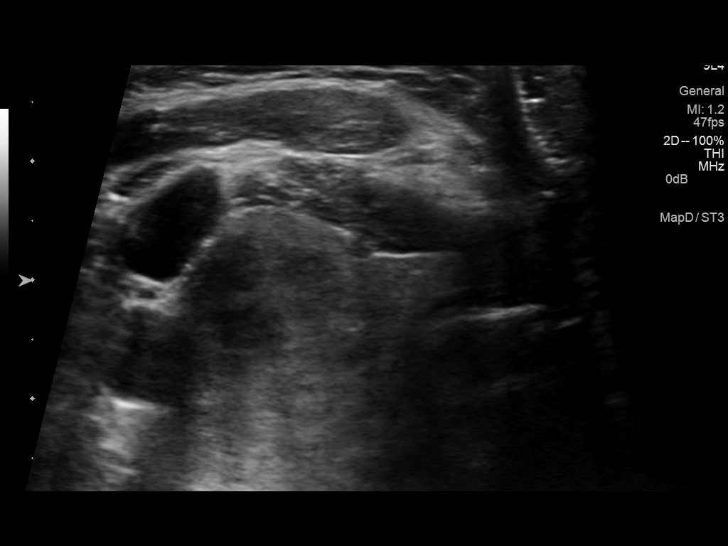
[im 4/10]
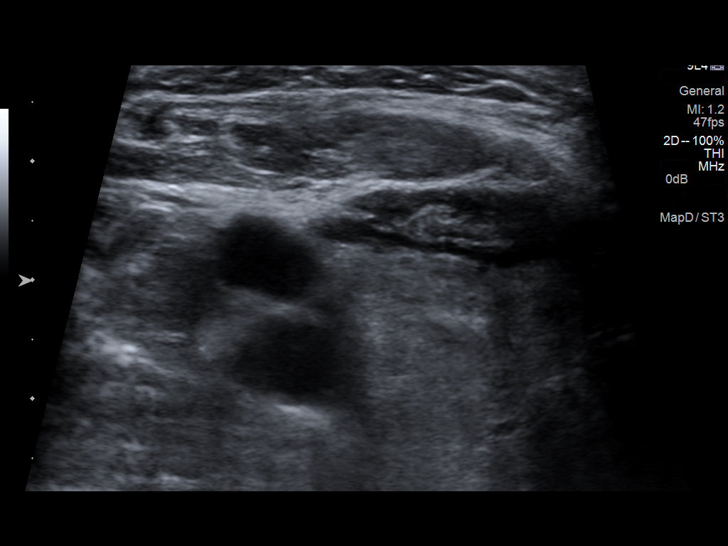
[im 5/10]
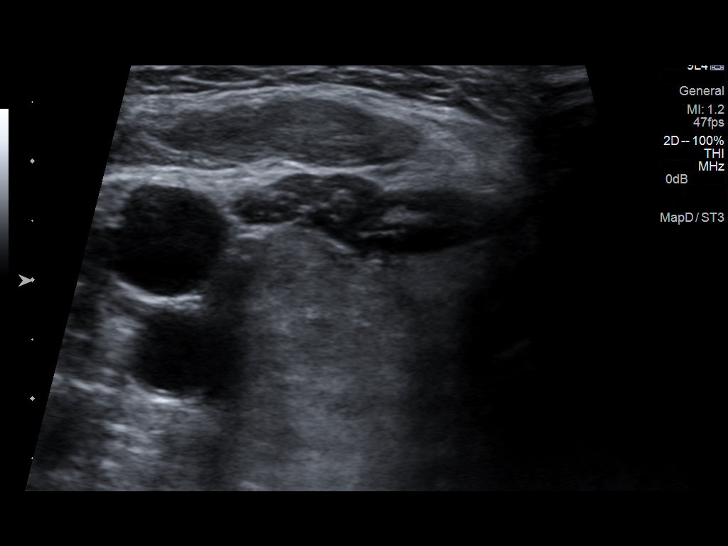
[im 6/10]
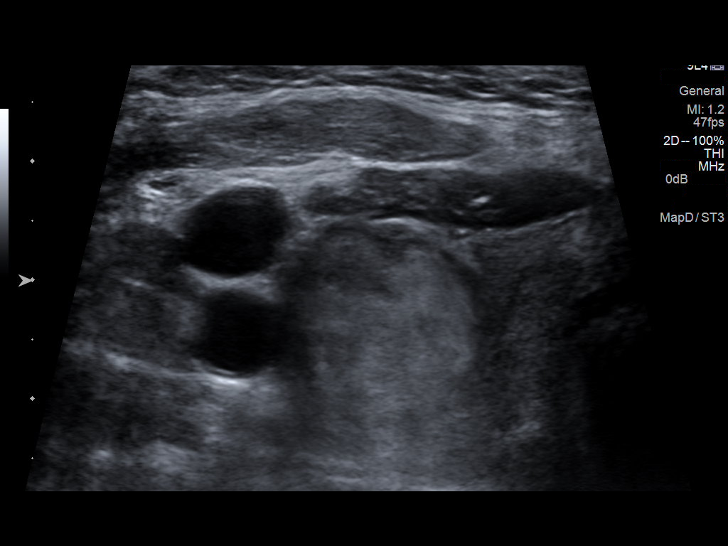
[im 7/10]
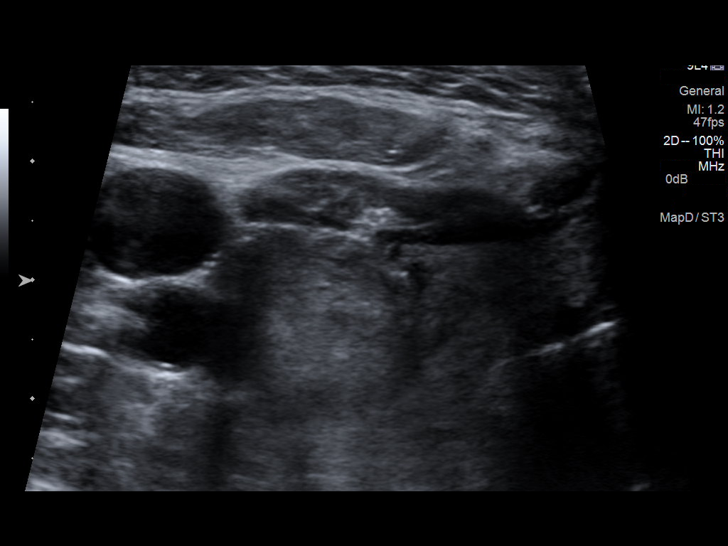
[im 8/10]
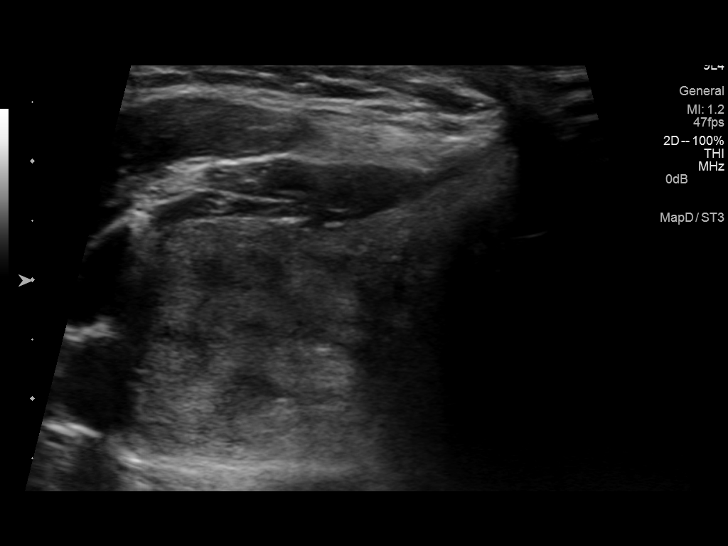
[im 9/10]
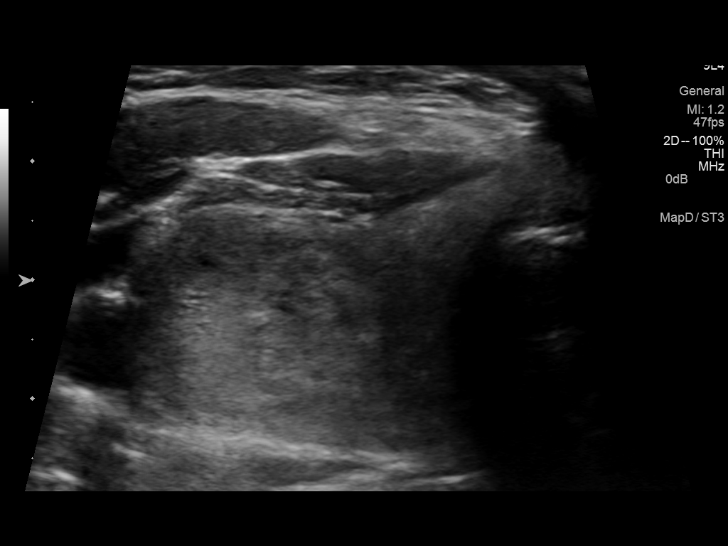
[im 10/10]
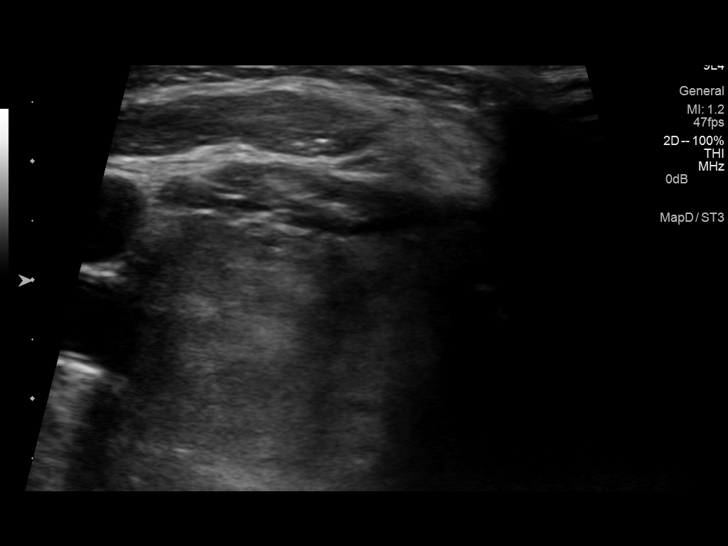

[10 of 10 positions shown; findings below may reference images not displayed]

EXAM:
ULTRASOUND GUIDED NEEDLE ASPIRATE BIOPSY OF DOMINANT RIGHT THYROID
NODULE

MEDICATIONS:
NONE.

PROCEDURE:
The procedure, risks, benefits, and alternatives were explained to
the patient. Questions regarding the procedure were encouraged and
answered. The patient understands and consents to the procedure.

Ultrasound was performed to localize and mark an adequate site for
the biopsy. The patient was then prepped and draped in a normal
sterile fashion. Local anesthesia was provided with 1% lidocaine.
Under direct ultrasound guidance, 4 passes were made using 25 gauge
needles into the dominant nodule within the right lobe of the
thyroid. Ultrasound was used to confirm needle placements on all
occasions. Specimens were sent to pathology for analysis.

COMPLICATIONS:
None.
FINDINGS: Dominant 2.2 cm right thyroid nodule. Smaller 1.0 cm left thyroid
nodule.
IMPRESSION: Ultrasound-guided needle aspirate biopsy performed of the dominant
right thyroid nodule. Pathology pending.

## 2015-08-04 DIAGNOSIS — C44320 Squamous cell carcinoma of skin of unspecified parts of face: Secondary | ICD-10-CM | POA: Diagnosis not present

## 2015-08-04 DIAGNOSIS — C148 Malignant neoplasm of overlapping sites of lip, oral cavity and pharynx: Secondary | ICD-10-CM | POA: Diagnosis not present

## 2015-08-04 DIAGNOSIS — D649 Anemia, unspecified: Secondary | ICD-10-CM | POA: Diagnosis not present

## 2015-08-04 DIAGNOSIS — Z483 Aftercare following surgery for neoplasm: Secondary | ICD-10-CM | POA: Diagnosis not present

## 2015-08-04 DIAGNOSIS — Z431 Encounter for attention to gastrostomy: Secondary | ICD-10-CM | POA: Diagnosis not present

## 2015-08-04 DIAGNOSIS — R1312 Dysphagia, oropharyngeal phase: Secondary | ICD-10-CM | POA: Diagnosis not present

## 2015-08-30 DIAGNOSIS — Z9089 Acquired absence of other organs: Secondary | ICD-10-CM | POA: Diagnosis not present

## 2015-08-30 DIAGNOSIS — R51 Headache: Secondary | ICD-10-CM | POA: Diagnosis not present

## 2015-08-30 DIAGNOSIS — Z4889 Encounter for other specified surgical aftercare: Secondary | ICD-10-CM | POA: Diagnosis not present

## 2015-08-30 DIAGNOSIS — C069 Malignant neoplasm of mouth, unspecified: Secondary | ICD-10-CM | POA: Diagnosis not present

## 2015-08-30 DIAGNOSIS — Z8589 Personal history of malignant neoplasm of other organs and systems: Secondary | ICD-10-CM | POA: Diagnosis not present

## 2015-08-30 DIAGNOSIS — R6884 Jaw pain: Secondary | ICD-10-CM | POA: Diagnosis not present

## 2015-08-30 DIAGNOSIS — C801 Malignant (primary) neoplasm, unspecified: Secondary | ICD-10-CM | POA: Diagnosis not present

## 2015-09-18 DIAGNOSIS — Z9889 Other specified postprocedural states: Secondary | ICD-10-CM | POA: Diagnosis not present

## 2015-09-18 DIAGNOSIS — Z08 Encounter for follow-up examination after completed treatment for malignant neoplasm: Secondary | ICD-10-CM | POA: Diagnosis not present

## 2015-09-18 DIAGNOSIS — E041 Nontoxic single thyroid nodule: Secondary | ICD-10-CM | POA: Diagnosis not present

## 2015-09-18 DIAGNOSIS — C069 Malignant neoplasm of mouth, unspecified: Secondary | ICD-10-CM | POA: Diagnosis not present

## 2015-09-18 DIAGNOSIS — Z85819 Personal history of malignant neoplasm of unspecified site of lip, oral cavity, and pharynx: Secondary | ICD-10-CM | POA: Diagnosis not present

## 2015-10-12 DIAGNOSIS — T8469XA Infection and inflammatory reaction due to internal fixation device of other site, initial encounter: Secondary | ICD-10-CM | POA: Diagnosis not present

## 2015-10-12 DIAGNOSIS — C069 Malignant neoplasm of mouth, unspecified: Secondary | ICD-10-CM | POA: Diagnosis not present

## 2015-10-12 DIAGNOSIS — T847XXA Infection and inflammatory reaction due to other internal orthopedic prosthetic devices, implants and grafts, initial encounter: Secondary | ICD-10-CM | POA: Diagnosis not present

## 2015-10-12 DIAGNOSIS — Z87891 Personal history of nicotine dependence: Secondary | ICD-10-CM | POA: Diagnosis not present

## 2015-11-06 DIAGNOSIS — Z483 Aftercare following surgery for neoplasm: Secondary | ICD-10-CM | POA: Diagnosis not present

## 2015-11-06 DIAGNOSIS — Z4802 Encounter for removal of sutures: Secondary | ICD-10-CM | POA: Diagnosis not present

## 2015-11-06 DIAGNOSIS — E041 Nontoxic single thyroid nodule: Secondary | ICD-10-CM | POA: Diagnosis not present

## 2015-11-06 DIAGNOSIS — F329 Major depressive disorder, single episode, unspecified: Secondary | ICD-10-CM | POA: Diagnosis not present

## 2015-12-18 ENCOUNTER — Other Ambulatory Visit: Payer: Self-pay | Admitting: Radiation Oncology

## 2015-12-18 ENCOUNTER — Telehealth: Payer: Self-pay | Admitting: *Deleted

## 2015-12-18 DIAGNOSIS — C411 Malignant neoplasm of mandible: Secondary | ICD-10-CM

## 2015-12-18 NOTE — Telephone Encounter (Signed)
CALLED PATIENT TO INFORM OF SURVIVORSHIP APPT. WITH Theresa Stephens ON 12-22-15  2 12  PM, LVM FOR A RETURN CALL

## 2015-12-22 ENCOUNTER — Telehealth: Payer: Self-pay | Admitting: *Deleted

## 2015-12-22 ENCOUNTER — Other Ambulatory Visit: Payer: Self-pay | Admitting: Adult Health

## 2015-12-22 ENCOUNTER — Ambulatory Visit (HOSPITAL_BASED_OUTPATIENT_CLINIC_OR_DEPARTMENT_OTHER): Payer: Medicare Other | Admitting: Adult Health

## 2015-12-22 VITALS — BP 114/73 | HR 85 | Temp 98.6°F | Resp 18 | Ht 60.0 in | Wt 128.6 lb

## 2015-12-22 DIAGNOSIS — Z23 Encounter for immunization: Secondary | ICD-10-CM

## 2015-12-22 DIAGNOSIS — Z1231 Encounter for screening mammogram for malignant neoplasm of breast: Secondary | ICD-10-CM

## 2015-12-22 DIAGNOSIS — C049 Malignant neoplasm of floor of mouth, unspecified: Secondary | ICD-10-CM | POA: Diagnosis not present

## 2015-12-22 DIAGNOSIS — K029 Dental caries, unspecified: Secondary | ICD-10-CM

## 2015-12-22 DIAGNOSIS — C411 Malignant neoplasm of mandible: Secondary | ICD-10-CM

## 2015-12-22 MED ORDER — INFLUENZA VAC SPLIT QUAD 0.5 ML IM SUSY
0.5000 mL | PREFILLED_SYRINGE | Freq: Once | INTRAMUSCULAR | Status: AC
  Administered 2015-12-22: 0.5 mL via INTRAMUSCULAR
  Filled 2015-12-22: qty 0.5

## 2015-12-22 NOTE — Telephone Encounter (Signed)
Called pt's daughter to make her aware of appts : [facility] and appt with Dr. Maurice Small on 08/23/16/@ 10:30a. Pt's daughter read back appt dates and times. No further concerns. Message to be fwd to [employer].

## 2015-12-22 NOTE — Progress Notes (Signed)
CLINIC:  Survivorship  REASON FOR VISIT:  Routine follow-up for head & neck cancer.   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 16 testing is pending      03/19/2013 Imaging    PET/CT scan confirmed hypermetabolic activity in the mandible as well as adjacent ipsilateral lymph nodes. There are 2 pulmonary nodules noted of unknown etiology.        INTERVAL HISTORY:  Theresa Stephens presents to clinic today for evaluation after having not been seen at the cancer center for about 18+ months; she was last seen by Dr. Isidore Moos on 04/22/14. Since her last visit the patient tells me that she had recurrent cancer to her floor of mouth, diagnosed in 04/2015. Her presentation at the time of this recurrence was toothache.  She tells me "they removed most of my tongue" and she had floor of mouth resection with flap reconstruction at [facility] with Dr. Vicie Stephens.  She tells me that she had scans that were "clear" after surgery per her report.   She is now completely NPO; all of her nutrition and fluid intake comes from the G-tube. She instills 5 cans of tube feeding per day with intermittent water flushes. She tells me her weight has been stable; she tells me she actually has gained weight. Her energy levels are pretty good. She was started on Prozac relatively recently, to help improve her mood. She feels like this medication is helping. She also has BuSpar to use when necessary for  anxiety.  She has intermittent right jaw pain, which she describes as "not that bad." She generally does not take any pain medication, "except only when I really need it, which isn't too much."  She tells me she feels like she has fluid in her ears, but denies right ear pain. Theresa Stephens saw Dr. Vicie Stephens about 6 months ago; she is not sure when she is supposed to see Dr. Vicie Stephens again.  She expresses concerns regarding her teeth. She tells me they are "awful." She feels like she needs multiple extractions. The decaying teeth cause her some oral pain at times.    ADDITIONAL REVIEW OF SYSTEMS:  Review of Systems  Constitutional: Positive for malaise/fatigue. Negative for fever.       (+) fatigue  HENT:       Endorses sinus problems. "I feel like I have fluid in my ears."  Eyes: Positive for blurred vision.       "I need to get my eyes checked"  Gastrointestinal: Negative.   Genitourinary:       Occasional dribbling urine.  Musculoskeletal: Positive for joint pain.  Skin: Negative.   Psychiatric/Behavioral: Positive for depression. The patient is nervous/anxious.      CURRENT MEDICATIONS:  Current Outpatient Prescriptions on File Prior to Visit  Medication Sig Dispense Refill  . acetaminophen (TYLENOL) 500 MG tablet Take 500 mg by mouth every 6 (six) hours as  needed for moderate pain.    Marland Kitchen HYDROcodone-acetaminophen (HYCET) 7.5-325 mg/15 ml solution Take 10 mLs by mouth every 4 (four) hours as needed for severe pain. (Patient not taking: Reported on 12/22/2015) 118 mL 0   No current facility-administered medications on file prior to visit.     ALLERGIES:  No Known Allergies   PHYSICAL EXAM:  Vitals:   12/22/15 1226  BP: 114/73  Pulse: 85  Resp: 18  Temp: 98.6 F (37 C)   Filed Weights   12/22/15 1226  Weight: 128 lb 9.6 oz (58.3 kg)   Weight Date  128 lb 9.6 oz (58.3 kg)  12/22/15  125 lb 3.2 oz (56.79 kg). 04/22/14   130 lb 1.6 oz (59.013 kg).  12/31/13  130 lb 14.4  oz (59.376 kg).  09/10/13  139 lb 11.2 oz (63.368 kg). 07/12/13  141 lb 4.8 oz (64.093 kg). Pre-treatment (RT consult date): 06/23/13   General: Chronically-ill appearing female in no acute distress.  Accompanied by her daughter & infant grandson today.  HEENT: Head is atraumatic and normocephalic.  Pupils equal and reactive to light. Conjunctivae clear without exudate.  Sclerae anicteric. (L) TM difficult to visualize given mild cerumen impaction.  (R) TM with evidence of middle ear effusion/slightly bulging; no obvious signs of infection.  Oral exam difficult given multiple surgeries. Flap reconstruction to floor of mouth in place without evidence of necrosis.  Remaining teeth very decayed/in poor repair.  Lymph: No preauricular, postauricular, cervical, supraclavicular, or infraclavicular lymphadenopathy noted on palpation.   Neck: Diffuse fibrosis to neck. No appreciable/palpable masses. Mild anterior neck lymphedema. Cardiovascular: Normal rate and rhythm. Respiratory: Clear to auscultation. Bases diminished. Chest expansion symmetric withou accessory muscle use; breathing non-labored.  GI: Abdomen soft and round. No tenderness to palpation. Bowel sounds normoactive.  GU: Deferred.   Neuro: No focal deficits. Steady gait.  Alert and oriented; speech garbled given H&N surgeries.  Psych: Normal mood and affect for situation. Extremities: No edema.  Skin: Warm and dry.    LABORATORY DATA:  None at this visit.  DIAGNOSTIC IMAGING:  Most recent CT 08/30/15 [facility])   ASSESSMENT & PLAN:  Theresa Stephens is a very pleasant 65 y.o. 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 Vicie Stephens at [facility] (of note required tracheotomy d/t difficult to intubate), followed by radiation therapy; completed treatment on 08/17/13.  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 Vicie Stephens (of note, pt underwent tracheotomy again for this surgery which was reversed post-op).  Patient presents to survivorship clinic today for routine follow-up and to re-establish care.   1. Cancer of right mandibular alveolar ridge:  Given the complexity of Ms. Haroon's case & her recent recurrence, I will defer her follow-up schedule to Dr. Isidore Moos. I shared with the patient that we will make arrangements for her to see Dr. Isidore Moos in about 6 months. I did give her my card today, and encouraged she or her daughters to call me with any questions or concerns they may have before their next visit at the cancer center.  2. Floor of mouth cancer: Care[facility] reviewed in detail.  Cancer noted to be T4aN0 invasive moderately differentiated squamous cell carcinoma without PNI. On 04/18/15, patient underwent a total  glossectomy, right and anterior mandibulectomy, left parascapular osteocutaneous free flap floor of mouth and mandible reconstruction, and tracheotomy. Postoperative course was complicated by neck wound infection with enterobacter aerogenes, peptostreptococcus anaerobius, and streptococcus anginosus.  On 04/24/15 she underwent right neck exploration with repair of oral cutaneous fistula and control of bleeding. Irrigation and debridement of right neck fistula was done. She was discharged on 05/02/15 to SNF for continued recovery and rehabilitation. Lurline Idol was removed on 06/05/15.  On 08/30/15, she underwent CT neck for surveillance, there was noted to be postop changes, but no obvious signs of tumor. At her last follow-up with Dr. Vicie Stephens on 09/18/15, she was noted to have intraoral plate exposure with associated inflammation; this is likely going to require an additional procedure in the future per Dr. Vicie Stephens.  3. Severe dental  decay: Ms. Dalsanto has severe dental decay on exam. She also endorses occasional dental pain. Given the complexity of her multiple oral surgeries and history of radiation therapy, I will refer her to Dr. Enrique Sack for evaluation and treatment of her dental decay. I will ask Gayleen Orem, RN to help facilitate making this appointment for the patient.  4. Right middle ear effusion: The patient is not sure when she is supposed to follow-up with Dr. Vicie Stephens. I encouraged her to reach out to his office, to see if he may be able to see her sooner than previously scheduled. I shared that he likely would be the best resource to help manage the fluid in her ears, as clinically indicated. I did not find any evidence of overt otitis media on physical exam today, therefore I will defer management of this patient's ENT concerns to Dr. Vicie Stephens.  5.  Health maintenance/wellness promotion: We discussed the importance of overall health maintenance as a cancer survivor. She tells me that she has never had a mammogram. She expresses fear of undergoing mammography, as one of her daughters died with breast cancer. I shared with the patient and her daughter the procedure for mammogram in the importance of screening for breast cancer. She agreed to have the mammogram. Therefore orders placed today for bilateral screening tomo mammogram at the [place]. I stressed the importance of her overall health and how cancer screenings play a role in health maintenance. She tells me she does have a PCP; her care team with updated and affect today. However she has not seen her PCP, nor has she had an annual physical in quite some time. We discussed the importance of overall health maintenance with routine physicals with primary care. She voiced understanding and agreed to have our office contact her PCP for next available appointment for a physical. She also has not had a colonoscopy in quite some time. I offered a GI referral  today, but the patient prefers to just talk about this with her PCP when she sees them for her physical. I shared with her that this is reasonable and we will be in touch with her when we get her set up to see her PCP soon. She did receive her annual flu vaccine during this clinic visit today.  I encouraged her to call me with any questions or concerns. She agreed with the above plan of care.  Ms. Kost and her daughter were encouraged to ask questions. All questions were answered to their stated satisfaction. They know to call me with any questions or concerns before their next appointment here.  Dispo:  -Return to cancer center in 6 months to see  Dr. Isidore Moos. -Referral to Dr. Enrique Sack for evaluation and treatment of dental decay.  -Referral back to PCP for physical and to discuss colonoscopy. -Screening mammogram at the [facility] sometime within the next few weeks.  -Encouraged patient's daughter to call Dr. Vicie Stephens ASAP to see if they may get a sooner appointment to evaluate her ears.  --She is welcome to return to the Survivorship Clinic in the future, as appropriate.    Of note: Given patient's h/o glossectomy, it is difficult for her to communicate via phone.  Therefore, please contact her daughters for appointments or other needs.   Magdalene RiverGK:[id number] Michelle PiperMC:[id number]    A total of 50 minutes was spent in the face-to-face care of this patient, with greater than 50% of that time spent in counseling and care-coordination.   Mike Craze, NP [place] [phone]

## 2015-12-25 ENCOUNTER — Ambulatory Visit: Payer: Medicare Other | Admitting: Radiation Oncology

## 2015-12-25 ENCOUNTER — Encounter: Payer: Self-pay | Admitting: Adult Health

## 2015-12-27 ENCOUNTER — Telehealth: Payer: Self-pay | Admitting: *Deleted

## 2015-12-27 NOTE — Telephone Encounter (Addendum)
Oncology Nurse Navigator Documentation  LVMM for and sent email to [facility] Dr. Waynard Edwards nurse navigator April Holding requesting guidance regarding dental care coordination.  Gayleen Orem, RN, BSN, [place] at [place] [phone]

## 2015-12-28 ENCOUNTER — Telehealth: Payer: Self-pay | Admitting: *Deleted

## 2015-12-28 NOTE — Telephone Encounter (Addendum)
Oncology Nurse Navigator Documentation  Received email from [place] indicating Theresa Stephens has appointment 11/9 at 2pm to address dental concerns.  Gayleen Orem, RN, BSN, [place] at [place] [phone]

## 2016-01-16 ENCOUNTER — Ambulatory Visit: Payer: Medicare Other

## 2016-02-07 DIAGNOSIS — H73891 Other specified disorders of tympanic membrane, right ear: Secondary | ICD-10-CM | POA: Diagnosis not present

## 2016-02-07 DIAGNOSIS — Z08 Encounter for follow-up examination after completed treatment for malignant neoplasm: Secondary | ICD-10-CM | POA: Diagnosis not present

## 2016-02-07 DIAGNOSIS — S01502A Unspecified open wound of oral cavity, initial encounter: Secondary | ICD-10-CM | POA: Diagnosis not present

## 2016-02-07 DIAGNOSIS — E041 Nontoxic single thyroid nodule: Secondary | ICD-10-CM | POA: Diagnosis not present

## 2016-02-07 DIAGNOSIS — Z859 Personal history of malignant neoplasm, unspecified: Secondary | ICD-10-CM | POA: Diagnosis not present

## 2016-02-07 DIAGNOSIS — H6521 Chronic serous otitis media, right ear: Secondary | ICD-10-CM | POA: Diagnosis not present

## 2016-02-07 DIAGNOSIS — Z9889 Other specified postprocedural states: Secondary | ICD-10-CM | POA: Diagnosis not present

## 2016-02-07 DIAGNOSIS — C069 Malignant neoplasm of mouth, unspecified: Secondary | ICD-10-CM | POA: Diagnosis not present

## 2016-02-15 DIAGNOSIS — M898X8 Other specified disorders of bone, other site: Secondary | ICD-10-CM | POA: Diagnosis not present

## 2016-02-15 DIAGNOSIS — T888XXA Other specified complications of surgical and medical care, not elsewhere classified, initial encounter: Secondary | ICD-10-CM | POA: Diagnosis not present

## 2016-02-15 DIAGNOSIS — S01502A Unspecified open wound of oral cavity, initial encounter: Secondary | ICD-10-CM | POA: Diagnosis not present

## 2016-02-28 DIAGNOSIS — Z483 Aftercare following surgery for neoplasm: Secondary | ICD-10-CM | POA: Diagnosis not present

## 2016-02-28 DIAGNOSIS — C069 Malignant neoplasm of mouth, unspecified: Secondary | ICD-10-CM | POA: Diagnosis not present

## 2016-06-26 ENCOUNTER — Ambulatory Visit
Admission: RE | Admit: 2016-06-26 | Discharge: 2016-06-26 | Disposition: A | Discharge status: Home / Self Care | Payer: Medicare Other | Source: Ambulatory Visit | Attending: Radiation Oncology | Admitting: Radiation Oncology

## 2018-02-27 DIAGNOSIS — Z9889 Other specified postprocedural states: Secondary | ICD-10-CM | POA: Diagnosis not present

## 2018-02-27 DIAGNOSIS — L03211 Cellulitis of face: Secondary | ICD-10-CM | POA: Diagnosis not present

## 2018-03-23 DIAGNOSIS — C069 Malignant neoplasm of mouth, unspecified: Secondary | ICD-10-CM | POA: Diagnosis not present

## 2018-05-18 DIAGNOSIS — Z9889 Other specified postprocedural states: Secondary | ICD-10-CM | POA: Diagnosis not present

## 2018-05-18 DIAGNOSIS — Z85818 Personal history of malignant neoplasm of other sites of lip, oral cavity, and pharynx: Secondary | ICD-10-CM | POA: Diagnosis not present

## 2018-05-18 DIAGNOSIS — Z85819 Personal history of malignant neoplasm of unspecified site of lip, oral cavity, and pharynx: Secondary | ICD-10-CM | POA: Diagnosis not present

## 2018-05-18 DIAGNOSIS — Z08 Encounter for follow-up examination after completed treatment for malignant neoplasm: Secondary | ICD-10-CM | POA: Diagnosis not present

## 2018-11-09 DIAGNOSIS — C069 Malignant neoplasm of mouth, unspecified: Secondary | ICD-10-CM | POA: Diagnosis not present

## 2018-11-09 DIAGNOSIS — Z85819 Personal history of malignant neoplasm of unspecified site of lip, oral cavity, and pharynx: Secondary | ICD-10-CM | POA: Diagnosis not present

## 2019-08-26 ENCOUNTER — Other Ambulatory Visit: Payer: Self-pay

## 2019-08-26 ENCOUNTER — Encounter (HOSPITAL_COMMUNITY): Payer: Self-pay | Admitting: Emergency Medicine

## 2019-08-26 ENCOUNTER — Inpatient Hospital Stay (HOSPITAL_COMMUNITY): Payer: Medicare Other | Admitting: Certified Registered Nurse Anesthetist

## 2019-08-26 ENCOUNTER — Inpatient Hospital Stay (HOSPITAL_COMMUNITY)
Admission: EM | Admit: 2019-08-26 | Discharge: 2019-08-29 | DRG: 378 | Disposition: A | Discharge status: Home / Self Care | Payer: Medicare Other | Attending: Internal Medicine | Admitting: Internal Medicine

## 2019-08-26 ENCOUNTER — Emergency Department (HOSPITAL_COMMUNITY): Payer: Medicare Other

## 2019-08-26 ENCOUNTER — Encounter (HOSPITAL_COMMUNITY)
Admission: EM | Disposition: A | Discharge status: Home / Self Care | Payer: Self-pay | Source: Home / Self Care | Attending: Internal Medicine

## 2019-08-26 DIAGNOSIS — Z9089 Acquired absence of other organs: Secondary | ICD-10-CM

## 2019-08-26 DIAGNOSIS — R1310 Dysphagia, unspecified: Secondary | ICD-10-CM | POA: Diagnosis present

## 2019-08-26 DIAGNOSIS — I959 Hypotension, unspecified: Secondary | ICD-10-CM | POA: Diagnosis present

## 2019-08-26 DIAGNOSIS — K254 Chronic or unspecified gastric ulcer with hemorrhage: Principal | ICD-10-CM | POA: Diagnosis present

## 2019-08-26 DIAGNOSIS — R000 Tachycardia, unspecified: Secondary | ICD-10-CM | POA: Diagnosis present

## 2019-08-26 DIAGNOSIS — Z923 Personal history of irradiation: Secondary | ICD-10-CM | POA: Diagnosis not present

## 2019-08-26 DIAGNOSIS — Z85818 Personal history of malignant neoplasm of other sites of lip, oral cavity, and pharynx: Secondary | ICD-10-CM | POA: Diagnosis not present

## 2019-08-26 DIAGNOSIS — Z87891 Personal history of nicotine dependence: Secondary | ICD-10-CM

## 2019-08-26 DIAGNOSIS — Z20822 Contact with and (suspected) exposure to covid-19: Secondary | ICD-10-CM | POA: Diagnosis present

## 2019-08-26 DIAGNOSIS — D62 Acute posthemorrhagic anemia: Secondary | ICD-10-CM

## 2019-08-26 DIAGNOSIS — Z931 Gastrostomy status: Secondary | ICD-10-CM

## 2019-08-26 DIAGNOSIS — D500 Iron deficiency anemia secondary to blood loss (chronic): Secondary | ICD-10-CM | POA: Diagnosis not present

## 2019-08-26 DIAGNOSIS — Z79899 Other long term (current) drug therapy: Secondary | ICD-10-CM

## 2019-08-26 DIAGNOSIS — K922 Gastrointestinal hemorrhage, unspecified: Secondary | ICD-10-CM | POA: Diagnosis present

## 2019-08-26 DIAGNOSIS — R739 Hyperglycemia, unspecified: Secondary | ICD-10-CM | POA: Diagnosis not present

## 2019-08-26 HISTORY — PX: HEMOSTASIS CLIP PLACEMENT: SHX6857

## 2019-08-26 HISTORY — PX: HOT HEMOSTASIS: SHX5433

## 2019-08-26 HISTORY — PX: SCLEROTHERAPY: SHX6841

## 2019-08-26 HISTORY — PX: ESOPHAGOGASTRODUODENOSCOPY: SHX5428

## 2019-08-26 LAB — CBC WITH DIFFERENTIAL/PLATELET
Abs Immature Granulocytes: 0.02 10*3/uL (ref 0.00–0.07)
Basophils Absolute: 0 10*3/uL (ref 0.0–0.1)
Basophils Relative: 0 %
Eosinophils Absolute: 0 10*3/uL (ref 0.0–0.5)
Eosinophils Relative: 0 %
HCT: 24.5 % — ABNORMAL LOW (ref 36.0–46.0)
Hemoglobin: 7.7 g/dL — ABNORMAL LOW (ref 12.0–15.0)
Immature Granulocytes: 0 %
Lymphocytes Relative: 10 %
Lymphs Abs: 0.8 10*3/uL (ref 0.7–4.0)
MCH: 29.7 pg (ref 26.0–34.0)
MCHC: 31.4 g/dL (ref 30.0–36.0)
MCV: 94.6 fL (ref 80.0–100.0)
Monocytes Absolute: 0.5 10*3/uL (ref 0.1–1.0)
Monocytes Relative: 7 %
Neutro Abs: 6.3 10*3/uL (ref 1.7–7.7)
Neutrophils Relative %: 83 %
Platelets: 204 10*3/uL (ref 150–400)
RBC: 2.59 MIL/uL — ABNORMAL LOW (ref 3.87–5.11)
RDW: 13.2 % (ref 11.5–15.5)
WBC: 7.7 10*3/uL (ref 4.0–10.5)
nRBC: 0 % (ref 0.0–0.2)

## 2019-08-26 LAB — COMPREHENSIVE METABOLIC PANEL
ALT: 7 U/L (ref 0–44)
AST: 23 U/L (ref 15–41)
Albumin: 3.1 g/dL — ABNORMAL LOW (ref 3.5–5.0)
Alkaline Phosphatase: 46 U/L (ref 38–126)
Anion gap: 9 (ref 5–15)
BUN: 51 mg/dL — ABNORMAL HIGH (ref 8–23)
CO2: 23 mmol/L (ref 22–32)
Calcium: 8.2 mg/dL — ABNORMAL LOW (ref 8.9–10.3)
Chloride: 109 mmol/L (ref 98–111)
Creatinine, Ser: 0.6 mg/dL (ref 0.44–1.00)
GFR calc Af Amer: >60 mL/min (ref >60)
GFR calc non Af Amer: >60 mL/min (ref >60)
Glucose, Bld: 150 mg/dL — ABNORMAL HIGH (ref 70–99)
Potassium: 4.5 mmol/L (ref 3.5–5.1)
Sodium: 141 mmol/L (ref 135–145)
Total Bilirubin: 0.8 mg/dL (ref 0.3–1.2)
Total Protein: 5.1 g/dL — ABNORMAL LOW (ref 6.5–8.1)

## 2019-08-26 LAB — SARS CORONAVIRUS 2 BY RT PCR (HOSPITAL ORDER, PERFORMED IN ~~LOC~~ HOSPITAL LAB): SARS Coronavirus 2: NEGATIVE

## 2019-08-26 LAB — PROTIME-INR
INR: 1.1 (ref 0.8–1.2)
INR: 1.1 (ref 0.8–1.2)
Prothrombin Time: 13.7 seconds (ref 11.4–15.2)
Prothrombin Time: 13.6 seconds (ref 11.4–15.2)

## 2019-08-26 LAB — CBC
HCT: 33.4 % — ABNORMAL LOW (ref 36.0–46.0)
Hemoglobin: 11 g/dL — ABNORMAL LOW (ref 12.0–15.0)
MCH: 29.4 pg (ref 26.0–34.0)
MCHC: 32.9 g/dL (ref 30.0–36.0)
MCV: 89.3 fL (ref 80.0–100.0)
Platelets: 184 10*3/uL (ref 150–400)
RBC: 3.74 MIL/uL — ABNORMAL LOW (ref 3.87–5.11)
RDW: 13.2 % (ref 11.5–15.5)
WBC: 8.2 10*3/uL (ref 4.0–10.5)
nRBC: 0 % (ref 0.0–0.2)

## 2019-08-26 LAB — I-STAT CHEM 8, ED
BUN: 58 mg/dL — ABNORMAL HIGH (ref 8–23)
Calcium, Ion: 1.13 mmol/L — ABNORMAL LOW (ref 1.15–1.40)
Chloride: 107 mmol/L (ref 98–111)
Creatinine, Ser: 0.4 mg/dL — ABNORMAL LOW (ref 0.44–1.00)
Glucose, Bld: 150 mg/dL — ABNORMAL HIGH (ref 70–99)
HCT: 21 % — ABNORMAL LOW (ref 36.0–46.0)
Hemoglobin: 7.1 g/dL — ABNORMAL LOW (ref 12.0–15.0)
Potassium: 4.4 mmol/L (ref 3.5–5.1)
Sodium: 142 mmol/L (ref 135–145)
TCO2: 27 mmol/L (ref 22–32)

## 2019-08-26 LAB — ABO/RH: ABO/RH(D): B POS

## 2019-08-26 LAB — MRSA PCR SCREENING: MRSA by PCR: NEGATIVE

## 2019-08-26 LAB — MAGNESIUM: Magnesium: 1.9 mg/dL (ref 1.7–2.4)

## 2019-08-26 LAB — PREPARE RBC (CROSSMATCH)

## 2019-08-26 LAB — POC OCCULT BLOOD, ED: Fecal Occult Bld: POSITIVE — AB

## 2019-08-26 LAB — HIV ANTIBODY (ROUTINE TESTING W REFLEX): HIV Screen 4th Generation wRfx: NONREACTIVE

## 2019-08-26 LAB — APTT: aPTT: 25 seconds (ref 24–36)

## 2019-08-26 SURGERY — EGD (ESOPHAGOGASTRODUODENOSCOPY)
Anesthesia: General

## 2019-08-26 MED ORDER — ONDANSETRON HCL 4 MG/2ML IJ SOLN
4.0000 mg | Freq: Once | INTRAMUSCULAR | Status: AC
  Administered 2019-08-26: 4 mg via INTRAVENOUS
  Filled 2019-08-26: qty 2

## 2019-08-26 MED ORDER — METOCLOPRAMIDE HCL 5 MG/ML IJ SOLN: 10.0000 mg | Freq: Once | INTRAMUSCULAR | Status: DC

## 2019-08-26 MED ORDER — SODIUM CHLORIDE 0.9 % IV BOLUS
1000.0000 mL | Freq: Once | INTRAVENOUS | Status: AC
  Administered 2019-08-26: 1000 mL via INTRAVENOUS

## 2019-08-26 MED ORDER — FENTANYL CITRATE (PF) 100 MCG/2ML IJ SOLN
INTRAMUSCULAR | Status: DC | PRN
  Administered 2019-08-26: 50 ug via INTRAVENOUS

## 2019-08-26 MED ORDER — ORAL CARE MOUTH RINSE
15.0000 mL | Freq: Two times a day (BID) | OROMUCOSAL | Status: DC
  Administered 2019-08-26 – 2019-08-29 (×6): 15 mL via OROMUCOSAL

## 2019-08-26 MED ORDER — FENTANYL CITRATE (PF) 100 MCG/2ML IJ SOLN
INTRAMUSCULAR | Status: AC
  Filled 2019-08-26: qty 2

## 2019-08-26 MED ORDER — SODIUM CHLORIDE 0.9 % IV SOLN: INTRAVENOUS | Status: DC

## 2019-08-26 MED ORDER — SODIUM CHLORIDE 0.9% IV SOLUTION: Freq: Once | INTRAVENOUS | Status: DC

## 2019-08-26 MED ORDER — ONDANSETRON HCL 4 MG/2ML IJ SOLN
INTRAMUSCULAR | Status: DC | PRN
  Administered 2019-08-26: 4 mg via INTRAVENOUS

## 2019-08-26 MED ORDER — LIDOCAINE 2% (20 MG/ML) 5 ML SYRINGE
INTRAMUSCULAR | Status: DC | PRN
  Administered 2019-08-26: 80 mg via INTRAVENOUS

## 2019-08-26 MED ORDER — SODIUM CHLORIDE 0.9 % IV SOLN
500.0000 mg | Freq: Once | INTRAVENOUS | Status: AC
  Administered 2019-08-26: 500 mg via INTRAVENOUS
  Filled 2019-08-26: qty 10

## 2019-08-26 MED ORDER — SODIUM CHLORIDE 0.9 % IV SOLN
80.0000 mg | Freq: Once | INTRAVENOUS | Status: AC
  Administered 2019-08-26: 80 mg via INTRAVENOUS
  Filled 2019-08-26: qty 80

## 2019-08-26 MED ORDER — LACTATED RINGERS IV SOLN: INTRAVENOUS | Status: DC | PRN

## 2019-08-26 MED ORDER — PANTOPRAZOLE SODIUM 40 MG IV SOLR
40.0000 mg | Freq: Two times a day (BID) | INTRAVENOUS | Status: DC
  Administered 2019-08-26 – 2019-08-29 (×6): 40 mg via INTRAVENOUS
  Filled 2019-08-26 (×6): qty 40

## 2019-08-26 MED ORDER — METOCLOPRAMIDE HCL 5 MG/ML IJ SOLN
10.0000 mg | Freq: Once | INTRAMUSCULAR | Status: AC
  Administered 2019-08-26: 10 mg via INTRAVENOUS
  Filled 2019-08-26: qty 2

## 2019-08-26 MED ORDER — PROPOFOL 10 MG/ML IV BOLUS
INTRAVENOUS | Status: DC | PRN
  Administered 2019-08-26: 120 mg via INTRAVENOUS

## 2019-08-26 MED ORDER — EPHEDRINE SULFATE 50 MG/ML IJ SOLN
INTRAMUSCULAR | Status: DC | PRN
  Administered 2019-08-26: 5 mg via INTRAVENOUS

## 2019-08-26 MED ORDER — DEXAMETHASONE SODIUM PHOSPHATE 10 MG/ML IJ SOLN
INTRAMUSCULAR | Status: DC | PRN
  Administered 2019-08-26: 4 mg via INTRAVENOUS

## 2019-08-26 MED ORDER — SUCCINYLCHOLINE CHLORIDE 20 MG/ML IJ SOLN
INTRAMUSCULAR | Status: DC | PRN
  Administered 2019-08-26: 120 mg via INTRAVENOUS

## 2019-08-26 MED ORDER — SODIUM CHLORIDE (PF) 0.9 % IJ SOLN
PREFILLED_SYRINGE | INTRAMUSCULAR | Status: DC | PRN
  Administered 2019-08-26: 4 mL

## 2019-08-26 MED ORDER — SODIUM CHLORIDE 0.9 % IV SOLN: INTRAVENOUS | Status: DC | PRN

## 2019-08-26 MED ORDER — PHENYLEPHRINE HCL-NACL 10-0.9 MG/250ML-% IV SOLN
INTRAVENOUS | Status: DC | PRN
  Administered 2019-08-26: 50 ug/min via INTRAVENOUS

## 2019-08-26 MED ORDER — PHENYLEPHRINE HCL (PRESSORS) 10 MG/ML IV SOLN
INTRAVENOUS | Status: DC | PRN
  Administered 2019-08-26 (×2): 160 ug via INTRAVENOUS
  Administered 2019-08-26 (×2): 120 ug via INTRAVENOUS

## 2019-08-26 MED ORDER — SODIUM CHLORIDE 0.9 % IV SOLN
8.0000 mg/h | INTRAVENOUS | Status: DC
  Administered 2019-08-26: 8 mg/h via INTRAVENOUS
  Filled 2019-08-26: qty 80

## 2019-08-26 NOTE — Anesthesia Procedure Notes (Addendum)
Procedure Name: Intubation Date/Time: 08/26/2019 3:22 PM Performed by: Inda Coke, CRNA Pre-anesthesia Checklist: Patient identified, Emergency Drugs available, Suction available and Patient being monitored Patient Re-evaluated:Patient Re-evaluated prior to induction Oxygen Delivery Method: Circle System Utilized Preoxygenation: Pre-oxygenation with 100% oxygen Induction Type: IV induction, Rapid sequence and Cricoid Pressure applied Ventilation: Mask ventilation without difficulty Laryngoscope Size: Glidescope and 3 Grade View: Grade II Tube type: Oral Tube size: 6.5 mm Number of attempts: 1 Airway Equipment and Method: Stylet and Oral airway Placement Confirmation: ETT inserted through vocal cords under direct vision,  positive ETCO2 and breath sounds checked- equal and bilateral Secured at: 18 cm Tube secured with: Tape Dental Injury: Teeth and Oropharynx as per pre-operative assessment  Difficulty Due To: Difficulty was anticipated Future Recommendations: Recommend- induction with short-acting agent, and alternative techniques readily available Comments: Elective glidescope intubation

## 2019-08-26 NOTE — Consult Note (Addendum)
Referring Provider: Dr. Madalyn Rob Primary Care Physician:  Maurice Small, MD Primary Gastroenterologist:  Althia Forts  Reason for Consultation:  Upper GI Bleeding  HPI: Theresa Stephens is a 69 y.o. 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  History obtained by patient and patient's daughters Theresa Stephens (present at bedside) and Theresa Stephens (on the phone/Facetime).  Patient is alert and oriented but verbal communication is limited due to prior mandibulectomy and glossectomy.  Patient was in her usual state of health until this morning, when she started experiencing nausea followed by several episodes of hematemesis.  She describes the emesis as bright red blood.  She also had one episode of melena today.  Denies any hematochezia.  She denies any previous history of GI bleeding, hematemesis, and melena.  She is unable to quantify the total amount of emesis, stating it was "a lot."  She also noted blood coming out of her G-tube.  Patient has felt a little bit short of breath since arrival but denies any chest pain.  No known history of cardiac or pulmonary disorders.  Denies any abdominal pain, dysphagia, unexplained weight loss, and recent changes in stool until she experienced melena today.  She denies any sensation of heartburn, though daughter notes that acid/bile has been "spurting" out of her G tube recently.  Last changed tube 3 days ago.    Patient denies any consistent use of NSAIDs.  She has previously taken NSAIDs for short period of time postoperatively.  She denies any aspirin or blood thinner use.  Patient denies any family history of gastrointestinal malignancies or bleeding disorders, though she does believe that her mother had gastric ulcers with upper GI bleeding.  She has never had an EGD or colonoscopy.   Past  Medical History:  Diagnosis Date  . Mouth pain 03/12/2013  . 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 TONGUE BIOPSY    . TONSILLECTOMY      Prior to Admission medications   Medication Sig Start Date End Date Taking? Authorizing Provider  acetaminophen (TYLENOL) 500 MG tablet Take 500 mg by mouth every 6 (six) hours as needed for moderate pain.   Yes [provider]  loratadine (CLARITIN) 5 MG/5ML syrup Take 10 mg by mouth daily as needed for allergies or rhinitis.   Yes [provider]  Nutritional Supplements (FEEDING SUPPLEMENT, OSMOLITE 1.5 CAL,) LIQD Place 237 mLs into feeding tube 5 (five) times daily.   Yes [provider]  HYDROcodone-acetaminophen (HYCET) 7.5-325 mg/15 ml solution Take 10 mLs by mouth every 4 (four) hours as needed for severe pain. Patient not taking: Reported on 12/22/2015 03/16/15   Sherwood Gambler, MD    Scheduled Meds: . sodium chloride   Intravenous Once   Continuous Infusions: . sodium chloride    . pantoprozole (PROTONIX) infusion Stopped (08/26/19 1258)   PRN Meds:.  Allergies as of 08/26/2019  . (No Known Allergies)    Family History  Problem Relation Age of Onset  . Cancer Maternal Uncle        lung ca  . Cancer Daughter 17       breast ca    Social History   Socioeconomic History  . Marital  status: Legally Separated    Spouse name: Not on file  . Number of children: Not on file  . Years of education: Not on file  . Highest education level: Not on file  Occupational History  . Not on file  Tobacco Use  . Smoking status: Former Smoker    Packs/day: 1.00    Years: 45.00    Pack years: 45.00    Quit date: 03/04/2013    Years since quitting: 6.4  . Smokeless tobacco: Never Used  . Tobacco comment: stopped smoking 02/2013   Substance and Sexual Activity  . Alcohol use: No  . Drug use: No    Frequency: 2.0 times per week  . Sexual activity: Not on file  Other Topics Concern  . Not on file  Social History Narrative  . Not on file   Social Determinants of Health   Financial Resource Strain:   . Difficulty of Paying Living Expenses:   Food Insecurity:   . Worried About Charity fundraiser in the Last Year:   . Arboriculturist in the Last Year:   Transportation Needs:   . Film/video editor (Medical):   Marland Kitchen Lack of Transportation (Non-Medical):   Physical Activity:   . Days of Exercise per Week:   . Minutes of Exercise per Session:   Stress:   . Feeling of Stress :   Social Connections:   . Frequency of Communication with Friends and Family:   . Frequency of Social Gatherings with Friends and Family:   . Attends Religious Services:   . Active Member of Clubs or Organizations:   . Attends Archivist Meetings:   Marland Kitchen Marital Status:   Intimate Partner Violence:   . Fear of Current or Ex-Partner:   . Emotionally Abused:   Marland Kitchen Physically Abused:   . Sexually Abused:     Review of Systems: Review of Systems  Constitutional: Negative for chills, fever, malaise/fatigue and weight loss.  HENT: Negative for hearing loss and tinnitus.   Eyes: Negative for pain and redness.  Respiratory: Positive for shortness of breath. Negative for cough.   Cardiovascular: Negative for chest pain and palpitations.  Gastrointestinal: Positive for melena, nausea and vomiting. Negative for abdominal pain, blood in stool, constipation, diarrhea and heartburn.  Genitourinary: Negative for flank pain and hematuria.  Musculoskeletal: Negative for falls and joint pain.  Skin: Negative for itching and rash.  Neurological: Negative for seizures and loss of consciousness.  Endo/Heme/Allergies: Negative for polydipsia. Does not bruise/bleed easily.  Psychiatric/Behavioral: Negative for substance abuse. The patient is  not nervous/anxious.     Physical Exam: Vital signs: Vitals:   08/26/19 1301 08/26/19 1306  BP: 130/68 (!) 123/52  Pulse: (!) 112 (!) 101  Resp: (!) 27 20  Temp:    SpO2: 100% 100%     Physical Exam Vitals and nursing note reviewed.  Constitutional:      General: She is not in acute distress.    Appearance: Normal appearance. She is not ill-appearing.  HENT:     Head: Normocephalic and atraumatic.     Comments: Chronic mandibular swelling and scarring s/p mandibulectomy    Nose: Nose normal. No congestion or rhinorrhea.     Mouth/Throat:     Mouth: Mucous membranes are moist.     Comments: S/p glossectomy Eyes:     General: No scleral icterus.    Extraocular Movements: Extraocular movements intact.     Comments: Conjunctival pallor  Cardiovascular:  Rate and Rhythm: Regular rhythm. Tachycardia present.     Pulses: Normal pulses.     Heart sounds: Normal heart sounds.  Pulmonary:     Effort: No respiratory distress.     Breath sounds: Normal breath sounds.     Comments: Mild tachypnea Abdominal:     General: Bowel sounds are normal. There is no distension.     Palpations: Abdomen is soft. There is no mass.     Tenderness: There is abdominal tenderness (mild, epigastric). There is no guarding or rebound.     Comments: G tube in place with red blood leaking and present on surrounding skin  Musculoskeletal:        General: No swelling or tenderness.     Cervical back: Normal range of motion and neck supple.     Right lower leg: No edema.     Left lower leg: No edema.  Skin:    General: Skin is warm and dry.     Coloration: Skin is pale.  Neurological:     General: No focal deficit present.     Mental Status: She is alert and oriented to person, place, and time.  Psychiatric:        Mood and Affect: Mood normal.        Behavior: Behavior normal.     GI:  Lab Results: Recent Labs    08/26/19 1129 08/26/19 1146  WBC 7.7  --   HGB 7.7* 7.1*  HCT 24.5*  21.0*  PLT 204  --    BMET Recent Labs    08/26/19 1129 08/26/19 1146  NA 141 142  K 4.5 4.4  CL 109 107  CO2 23  --   GLUCOSE 150* 150*  BUN 51* 58*  CREATININE 0.60 0.40*  CALCIUM 8.2*  --    LFT Recent Labs    08/26/19 1129  PROT 5.1*  ALBUMIN 3.1*  AST 23  ALT 7  ALKPHOS 46  BILITOT 0.8   PT/INR Recent Labs    08/26/19 1129  LABPROT 13.7  INR 1.1     Studies/Results: DG Chest Portable 1 View  Result Date: 08/26/2019 CLINICAL DATA:  GI bleed, difficulty breathing EXAM: PORTABLE CHEST 1 VIEW COMPARISON:  12/31/2013 FINDINGS: The heart size and mediastinal contours are within normal limits. No focal airspace consolidation, pleural effusion, or pneumothorax. The visualized skeletal structures are unremarkable. Surgical clips within the bilateral axillary regions. IMPRESSION: No active disease. Electronically Signed   By: Davina Poke D.O.   On: 08/26/2019 12:06    Impression Upper GI bleeding: multiple episodes of hematemesis, blood coming from G tube, and melena -Hgb 7.7 today, 1u pRBCs currently infusing.  No baseline on file, though in 03/2015, Hgb was 11.2 -BUN 51/Cr 0.60.  Elevated  BUN:Cr ratio suggestive of upper GI bleeding. -Patient hypotensive and tachycardic upon arrival.  Blood pressure is improving with IVF and pRBCs.  She remains mildly tachycardic. -INR 1.1  SCC of alveolar ridge s/p mandibulectomy with plating and glossectomy -Has been a difficult intubation in the past requiring fiberoptics  Plan: EGD today.  I thoroughly discussed the procedure, nature, benefits, and risks (including but not limited to bleeding, perforation, infection, anesthesia/cardiac and pulmonary complications, difficult intubation history) with the patient and her two daughters.  Patient and both of patient's daughters Theresa Stephens, present at bedside, and Theresa Stephens, via phone/FaceTime) verbalized understanding and gave verbal consent to proceed with the EGD today with Dr.  Therisa Doyne.  Continue to monitor H&H with transfusion  as needed to maintain hemoglobin greater than 7.  Continue Protonix infusion.  Addendum: 10 mg Reglan and 1000 mg erythromycin IV ordered to be administered STAT to aid in clearance of blood from the stomach prior to EGD.  [facility] will follow.   LOS: 0 days   Salley Slaughter  PA-C 08/26/2019, 1:16 PM  Contact #  [phone]

## 2019-08-26 NOTE — Anesthesia Postprocedure Evaluation (Signed)
Anesthesia Post Note  Patient: Theresa Stephens  Procedure(s) Performed: ESOPHAGOGASTRODUODENOSCOPY (EGD) (N/A ) HOT HEMOSTASIS (ARGON PLASMA COAGULATION/BICAP) (N/A ) [place]     Patient location during evaluation: PACU Anesthesia Type: General Level of consciousness: sedated Pain management: pain level controlled Vital Signs Assessment: post-procedure vital signs reviewed and stable Respiratory status: spontaneous breathing and respiratory function stable Cardiovascular status: stable Postop Assessment: no apparent nausea or vomiting Anesthetic complications: no   No complications documented.  Last Vitals:  Vitals:   08/26/19 1621 08/26/19 1631  BP: (!) 166/83 (!) 171/84  Pulse: (!) 106 (!) 103  Resp: (!) 22 (!) 24  Temp: 36.6 C   SpO2: 99% 100%    Last Pain:  Vitals:   08/26/19 1631  TempSrc:   PainSc: 0-No pain                 Aum Caggiano DANIEL

## 2019-08-26 NOTE — H&P (View-Only) (Signed)
Referring Provider: Dr. Madalyn Rob Primary Care Physician:  Maurice Small, MD Primary Gastroenterologist:  Althia Forts  Reason for Consultation:  Upper GI Bleeding  HPI: Theresa Stephens is a 69 y.o. 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  History obtained by patient and patient's daughters Theresa Stephens (present at bedside) and Theresa Stephens (on the phone/Facetime).  Patient is alert and oriented but verbal communication is limited due to prior mandibulectomy and glossectomy.  Patient was in her usual state of health until this morning, when she started experiencing nausea followed by several episodes of hematemesis.  She describes the emesis as bright red blood.  She also had one episode of melena today.  Denies any hematochezia.  She denies any previous history of GI bleeding, hematemesis, and melena.  She is unable to quantify the total amount of emesis, stating it was "a lot."  She also noted blood coming out of her G-tube.  Patient has felt a little bit short of breath since arrival but denies any chest pain.  No known history of cardiac or pulmonary disorders.  Denies any abdominal pain, dysphagia, unexplained weight loss, and recent changes in stool until she experienced melena today.  She denies any sensation of heartburn, though daughter notes that acid/bile has been "spurting" out of her G tube recently.  Last changed tube 3 days ago.    Patient denies any consistent use of NSAIDs.  She has previously taken NSAIDs for short period of time postoperatively.  She denies any aspirin or blood thinner use.  Patient denies any family history of gastrointestinal malignancies or bleeding disorders, though she does believe that her mother had gastric ulcers with upper GI bleeding.  She has never had an EGD or colonoscopy.   Past  Medical History:  Diagnosis Date  . Mouth pain 03/12/2013  . 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 TONGUE BIOPSY    . TONSILLECTOMY      Prior to Admission medications   Medication Sig Start Date End Date Taking? Authorizing Provider  acetaminophen (TYLENOL) 500 MG tablet Take 500 mg by mouth every 6 (six) hours as needed for moderate pain.   Yes [provider]  loratadine (CLARITIN) 5 MG/5ML syrup Take 10 mg by mouth daily as needed for allergies or rhinitis.   Yes [provider]  Nutritional Supplements (FEEDING SUPPLEMENT, OSMOLITE 1.5 CAL,) LIQD Place 237 mLs into feeding tube 5 (five) times daily.   Yes [provider]  HYDROcodone-acetaminophen (HYCET) 7.5-325 mg/15 ml solution Take 10 mLs by mouth every 4 (four) hours as needed for severe pain. Patient not taking: Reported on 12/22/2015 03/16/15   Sherwood Gambler, MD    Scheduled Meds: . sodium chloride   Intravenous Once   Continuous Infusions: . sodium chloride    . pantoprozole (PROTONIX) infusion Stopped (08/26/19 1258)   PRN Meds:.  Allergies as of 08/26/2019  . (No Known Allergies)    Family History  Problem Relation Age of Onset  . Cancer Maternal Uncle        lung ca  . Cancer Daughter 16       breast ca    Social History   Socioeconomic History  . Marital  status: Legally Separated    Spouse name: Not on file  . Number of children: Not on file  . Years of education: Not on file  . Highest education level: Not on file  Occupational History  . Not on file  Tobacco Use  . Smoking status: Former Smoker    Packs/day: 1.00    Years: 45.00    Pack years: 45.00    Quit date: 03/04/2013    Years since quitting: 6.4  . Smokeless tobacco: Never Used  . Tobacco comment: stopped smoking 02/2013   Substance and Sexual Activity  . Alcohol use: No  . Drug use: No    Frequency: 2.0 times per week  . Sexual activity: Not on file  Other Topics Concern  . Not on file  Social History Narrative  . Not on file   Social Determinants of Health   Financial Resource Strain:   . Difficulty of Paying Living Expenses:   Food Insecurity:   . Worried About Charity fundraiser in the Last Year:   . Arboriculturist in the Last Year:   Transportation Needs:   . Film/video editor (Medical):   Marland Kitchen Lack of Transportation (Non-Medical):   Physical Activity:   . Days of Exercise per Week:   . Minutes of Exercise per Session:   Stress:   . Feeling of Stress :   Social Connections:   . Frequency of Communication with Friends and Family:   . Frequency of Social Gatherings with Friends and Family:   . Attends Religious Services:   . Active Member of Clubs or Organizations:   . Attends Archivist Meetings:   Marland Kitchen Marital Status:   Intimate Partner Violence:   . Fear of Current or Ex-Partner:   . Emotionally Abused:   Marland Kitchen Physically Abused:   . Sexually Abused:     Review of Systems: Review of Systems  Constitutional: Negative for chills, fever, malaise/fatigue and weight loss.  HENT: Negative for hearing loss and tinnitus.   Eyes: Negative for pain and redness.  Respiratory: Positive for shortness of breath. Negative for cough.   Cardiovascular: Negative for chest pain and palpitations.  Gastrointestinal: Positive for melena, nausea and vomiting. Negative for abdominal pain, blood in stool, constipation, diarrhea and heartburn.  Genitourinary: Negative for flank pain and hematuria.  Musculoskeletal: Negative for falls and joint pain.  Skin: Negative for itching and rash.  Neurological: Negative for seizures and loss of consciousness.  Endo/Heme/Allergies: Negative for polydipsia. Does not bruise/bleed easily.  Psychiatric/Behavioral: Negative for substance abuse. The patient is  not nervous/anxious.     Physical Exam: Vital signs: Vitals:   08/26/19 1301 08/26/19 1306  BP: 130/68 (!) 123/52  Pulse: (!) 112 (!) 101  Resp: (!) 27 20  Temp:    SpO2: 100% 100%     Physical Exam Vitals and nursing note reviewed.  Constitutional:      General: She is not in acute distress.    Appearance: Normal appearance. She is not ill-appearing.  HENT:     Head: Normocephalic and atraumatic.     Comments: Chronic mandibular swelling and scarring s/p mandibulectomy    Nose: Nose normal. No congestion or rhinorrhea.     Mouth/Throat:     Mouth: Mucous membranes are moist.     Comments: S/p glossectomy Eyes:     General: No scleral icterus.    Extraocular Movements: Extraocular movements intact.     Comments: Conjunctival pallor  Cardiovascular:  Rate and Rhythm: Regular rhythm. Tachycardia present.     Pulses: Normal pulses.     Heart sounds: Normal heart sounds.  Pulmonary:     Effort: No respiratory distress.     Breath sounds: Normal breath sounds.     Comments: Mild tachypnea Abdominal:     General: Bowel sounds are normal. There is no distension.     Palpations: Abdomen is soft. There is no mass.     Tenderness: There is abdominal tenderness (mild, epigastric). There is no guarding or rebound.     Comments: G tube in place with red blood leaking and present on surrounding skin  Musculoskeletal:        General: No swelling or tenderness.     Cervical back: Normal range of motion and neck supple.     Right lower leg: No edema.     Left lower leg: No edema.  Skin:    General: Skin is warm and dry.     Coloration: Skin is pale.  Neurological:     General: No focal deficit present.     Mental Status: She is alert and oriented to person, place, and time.  Psychiatric:        Mood and Affect: Mood normal.        Behavior: Behavior normal.     GI:  Lab Results: Recent Labs    08/26/19 1129 08/26/19 1146  WBC 7.7  --   HGB 7.7* 7.1*  HCT 24.5*  21.0*  PLT 204  --    BMET Recent Labs    08/26/19 1129 08/26/19 1146  NA 141 142  K 4.5 4.4  CL 109 107  CO2 23  --   GLUCOSE 150* 150*  BUN 51* 58*  CREATININE 0.60 0.40*  CALCIUM 8.2*  --    LFT Recent Labs    08/26/19 1129  PROT 5.1*  ALBUMIN 3.1*  AST 23  ALT 7  ALKPHOS 46  BILITOT 0.8   PT/INR Recent Labs    08/26/19 1129  LABPROT 13.7  INR 1.1     Studies/Results: DG Chest Portable 1 View  Result Date: 08/26/2019 CLINICAL DATA:  GI bleed, difficulty breathing EXAM: PORTABLE CHEST 1 VIEW COMPARISON:  12/31/2013 FINDINGS: The heart size and mediastinal contours are within normal limits. No focal airspace consolidation, pleural effusion, or pneumothorax. The visualized skeletal structures are unremarkable. Surgical clips within the bilateral axillary regions. IMPRESSION: No active disease. Electronically Signed   By: Davina Poke D.O.   On: 08/26/2019 12:06    Impression Upper GI bleeding: multiple episodes of hematemesis, blood coming from G tube, and melena -Hgb 7.7 today, 1u pRBCs currently infusing.  No baseline on file, though in 03/2015, Hgb was 11.2 -BUN 51/Cr 0.60.  Elevated  BUN:Cr ratio suggestive of upper GI bleeding. -Patient hypotensive and tachycardic upon arrival.  Blood pressure is improving with IVF and pRBCs.  She remains mildly tachycardic. -INR 1.1  SCC of alveolar ridge s/p mandibulectomy with plating and glossectomy -Has been a difficult intubation in the past requiring fiberoptics  Plan: EGD today.  I thoroughly discussed the procedure, nature, benefits, and risks (including but not limited to bleeding, perforation, infection, anesthesia/cardiac and pulmonary complications, difficult intubation history) with the patient and her two daughters.  Patient and both of patient's daughters Bashor, present at bedside, and Magdalene River, via phone/FaceTime) verbalized understanding and gave verbal consent to proceed with the EGD today with Dr.  Therisa Doyne.  Continue to monitor H&H with transfusion  as needed to maintain hemoglobin greater than 7.  Continue Protonix infusion.  Addendum: 10 mg Reglan and 1000 mg erythromycin IV ordered to be administered STAT to aid in clearance of blood from the stomach prior to EGD.  [facility] will follow.   LOS: 0 days   Salley Slaughter  PA-C 08/26/2019, 1:16 PM  Contact #  [phone]

## 2019-08-26 NOTE — Anesthesia Preprocedure Evaluation (Addendum)
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 S/P radiation therapy, mandibulectomy with plating and glossectomy (also s/p radiation) with G-tube (placed laparoscopically) presenting with upper GI bleeding  Reviewed: Allergy & Precautions, NPO status , Patient's Chart, lab work & pertinent test results  Airway Mallampati: IV   Neck ROM: Limited  Mouth opening: Limited Mouth Opening  Dental  (+) Edentulous Upper, Edentulous Lower   Pulmonary former smoker,    Pulmonary exam normal        Cardiovascular negative cardio ROS   Rhythm:Regular Rate:Tachycardia     Neuro/Psych negative neurological ROS     GI/Hepatic Neg liver ROS, UGIB   Endo/Other  negative endocrine ROS  Renal/GU negative Renal ROS     Musculoskeletal negative musculoskeletal ROS (+)   Abdominal   Peds  Hematology  (+) Blood dyscrasia, anemia ,   Anesthesia Other Findings Day of surgery medications reviewed with the patient.  Reproductive/Obstetrics                            Anesthesia Physical Anesthesia Plan  ASA: III and emergent  Anesthesia Plan: General   Post-op Pain Management:    Induction: Intravenous  PONV Risk Score and Plan: 3 and Ondansetron and Dexamethasone  Airway Management Planned: Oral ETT  Additional Equipment:   Intra-op Plan:   Post-operative Plan: Post-operative intubation/ventilation  Informed Consent: I have reviewed the patients History and Physical, chart, labs and discussed the procedure including the risks, benefits and alternatives for the proposed anesthesia with the patient or authorized representative who has indicated his/her understanding and acceptance.     Dental advisory given  Plan Discussed with: CRNA and Anesthesiologist  Anesthesia Plan  Comments:         Anesthesia Quick Evaluation

## 2019-08-26 NOTE — ED Notes (Signed)
Pt's BP decreased to 86/50, Dr. Roslynn Amble informed. Normal saline bolus infusing.

## 2019-08-26 NOTE — ED Provider Notes (Signed)
[facility] EMERGENCY DEPARTMENT Provider Note    CSN: [id number] Arrival date & time: 08/26/19  1118     History Chief Complaint  Patient presents with  . GI Bleeding    Theresa Stephens is a 69 y.o. female.  Presents to ER with GI bleed.  Notable medical history for head and neck cancer, 2017 extensive reconstructive surgery.  Still G-tube dependent.  Woke up this morning vomiting blood, noted blood in stools.  Dark tarry stools.  Also noted small amount of blood around her G-tube site.  Does not have any abdominal pain, does have some nausea.  Denies fever.  Denies prior history of GI bleed.  Not on any anticoagulation.  EMS reported transiently hypotensive, last BP stable.   HPI     Past Medical History:  Diagnosis Date  . Mouth pain 03/12/2013  . 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 UGIB (upper gastrointestinal bleed) 08/26/2019  . 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 Mouth pain 03/12/2013    Past Surgical History:  Procedure Laterality Date  . TONGUE BIOPSY    . TONSILLECTOMY       OB History   No obstetric history on file.     Family History  Problem Relation Age of Onset  . Cancer Maternal Uncle        lung ca  . Cancer Daughter 3       breast ca    Social History   Tobacco Use  . Smoking status: Former Smoker    Packs/day: 1.00    Years: 45.00    Pack years: 45.00    Quit date: 03/04/2013    Years since quitting: 6.4  . Smokeless tobacco: Never Used  . Tobacco comment: stopped smoking 02/2013  Substance Use Topics  . Alcohol use: No  . Drug use: No    Frequency: 2.0 times per week    Home Medications Prior to Admission medications   Medication Sig Start Date End Date Taking? Authorizing Provider    acetaminophen (TYLENOL) 500 MG tablet Take 500 mg by mouth every 6 (six) hours as needed for moderate pain.   Yes [provider]  loratadine (CLARITIN) 5 MG/5ML syrup Take 10 mg by mouth daily as needed for allergies or rhinitis.   Yes [provider]  Nutritional Supplements (FEEDING SUPPLEMENT, OSMOLITE 1.5 CAL,) LIQD Place 237 mLs into feeding tube 5 (five) times daily.   Yes [provider]  HYDROcodone-acetaminophen (HYCET) 7.5-325 mg/15 ml solution Take 10 mLs by mouth every 4 (four) hours as needed for severe pain. Patient not taking: Reported on 12/22/2015 03/16/15   Sherwood Gambler, MD    Allergies    Patient has no known allergies.  Review of Systems   Review of Systems  Physical Exam Updated Vital Signs BP 111/61   Pulse 95   Temp 97.6 F (36.4 C) (Axillary)   Resp (!) 25   SpO2 98%   Physical Exam Constitutional:      Comments: Pale  HENT:     Head: Atraumatic.  Eyes:     Pupils: Pupils are equal, round, and reactive to light.     Comments: Pale conjunctiva  Cardiovascular:     Rate and Rhythm: Normal rate.     Pulses:  Normal pulses.  Pulmonary:     Effort: Pulmonary effort is normal. No respiratory distress.  Abdominal:     General: Abdomen is flat. There is no distension.     Tenderness: There is no abdominal tenderness.     Comments: G-tube site intact, small blood around  Genitourinary:    Rectum: Normal. Guaiac result positive.     Comments: Black stool Musculoskeletal:        General: No deformity or signs of injury.     Cervical back: Normal range of motion and neck supple.  Skin:    Coloration: Skin is pale.     Findings: No erythema or rash.  Neurological:     General: No focal deficit present.     Mental Status: She is alert and oriented to person, place, and time.  Psychiatric:        Behavior: Behavior normal.        Thought Content: Thought content normal.     ED Results / Procedures / Treatments    Labs (all labs ordered are listed, but only abnormal results are displayed) Labs Reviewed  COMPREHENSIVE METABOLIC PANEL - Abnormal; Notable for the following components:      Result Value   Glucose, Bld 150 (*)    BUN 51 (*)    Calcium 8.2 (*)    Total Protein 5.1 (*)    Albumin 3.1 (*)    All other components within normal limits  CBC WITH DIFFERENTIAL/PLATELET - Abnormal; Notable for the following components:   RBC 2.59 (*)    Hemoglobin 7.7 (*)    HCT 24.5 (*)    All other components within normal limits  I-STAT CHEM 8, ED - Abnormal; Notable for the following components:   BUN 58 (*)    Creatinine, Ser 0.40 (*)    Glucose, Bld 150 (*)    Calcium, Ion 1.13 (*)    Hemoglobin 7.1 (*)    HCT 21.0 (*)    All other components within normal limits  POC OCCULT BLOOD, ED - Abnormal; Notable for the following components:   Fecal Occult Bld POSITIVE (*)    All other components within normal limits  SARS CORONAVIRUS 2 BY RT PCR (HOSPITAL ORDER, [place] [facility])  PROTIME-INR  MAGNESIUM  HIV ANTIBODY (ROUTINE TESTING W REFLEX)  CBC  CBC  CBC  APTT  PROTIME-INR  TYPE AND SCREEN  PREPARE RBC (CROSSMATCH)  ABO/RH  PREPARE RBC (CROSSMATCH)    EKG None  Radiology DG Chest Portable 1 View  Result Date: 08/26/2019 CLINICAL DATA:  GI bleed, difficulty breathing EXAM: PORTABLE CHEST 1 VIEW COMPARISON:  12/31/2013 FINDINGS: The heart size and mediastinal contours are within normal limits. No focal airspace consolidation, pleural effusion, or pneumothorax. The visualized skeletal structures are unremarkable. Surgical clips within the bilateral axillary regions. IMPRESSION: No active disease. Electronically Signed   By: Davina Poke D.O.   On: 08/26/2019 12:06    Procedures .Critical Care Performed by: Lucrezia Starch, MD Authorized by: Lucrezia Starch, MD   Critical care provider statement:    Critical care time (minutes):  45   Critical care  was necessary to treat or prevent imminent or life-threatening deterioration of the following conditions: GI bleed.   Critical care was time spent personally by me on the following activities:  Discussions with consultants, evaluation of patient's response to treatment, examination of patient, ordering and performing treatments and interventions, ordering and review of laboratory studies, ordering and  review of radiographic studies, pulse oximetry, re-evaluation of patient's condition, obtaining history from patient or surrogate and review of old charts   (including critical care time)  Medications Ordered in ED Medications  sodium chloride 0.9 % bolus 1,000 mL (0 mLs Intravenous Stopped 08/26/19 1258)    And  0.9 %  sodium chloride infusion (has no administration in time range)  pantoprazole (PROTONIX) 80 mg in sodium chloride 0.9 % 100 mL (0.8 mg/mL) infusion (8 mg/hr Intravenous Restarted 08/26/19 1320)  erythromycin 500 mg in sodium chloride 0.9 % 100 mL IVPB (has no administration in time range)  metoCLOPramide (REGLAN) injection 10 mg (has no administration in time range)  0.9 %  sodium chloride infusion (Manually program via Guardrails IV Fluids) (has no administration in time range)  pantoprazole (PROTONIX) 80 mg in sodium chloride 0.9 % 100 mL IVPB (0 mg Intravenous Stopped 08/26/19 1237)  ondansetron (ZOFRAN) injection 4 mg (4 mg Intravenous Given 08/26/19 1148)    ED Course  I have reviewed the triage vital signs and the nursing notes.  Pertinent labs & imaging results that were available during my care of the patient were reviewed by me and considered in my medical decision making (see chart for details).  Clinical Course as of Aug 25 1433  Thu Aug 26, 2019  1300 D/w critical care, they will come to bedsi   [RD]    Clinical Course User Index [RD] Lucrezia Starch, MD   MDM Rules/Calculators/A&P                          69 year old lady presents to ER with concern for black  stools, vomiting bright red blood.  On arrival here, patient appeared pale, initial BP stable.  Concern for upper GI bleed, ordered Protonix bolus with Protonix drip.  Anemic to 7.5, will transfuse 1 unit for now.  Transiently hypotensive, responded to fluids and blood transfusion.  Consulted GI and critical care.  GI will take to endoscopy this afternoon, critical care will assume care.  Dr. Lynetta Mare accepting attending to ICU.   Final Clinical Impression(s) / ED Diagnoses Final diagnoses:  Acute GI bleeding  Upper GI bleed  Acute blood loss anemia    Rx / DC Orders ED Discharge Orders    None       Lucrezia Starch, MD 08/26/19 1435

## 2019-08-26 NOTE — Op Note (Signed)
[facility] Patient Name: Theresa Stephens Procedure Date : 08/26/2019 MRN: [id number] Attending MD: Ronnette Juniper , MD Date of Birth: [birth year] CSN: [id number] Age: 69 Admit Type: Inpatient Procedure:                Upper GI endoscopy Indications:              Hematemesis, Melena, Blood coming out from G tube Providers:                Ronnette Juniper, MD, Vista Lawman, RN, Theodora Blow,                            Technician Referring MD:             ER,Critical Care Team Medicines:                Monitored Anesthesia Care Complications:            No immediate complications. Estimated Blood Loss:     Estimated blood loss: none. Procedure:                Pre-Anesthesia Assessment:                           - Prior to the procedure, a History and Physical                            was performed, and patient medications and                            allergies were reviewed. The patient's tolerance of                            previous anesthesia was also reviewed. The risks                            and benefits of the procedure and the sedation                            options and risks were discussed with the patient.                            All questions were answered, and informed consent                            was obtained. Prior Anticoagulants: The patient has                            taken no previous anticoagulant or antiplatelet                            agents. ASA Grade Assessment: III - A patient with                            severe systemic disease. After reviewing the risks  and benefits, the patient was deemed in                            satisfactory condition to undergo the procedure.                           After obtaining informed consent, the endoscope was                            passed under direct vision. Throughout the                            procedure, the patient's blood pressure, pulse, and                             oxygen saturations were monitored continuously. The                            GIF-H190 ([id number]) Olympus gastroscope was                            introduced through the mouth, and advanced to the                            second part of duodenum. The upper GI endoscopy was                            accomplished without difficulty. The patient                            tolerated the procedure well. The patient was                            intubated for the procedure. Scope In: Scope Out: Findings:      Red blood was found in the entire esophagus. On lavage, there was no       evidence of bleeding from the esophagus.      Clotted blood was found in the gastric fundus and in the gastric body.       It took a prolonged time to remove all the clots from the fundus and       body, using a biovac and rescue net.      The underlying mucosa appeared unremarkable after removal of all the       large clots and lavage.      There was evidence of an intact gastrostomy with a patent G-tube present       in the gastric body. This was characterized by healthy appearing       underlying mucosa.      One non-bleeding cratered gastric ulcer with a visible vessel was found       in the gastric body. It was adjacent to tube gastrostomy tube site. The       lesion was 10 mm in largest dimension. Area was successfully injected       with 4 mL of a 1:10,000 solution of epinephrine for drug delivery.  Coagulation for hemostasis using bipolar probe was successful. For       hemostasis, two hemostatic clips were successfully placed (MR       conditional). There was no bleeding at the end of the procedure.      The examined duodenum was normal. There was fresh blood noted in the       duodenum, without evidence of bleeding on lavage. Impression:               - Red blood in the esophagus.                           - Clotted blood in the gastric fundus and in the                             gastric body.                           - Intact gastrostomy with a patent G-tube present                            characterized by healthy appearing mucosa.                           - Non-bleeding gastric ulcer with a visible vessel.                            Injected. Treated with bipolar cautery. Clips (MR                            conditional) were placed.                           - Normal examined duodenum.                           - No specimens collected. Moderate Sedation:      Patient did not receive moderate sedation for this procedure, but       instead received monitored anesthesia care. Recommendation:           - NPO. Plan to resume tube feeding in am if there                            is no further evidence of GI bleeding.                           - Use Protonix (pantoprazole) 40 mg IV BID.                           - H pylori serology. Procedure Code(s):        --- Professional ---                           [id number], Esophagogastroduodenoscopy, flexible,                            transoral; with control  of bleeding, any method                           43236, 59, Esophagogastroduodenoscopy, flexible,                            transoral; with directed submucosal injection(s),                            any substance Diagnosis Code(s):        --- Professional ---                           K22.8, Other specified diseases of esophagus                           K92.2, Gastrointestinal hemorrhage, unspecified                           Z93.1, Gastrostomy status                           K25.4, Chronic or unspecified gastric ulcer with                            hemorrhage                           K92.0, Hematemesis                           K92.1, Melena (includes Hematochezia) CPT copyright 2019 American Medical Association. All rights reserved. The codes documented in this report are preliminary and upon coder review may  be revised to meet current compliance  requirements. Ronnette Juniper, MD 08/26/2019 4:21:09 PM This report has been signed electronically. Number of Addenda: 0

## 2019-08-26 NOTE — H&P (Signed)
NAME:  Theresa Stephens, MRN:  [id number], DOB:  Feb 19, 1951, LOS: 0 ADMISSION DATE:  08/26/2019, CONSULTATION DATE: 6/17 REFERRING MD: Roslynn Amble , CHIEF COMPLAINT: Hematemesis, upper GI bleed  Brief History   69 year old female with prior head and neck cancer.  Status post reconstructive surgery 2017.  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  She noted in her usual state of health up until acutely on 6/17.  When she had several episodes of hematemesis with bright red blood and dark coffee colored stool.  She was also noting some blood from around her G-tube stoma. -She had denied any abdominal pain, no heartburn, no shortness of breath or dizziness -Presents accompanied by her daughter -Initial hemoglobin 7.7, typed and screened blood initiated GI consulted critical care asked to admit given concern about potential hemodynamic compromise as lowest recorded blood pressure in the 80s  ->of note her PEG tube was changed 3 days ago. 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  Hemoglobin 7.7 dropped to 7.1 on admission.  Transfuse.  GI consulted.  To endoscopy.  Consults:  GI  Procedures:  EGD 6/17  Significant Diagnostic Tests:    Micro Data:    Antimicrobials:     Interim history/subjective:  Not currently in distress  Objective   Blood pressure (Abnormal) 123/52, pulse (Abnormal) 101, temperature (Abnormal) 97.5 F (36.4 C),  temperature source Axillary, resp. rate 20, SpO2 100 %.        Intake/Output Summary (Last 24 hours) at 08/26/2019 1319 Last data filed at 08/26/2019 1258 Gross per 24 hour  Intake 1500 ml  Output no documentation  Net 1500 ml   There were no vitals filed for this visit.  Examination: General: 69 year old female currently resting comfortably, calm.  No acute distress HENT: Postsurgical malformation of the jaw.  Able to open mouth.  Glossal resection mucous membranes moist but pale Lungs: Clear to auscultation Cardiovascular: Regular rate and rhythm Abdomen: Not tender.  G-tube with blood draining around stoma Extremities: Warm dry Neuro: Awake oriented GU: Due to void  Resolved Hospital Problem list   Right  Assessment & Plan:   Acute blood loss anemia in setting of hematemesis and upper GI bleed -Exact source hopefully to be determined by EGD Plan PPI gtt Serial CBCs For EGD today  Admit to ICU   H/o head and neck cancer s/p resection and later reconstructive surgery 2017 Plan F/u [facility]  Dysphagia/ PEG dependent  Plan NPO  Hyperglycemia Plan q4 cbg May need ssi  Best practice:  Diet: NPO Pain/Anxiety/Delirium protocol (if indicated): na VAP protocol (if indicated): na DVT prophylaxis: scd GI prophylaxis: ppi gtt  Glucose control: q4 cbg Mobility: br  Code Status: full code  Family Communication: updated  Disposition: to ICU for now; although pending ENDO may be able to stepdown   Labs   CBC: Recent Labs  Lab 08/26/19 1129 08/26/19 1146  WBC 7.7  --   NEUTROABS 6.3  --   HGB 7.7* 7.1*  HCT 24.5* 21.0*  MCV 94.6  --   PLT 204  --     Basic Metabolic Panel: Recent Labs  Lab 08/26/19 1129 08/26/19 1146  NA 141 142  K 4.5 4.4  CL 109 107  CO2 23  --   GLUCOSE 150* 150*  BUN 51* 58*  CREATININE 0.60 0.40*  CALCIUM 8.2*  --   MG 1.9  --    GFR: CrCl cannot be calculated (Unknown ideal weight.). Recent Labs  Lab 08/26/19 1129  WBC  7.7    Liver Function Tests: Recent Labs  Lab 08/26/19 1129  AST 23  ALT 7  ALKPHOS 46  BILITOT 0.8  PROT 5.1*  ALBUMIN 3.1*   No results for input(s): LIPASE, AMYLASE in the last 168 hours. No results for input(s): AMMONIA in the last 168 hours.  ABG    Component Value Date/Time   TCO2 27 08/26/2019 1146     Coagulation Profile: Recent Labs  Lab 08/26/19 1129  INR 1.1    Cardiac Enzymes: No results for input(s): CKTOTAL, CKMB, CKMBINDEX, TROPONINI in the last 168 hours.  HbA1C: No results found for: HGBA1C  CBG: No results for input(s): GLUCAP in the last 168 hours.  Review of Systems:   Review of Systems  Constitutional: Positive for malaise/fatigue.  HENT: Negative.   Eyes: Negative.   Respiratory: Positive for shortness of breath.   Cardiovascular: Negative.   Gastrointestinal: Positive for melena, nausea and vomiting.  Genitourinary: Negative.   Skin: Negative.   Neurological: Negative.   Endo/Heme/Allergies: Negative.   Psychiatric/Behavioral: Negative.      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   Surgical History    Past Surgical History:  Procedure Laterality Date  . TONGUE BIOPSY    . TONSILLECTOMY       Social History   reports that she quit smoking about 6 years ago. She has a 45.00 pack-year smoking history. She has never used smokeless tobacco. She reports that she does not drink alcohol and does not use drugs.   Family History   Her family history includes Cancer in her maternal uncle; Cancer (age of onset: 64) in her daughter.   Allergies No Known Allergies   Home Medications  Prior to Admission medications   Medication Sig Start Date End Date Taking? Authorizing Provider  acetaminophen (TYLENOL) 500 MG tablet Take 500 mg by mouth every 6 (six) hours as needed for  moderate pain.   Yes [provider]  loratadine (CLARITIN) 5 MG/5ML syrup Take 10 mg by mouth daily as needed for allergies or rhinitis.   Yes [provider]  Nutritional Supplements (FEEDING SUPPLEMENT, OSMOLITE 1.5 CAL,) LIQD Place 237 mLs into feeding tube 5 (five) times daily.   Yes [provider]  HYDROcodone-acetaminophen (HYCET) 7.5-325 mg/15 ml solution Take 10 mLs by mouth every 4 (four) hours as needed for severe pain. Patient not taking: Reported on 12/22/2015 03/16/15   Sherwood Gambler, MD     Critical care time: 77 min    Erick Colace ACNP-BC [facility] Pager # [phone] OR # [phone] if no answer

## 2019-08-26 NOTE — Interval H&P Note (Signed)
History and Physical Interval Note:  68/female with squamous cell carcinoma of right alveolar ridge, s/p mandibulectomy, glossectomy, s/p radiation and G tube placement with hematemesis and blood from G tube. 08/26/2019 2:52 PM  Theresa Stephens  has presented today for EGD, with the diagnosis of upper GI bleeding.  The various methods of treatment have been discussed with the patient and family. After consideration of risks, benefits and other options for treatment, the patient has consented to  Procedure(s): ESOPHAGOGASTRODUODENOSCOPY (EGD) (N/A) as a surgical intervention.  The patient's history has been reviewed, patient examined, no change in status, stable for surgery.  I have reviewed the patient's chart and labs.  Questions were answered to the patient's satisfaction.     Ronnette Juniper

## 2019-08-26 NOTE — ED Notes (Signed)
RN obtained pt's consent for blood products.

## 2019-08-26 NOTE — Brief Op Note (Signed)
08/26/2019  4:21 PM  PATIENT:  Bradley Ferris  69 y.o. female  PRE-OPERATIVE DIAGNOSIS:  upper GI bleeding  POST-OPERATIVE DIAGNOSIS:  large clot removed from stomach; gastric ulcer with visible vessel - epi injected, cauterized with bicap, 2 clips placed  PROCEDURE:  Procedure(s): ESOPHAGOGASTRODUODENOSCOPY (EGD) (N/A) HOT HEMOSTASIS (ARGON PLASMA COAGULATION/BICAP) (N/A) SCLEROTHERAPY HEMOSTASIS CLIP PLACEMENT  SURGEON:  Surgeon(s) and Role:    Ronnette Juniper, MD - Primary  PHYSICIAN ASSISTANT:   ASSISTANTS:Shannon Love, RN, Mariel Sleet, Tech  ANESTHESIA:   MAC  EBL:  None  BLOOD ADMINISTERED:none  DRAINS: none   LOCAL MEDICATIONS USED:  NONE  SPECIMEN:  None  DISPOSITION OF SPECIMEN: None  COUNTS:  YES  TOURNIQUET:  * No tourniquets in log *  DICTATION: .Dragon Dictation  PLAN OF CARE: Admit to inpatient   PATIENT DISPOSITION:  PACU - hemodynamically stable.   Delay start of Pharmacological VTE agent (>24hrs) due to surgical blood loss or risk of bleeding: not applicable

## 2019-08-26 NOTE — ED Triage Notes (Signed)
Pt here from home via [facility] for sudden onset vomiting bright red blood this morning, pt reports having dark stools as well. 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 Pt was hypotensive per Ems 90/60, given 400 ml NS, Bp now 107/62. Pt aox4, pale, lethargic. VSS

## 2019-08-26 NOTE — Transfer of Care (Signed)
Immediate Anesthesia Transfer of Care Note  Patient: Theresa Stephens  Procedure(s) Performed: ESOPHAGOGASTRODUODENOSCOPY (EGD) (N/A ) HOT HEMOSTASIS (ARGON PLASMA COAGULATION/BICAP) (N/A ) SCLEROTHERAPY HEMOSTASIS CLIP PLACEMENT  Patient Location: PACU  Anesthesia Type:General  Level of Consciousness: awake and drowsy  Airway & Oxygen Therapy: Patient Spontanous Breathing and Patient connected to nasal cannula oxygen  Post-op Assessment: Report given to RN and Post -op Vital signs reviewed and stable  Post vital signs: Reviewed and stable  Last Vitals:  Vitals Value Taken Time  BP 166/83 08/26/19 1621  Temp 36.6 C 08/26/19 1621  Pulse 105 08/26/19 1624  Resp 27 08/26/19 1624  SpO2 100 % 08/26/19 1624  Vitals shown include unvalidated device data.  Last Pain:  Vitals:   08/26/19 1621  TempSrc: Axillary  PainSc:          Complications: No complications documented.

## 2019-08-27 ENCOUNTER — Encounter: Payer: Self-pay | Admitting: Internal Medicine

## 2019-08-27 DIAGNOSIS — K922 Gastrointestinal hemorrhage, unspecified: Secondary | ICD-10-CM

## 2019-08-27 LAB — BASIC METABOLIC PANEL
Anion gap: 5 (ref 5–15)
BUN: 22 mg/dL (ref 8–23)
CO2: 23 mmol/L (ref 22–32)
Calcium: 8.1 mg/dL — ABNORMAL LOW (ref 8.9–10.3)
Chloride: 113 mmol/L — ABNORMAL HIGH (ref 98–111)
Creatinine, Ser: 0.42 mg/dL — ABNORMAL LOW (ref 0.44–1.00)
GFR calc Af Amer: >60 mL/min (ref >60)
GFR calc non Af Amer: >60 mL/min (ref >60)
Glucose, Bld: 138 mg/dL — ABNORMAL HIGH (ref 70–99)
Potassium: 3.8 mmol/L (ref 3.5–5.1)
Sodium: 141 mmol/L (ref 135–145)

## 2019-08-27 LAB — POCT I-STAT 7, (LYTES, BLD GAS, ICA,H+H)
Acid-Base Excess: 0 mmol/L (ref 0.0–2.0)
Bicarbonate: 24.4 mmol/L (ref 20.0–28.0)
Calcium, Ion: 1.26 mmol/L (ref 1.15–1.40)
HCT: 28 % — ABNORMAL LOW (ref 36.0–46.0)
Hemoglobin: 9.5 g/dL — ABNORMAL LOW (ref 12.0–15.0)
O2 Saturation: 100 %
Patient temperature: 97.5
Potassium: 3.7 mmol/L (ref 3.5–5.1)
Sodium: 145 mmol/L (ref 135–145)
TCO2: 26 mmol/L (ref 22–32)
pCO2 arterial: 36.6 mmHg (ref 32.0–48.0)
pH, Arterial: 7.429 (ref 7.350–7.450)
pO2, Arterial: 160 mmHg — ABNORMAL HIGH (ref 83.0–108.0)

## 2019-08-27 LAB — CBC
HCT: 30.9 % — ABNORMAL LOW (ref 36.0–46.0)
Hemoglobin: 10.2 g/dL — ABNORMAL LOW (ref 12.0–15.0)
MCH: 29.6 pg (ref 26.0–34.0)
MCHC: 33 g/dL (ref 30.0–36.0)
MCV: 89.6 fL (ref 80.0–100.0)
Platelets: 182 10*3/uL (ref 150–400)
RBC: 3.45 MIL/uL — ABNORMAL LOW (ref 3.87–5.11)
RDW: 14 % (ref 11.5–15.5)
WBC: 6.6 10*3/uL (ref 4.0–10.5)
nRBC: 0 % (ref 0.0–0.2)

## 2019-08-27 LAB — MAGNESIUM: Magnesium: 1.9 mg/dL (ref 1.7–2.4)

## 2019-08-27 LAB — GLUCOSE, CAPILLARY: Glucose-Capillary: 102 mg/dL — ABNORMAL HIGH (ref 70–99)

## 2019-08-27 LAB — HEMOGLOBIN AND HEMATOCRIT, BLOOD
HCT: 30.8 % — ABNORMAL LOW (ref 36.0–46.0)
Hemoglobin: 9.8 g/dL — ABNORMAL LOW (ref 12.0–15.0)

## 2019-08-27 LAB — PHOSPHORUS: Phosphorus: 2.7 mg/dL (ref 2.5–4.6)

## 2019-08-27 MED ORDER — FREE WATER
200.0000 mL | Freq: Four times a day (QID) | Status: DC
  Administered 2019-08-27 – 2019-08-29 (×8): 200 mL

## 2019-08-27 MED ORDER — SODIUM CHLORIDE 0.9 % IV SOLN: INTRAVENOUS | Status: DC

## 2019-08-27 MED ORDER — OSMOLITE 1.5 CAL PO LIQD
237.0000 mL | Freq: Every day | ORAL | Status: DC
  Administered 2019-08-27 – 2019-08-29 (×9): 237 mL
  Filled 2019-08-27 (×13): qty 237

## 2019-08-27 MED ORDER — FREE WATER: 200.0000 mL | Freq: Four times a day (QID) | Status: DC

## 2019-08-27 MED ORDER — CHLORHEXIDINE GLUCONATE CLOTH 2 % EX PADS
6.0000 | MEDICATED_PAD | Freq: Every day | CUTANEOUS | Status: DC
  Administered 2019-08-27: 6 via TOPICAL

## 2019-08-27 NOTE — Progress Notes (Signed)
Initial Nutrition Assessment  DOCUMENTATION CODES:   Not applicable  INTERVENTION:   Tube Feeding once GI bleed resolves:  Resume home TF regimen of Osmolite 1.5 1 can (237 mL) 5 times daily Provides 1775 kcals, 75 g of protein and 905 mL of free water Meets 100% estimated calorie and protein needs  Additional Free Water Flush of 200 mL QID to meet hydration needs  NUTRITION DIAGNOSIS:   Inadequate oral intake related to dysphagia as evidenced by NPO status.  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 Noted pt with PEG tube exchange 3 days ago.   RD working remotely.  6/17 EGD: large clot removed from stomach, gastric ulcer with visible vessel with hot hemostasis, clip placement  Pt with PEG tube on home TF. Per chart review, pt receives 5 cans of Osmolite 1.5 daily  No weight loss per weight encounters, although most recent weight from 2017.  Current wt 62.3 kg  No skin breakdown noted per RN skin assessment  Labs: reviewed Meds: reviewed   Diet Order:   Diet Order            Diet NPO time specified  Diet effective now                 EDUCATION NEEDS:   No education needs have been identified at this time  Skin:  Skin Assessment: Reviewed RN Assessment  Last BM:  no BM since admission, abdomen soft, BS present  Height:   Ht Readings from Last 1 Encounters:  08/26/19 4\' 11"  (1.499 m)    Weight:   Wt Readings from Last 1 Encounters:  08/27/19 62.3 kg    BMI:  Body mass index is 27.74 kg/m.  Estimated Nutritional Needs:   Kcal:   0349-1791 kcals  Protein:  74-93 g  Fluid:  >/= 1.7 L   Kerman Passey MS, RDN, LDN, CNSC Registered Dietitian III RD Pager Number and RD On-Call Pager Number Located in [place]

## 2019-08-27 NOTE — Progress Notes (Signed)
Subjective: Patient has not had any further hematemesis or melena since endoscopy yesterday.  She reports feeling well.  Objective: Vital signs in last 24 hours: Temp:  [97.5 F (36.4 C)-99.7 F (37.6 C)] 98.2 F (36.8 C) (06/18 1137) Pulse Rate:  [66-112] 66 (06/18 1149) Resp:  [13-31] 25 (06/18 1149) BP: (96-171)/(46-84) 99/54 (06/18 1100) SpO2:  [86 %-100 %] 100 % (06/18 1149) Weight:  [62.2 kg-62.3 kg] 62.3 kg (06/18 0500) Weight change:  Last BM Date:  (PTA)  PE: Changes of head and neck surgery noted, mild pallor GENERAL: Not in distress ABDOMEN: Soft, G-tube in place, nondistended, normal active bowel sounds EXTREMITIES: No deformity  Lab Results: Results for orders placed or performed during the hospital encounter of 08/26/19 (from the past 48 hour(s))  Comprehensive metabolic panel     Status: Abnormal   Collection Time: 08/26/19 11:29 AM  Result Value Ref Range   Sodium 141 135 - 145 mmol/L   Potassium 4.5 3.5 - 5.1 mmol/L   Chloride 109 98 - 111 mmol/L   CO2 23 22 - 32 mmol/L   Glucose, Bld 150 (H) 70 - 99 mg/dL    Comment: Glucose reference range applies only to samples taken after fasting for at least 8 hours.   BUN 51 (H) 8 - 23 mg/dL   Creatinine, Ser 0.60 0.44 - 1.00 mg/dL   Calcium 8.2 (L) 8.9 - 10.3 mg/dL   Total Protein 5.1 (L) 6.5 - 8.1 g/dL   Albumin 3.1 (L) 3.5 - 5.0 g/dL   AST 23 15 - 41 U/L   ALT 7 0 - 44 U/L   Alkaline Phosphatase 46 38 - 126 U/L   Total Bilirubin 0.8 0.3 - 1.2 mg/dL   GFR calc non Af Amer >60 >60 mL/min   GFR calc Af Amer >60 >60 mL/min   Anion gap 9 5 - 15    Comment: Performed at [facility] [address] [place], [place] [address]  CBC WITH DIFFERENTIAL     Status: Abnormal   Collection Time: 08/26/19 11:29 AM  Result Value Ref Range   WBC 7.7 4.0 - 10.5 K/uL   RBC 2.59 (L) 3.87 - 5.11 MIL/uL   Hemoglobin 7.7 (L) 12.0 - 15.0 g/dL   HCT 24.5 (L) 36 - 46 %   MCV 94.6 80.0 - 100.0 fL   MCH 29.7 26.0 - 34.0 pg    MCHC 31.4 30.0 - 36.0 g/dL   RDW 13.2 11.5 - 15.5 %   Platelets 204 150 - 400 K/uL   nRBC 0.0 0.0 - 0.2 %   Neutrophils Relative % 83 %   Neutro Abs 6.3 1.7 - 7.7 K/uL   Lymphocytes Relative 10 %   Lymphs Abs 0.8 0.7 - 4.0 K/uL   Monocytes Relative 7 %   Monocytes Absolute 0.5 0 - 1 K/uL   Eosinophils Relative 0 %   Eosinophils Absolute 0.0 0 - 0 K/uL   Basophils Relative 0 %   Basophils Absolute 0.0 0 - 0 K/uL   Immature Granulocytes 0 %   Abs Immature Granulocytes 0.02 0.00 - 0.07 K/uL    Comment: Performed at [facility], [address] [place], [place] [address]  Protime-INR     Status: None   Collection Time: 08/26/19 11:29 AM  Result Value Ref Range   Prothrombin Time 13.7 11.4 - 15.2 seconds   INR 1.1 0.8 - 1.2    Comment: (NOTE) INR goal varies based  on device and disease states. Performed at [facility], [place] [address] [place], [place] [address]   Type and screen [place]     Status: None (Preliminary result)   Collection Time: 08/26/19 11:29 AM  Result Value Ref Range   ABO/RH(D) B POS    Antibody Screen NEG    Sample Expiration 08/29/2019,2359    Unit Number [id number]    Blood Component Type RBC LR PHER1    Unit division 00    Status of Unit ISSUED    Transfusion Status OK TO TRANSFUSE    Crossmatch Result Compatible    Unit Number [id number]    Blood Component Type RBC LR PHER2    Unit division 00    Status of Unit ISSUED    Transfusion Status OK TO TRANSFUSE    Crossmatch Result Compatible    Unit Number [id number]    Blood Component Type RED CELLS,LR    Unit division 00    Status of Unit ALLOCATED    Transfusion Status OK TO TRANSFUSE    Crossmatch Result      Compatible Performed at [facility], [place] [address] [place], [place] [address]    Unit Number [id number]    Blood Component Type RED CELLS,LR    Unit division 00    Status of Unit ALLOCATED    Transfusion Status OK TO TRANSFUSE     Crossmatch Result Compatible   Magnesium     Status: None   Collection Time: 08/26/19 11:29 AM  Result Value Ref Range   Magnesium 1.9 1.7 - 2.4 mg/dL    Comment: Performed at [facility], [place] [address], [place], [place] [address]  ABO/Rh     Status: None   Collection Time: 08/26/19 11:29 AM  Result Value Ref Range   ABO/RH(D)      B POS Performed at [facility] [address], [place], [place] [address]   POC occult blood, ED     Status: Abnormal   Collection Time: 08/26/19 11:44 AM  Result Value Ref Range   Fecal Occult Bld POSITIVE (A) NEGATIVE  I-Stat Chem 8, ED     Status: Abnormal   Collection Time: 08/26/19 11:46 AM  Result Value Ref Range   Sodium 142 135 - 145 mmol/L   Potassium 4.4 3.5 - 5.1 mmol/L   Chloride 107 98 - 111 mmol/L   BUN 58 (H) 8 - 23 mg/dL   Creatinine, Ser 0.40 (L) 0.44 - 1.00 mg/dL   Glucose, Bld 150 (H) 70 - 99 mg/dL    Comment: Glucose reference range applies only to samples taken after fasting for at least 8 hours.   Calcium, Ion 1.13 (L) 1.15 - 1.40 mmol/L   TCO2 27 22 - 32 mmol/L   Hemoglobin 7.1 (L) 12.0 - 15.0 g/dL   HCT 21.0 (L) 36 - 46 %  Prepare RBC (crossmatch)     Status: None   Collection Time: 08/26/19 11:47 AM  Result Value Ref Range   Order Confirmation      ORDER PROCESSED BY BLOOD BANK Performed at [facility], [place] [address] [place],  [address]   SARS Coronavirus 2 by RT PCR (hospital order, performed in [facility] hospital lab) Nasopharyngeal Nasopharyngeal Swab     Status: None   Collection Time: 08/26/19 12:49 PM   Specimen: Nasopharyngeal Swab  Result Value Ref Range   SARS Coronavirus 2 NEGATIVE NEGATIVE  Comment: (NOTE) SARS-CoV-2 target nucleic acids are NOT DETECTED.  The SARS-CoV-2 RNA is generally detectable in upper and lower respiratory specimens during the acute phase of infection. The lowest concentration of SARS-CoV-2 viral copies this assay can detect is 250 copies / mL.  A negative result does not preclude SARS-CoV-2 infection and should not be used as the sole basis for treatment or other patient management decisions.  A negative result may occur with improper specimen collection / handling, submission of specimen other than nasopharyngeal swab, presence of viral mutation(s) within the areas targeted by this assay, and inadequate number of viral copies (<250 copies / mL). A negative result must be combined with clinical observations, patient history, and epidemiological information.  Fact Sheet for Patients:   StrictlyIdeas.no  Fact Sheet for Healthcare Providers: BankingDealers.co.za  This test is not yet approved or  cleared by the [place] FDA and has been authorized for detection and/or diagnosis of SARS-CoV-2 by FDA under an Emergency Use Authorization (EUA).  This EUA will remain in effect (meaning this test can be used) for the duration of the COVID-19 declaration under Section 564(b)(1) of the Act, 21 U.S.C. section 360bbb-3(b)(1), unless the authorization is terminated or revoked sooner.  Performed at [facility], [place] [address] [place], [place] [address]   Prepare RBC (crossmatch)     Status: None   Collection Time: 08/26/19  2:34 PM  Result Value Ref Range   Order Confirmation      ORDER PROCESSED BY BLOOD BANK Performed at [facility], [place] [address], [place], [place] [address]   MRSA PCR Screening     Status: None   Collection Time: 08/26/19  5:04 PM   Specimen: Nasopharyngeal  Result Value Ref Range   MRSA by PCR NEGATIVE NEGATIVE    Comment:        The GeneXpert MRSA Assay (FDA approved for NASAL specimens only), is one component of a comprehensive MRSA colonization surveillance program. It is not intended to diagnose MRSA infection nor to guide or monitor treatment for MRSA infections. Performed at [facility], [place] [address], [place], [place]  [address]   HIV Antibody (routine testing w rflx)     Status: None   Collection Time: 08/26/19  7:26 PM  Result Value Ref Range   HIV Screen 4th Generation wRfx Non Reactive Non Reactive    Comment: Performed at [facility], [place] [address], [place], [place] [address]  CBC     Status: Abnormal   Collection Time: 08/26/19  7:26 PM  Result Value Ref Range   WBC 8.2 4.0 - 10.5 K/uL   RBC 3.74 (L) 3.87 - 5.11 MIL/uL   Hemoglobin 11.0 (L) 12.0 - 15.0 g/dL    Comment: REPEATED TO VERIFY POST TRANSFUSION SPECIMEN    HCT 33.4 (L) 36 - 46 %   MCV 89.3 80.0 - 100.0 fL   MCH 29.4 26.0 - 34.0 pg   MCHC 32.9 30.0 - 36.0 g/dL   RDW 13.2 11.5 - 15.5 %   Platelets 184 150 - 400 K/uL   nRBC 0.0 0.0 - 0.2 %    Comment: Performed at [facility], [place] [address] [place], [place] [address]  APTT     Status: None   Collection Time: 08/26/19  7:26 PM  Result Value Ref Range   aPTT 25 24 - 36 seconds    Comment: Performed at [facility]  [address] [place], [place] [address]  Protime-INR     Status: None   Collection Time: 08/26/19  7:26 PM  Result Value Ref Range   Prothrombin Time 13.6 11.4 - 15.2 seconds   INR 1.1 0.8 - 1.2    Comment: (NOTE) INR goal varies based on device and disease states. Performed at [facility], [place] [address] [place], [place] [address]   I-STAT 7, (LYTES, BLD GAS, ICA, H+H)     Status: Abnormal   Collection Time: 08/27/19  4:01 AM  Result Value Ref Range   pH, Arterial 7.429 7.35 - 7.45   pCO2 arterial 36.6 32 - 48 mmHg   pO2, Arterial 160 (H) 83 - 108 mmHg   Bicarbonate 24.4 20.0 - 28.0 mmol/L   TCO2 26 22 - 32 mmol/L   O2 Saturation 100.0 %   Acid-Base Excess 0.0 0.0 - 2.0 mmol/L   Sodium 145 135 - 145 mmol/L   Potassium 3.7 3.5 - 5.1 mmol/L   Calcium, Ion 1.26 1.15 - 1.40 mmol/L   HCT 28.0 (L) 36 - 46 %   Hemoglobin 9.5 (L) 12.0 - 15.0 g/dL   Patient temperature 97.5 F    Collection site Radial    Drawn by RT    Sample type  ARTERIAL   Basic metabolic panel     Status: Abnormal   Collection Time: 08/27/19  4:12 AM  Result Value Ref Range   Sodium 141 135 - 145 mmol/L   Potassium 3.8 3.5 - 5.1 mmol/L   Chloride 113 (H) 98 - 111 mmol/L   CO2 23 22 - 32 mmol/L   Glucose, Bld 138 (H) 70 - 99 mg/dL    Comment: Glucose reference range applies only to samples taken after fasting for at least 8 hours.   BUN 22 8 - 23 mg/dL   Creatinine, Ser 0.42 (L) 0.44 - 1.00 mg/dL   Calcium 8.1 (L) 8.9 - 10.3 mg/dL   GFR calc non Af Amer >60 >60 mL/min   GFR calc Af Amer >60 >60 mL/min   Anion gap 5 5 - 15    Comment: Performed at [facility] [address], [place], [place] [address]  CBC     Status: Abnormal   Collection Time: 08/27/19  4:12 AM  Result Value Ref Range   WBC 6.6 4.0 - 10.5 K/uL   RBC 3.45 (L) 3.87 - 5.11 MIL/uL   Hemoglobin 10.2 (L) 12.0 - 15.0 g/dL   HCT 30.9 (L) 36 - 46 %   MCV 89.6 80.0 - 100.0 fL   MCH 29.6 26.0 - 34.0 pg   MCHC 33.0 30.0 - 36.0 g/dL   RDW 14.0 11.5 - 15.5 %   Platelets 182 150 - 400 K/uL   nRBC 0.0 0.0 - 0.2 %    Comment: Performed at [facility], [place] [address], [place], [place] [address]  Magnesium     Status: None   Collection Time: 08/27/19  4:12 AM  Result Value Ref Range   Magnesium 1.9 1.7 - 2.4 mg/dL    Comment: Performed at [facility] [address] [place], [place] [address]  Phosphorus     Status: None   Collection Time: 08/27/19  4:12 AM  Result Value Ref Range   Phosphorus 2.7 2.5 - 4.6 mg/dL    Comment: Performed at [facility] [address] [place], [place] [address]  Glucose, capillary     Status:  Abnormal   Collection Time: 08/27/19  8:52 AM  Result Value Ref Range   Glucose-Capillary 102 (H) 70 - 99 mg/dL    Comment: Glucose reference range applies only to samples taken after fasting for at least 8 hours.    Studies/Results: DG Chest Portable 1 View  Result Date: 08/26/2019 CLINICAL DATA:  GI bleed, difficulty  breathing EXAM: PORTABLE CHEST 1 VIEW COMPARISON:  12/31/2013 FINDINGS: The heart size and mediastinal contours are within normal limits. No focal airspace consolidation, pleural effusion, or pneumothorax. The visualized skeletal structures are unremarkable. Surgical clips within the bilateral axillary regions. IMPRESSION: No active disease. Electronically Signed   By: Davina Poke D.O.   On: 08/26/2019 12:06    Medications: I have reviewed the patient's current medications.  Assessment: Gastric ulcer with visible vessel treated with epinephrine injection, cautery and Endo Clip placement Hemoglobin stable at 10.2, BUN trending down Hemodynamically stable  Plan: Okay to resume tube feedings-Osmolite 1.51 can / 237 mL 5 times a day. Free water flush of 200 cc 4 times a day to meet hydration needs Continue PPI twice daily for at least 2 months, then as needed. H. pylori serology and H. pylori stool antigen pending.  Ronnette Juniper, MD 08/27/2019, 12:16 PM

## 2019-08-27 NOTE — Progress Notes (Signed)
° °  NAME:  Theresa Stephens, MRN:  [id number], DOB:  [birth year], LOS: 1 ADMISSION DATE:  08/26/2019, CONSULTATION DATE: 6/17 REFERRING MD: Roslynn Amble , CHIEF COMPLAINT: Hematemesis, upper GI bleed  Brief History   69 year old female with prior head and neck cancer.  Status post reconstructive surgery 2017.  Presents with acute upper GI bleed and hematemesis on 6/17  History of present illness   69 year old female patient with extensive oral cavity carcinoma status post radical head neck surgery with eventual reconstructive surgery back in 2017 she is PEG dependent at baseline.  She noted in her usual state of health up until acutely on 6/17.  When she had several episodes of hematemesis with bright red blood and dark coffee colored stool.  She was also noting some blood from around her G-tube stoma. -She had denied any abdominal pain, no heartburn, no shortness of breath or dizziness -Presents accompanied by her daughter -Initial hemoglobin 7.7, typed and screened blood initiated GI consulted critical care asked to admit given concern about potential hemodynamic compromise as lowest recorded blood pressure in the 80s  ->of note her PEG tube was changed 3 days ago. 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  Hemoglobin 7.7 dropped to 7.1 on admission.  Transfuse.  GI consulted.  To endoscopy. 6/18 Gastric ulcer injected, cauterized, clipped  Consults:  GI  Procedures:  EGD 6/17  Significant Diagnostic Tests:    Micro Data:    Antimicrobials:     Interim history/subjective:  No events, no further signs of GIB.  Objective   Blood pressure 108/62, pulse 80,  temperature 98.1 F (36.7 C), temperature source Oral, resp. rate (!) 22, height 4\' 11"  (1.499 m), weight 62.3 kg, SpO2 100 %.        Intake/Output Summary (Last 24 hours) at 08/27/2019 0759 Last data filed at 08/27/2019 0600 Gross per 24 hour  Intake 3675.05 ml  Output 1100 ml  Net 2575.05 ml   Filed Weights   08/26/19 1700 08/27/19 0500  Weight: 62.2 kg 62.3 kg    Examination: GEN: no acute distress HEENT: missing jaw, trachea midline CV: RRR, ext warm PULM: Clear, no wheezing GI: Soft, PEG in place EXT: no edema NEURO: Moves all 4 ext to command PSYCH:  SKIN: no rashes  H/h stable   Resolved Hospital Problem list     Assessment & Plan:   Acute blood loss anemia due to gastric ulcer- post successful intervention 6/17 (clip, epi inject, cautery).  No further signs of bleeding. Dysphagia/ PEG dependent  - Resumption of TF per GI - PPI BID - f/u H pylori - H/H q12h, CBC daily - Okay for stepdown, appreciate TRH taking over care.  H/o head and neck cancer s/p resection and later reconstructive surgery 2017 Plan F/u [facility]   Best practice:  Diet: NPO Pain/Anxiety/Delirium protocol (if indicated): na VAP protocol (if indicated): na DVT prophylaxis: scd, pharmacologic resumption clearance per GI GI prophylaxis: ppi bid  Glucose control: N/A Mobility: br  Code Status: full code  Family Communication: updated patient Disposition: PCU  Erskine Emery MD PCCM

## 2019-08-27 NOTE — Progress Notes (Signed)
Received patient from 3MW to room 5W28 via stretcher at 2015. Assisted to bed and positioned for comfort. Oriented to room, bed and unit. In no acute distress. Skin assessment done with Ilsa Iha, Charge RN. Placed on tele monitor and report called to [facility] in [employer].

## 2019-08-27 NOTE — TOC Initial Note (Signed)
Transition of Care [facility]) - Initial/Assessment Note    Patient Details  Name: Theresa Stephens MRN: [id number] Date of Birth: 1950/11/22  [facility] CM/SW Contact:    Bartholomew Crews, RN Phone Number: [phone] 08/27/2019, 1:17 PM  Clinical Narrative:                  Spoke with patient and daughter at the bedside. PTA home with family. Independent with basic adls.   Discussed request from nursing for portable oral suction with yankauers. Referral to [employer], and will deliver to bedside. DME other order sent to MD for cosign.   Patient gets TF from [facility], but has no other DME needs at this time.   Verified PCP in Epic.   One of her daughters will provide transportation home via private vehicle.   TOC following for transition needs.   Expected Discharge Plan: Home/Self Care Barriers to Discharge: Continued Medical Work up   Patient Goals and CMS Choice Patient states their goals for this hospitalization and ongoing recovery are:: return home with family CMS Medicare.gov Compare Post Acute Care list provided to:: Patient Choice offered to / list presented to : Patient, Adult Children  Expected Discharge Plan and Services Expected Discharge Plan: Home/Self Care In-house Referral: Nutrition Discharge Planning Services: CM Consult Post Acute Care Choice: Durable Medical Equipment Living arrangements for the past 2 months: Single Family Home                 DME Arranged: Suction DME Agency: [employer] Date DME Agency Contacted: 08/27/19 Time DME Agency Contacted: [id number] Representative spoke with at DME Agency: [place]: NA [place] Agency: NA        Prior Living Arrangements/Services Living arrangements for the past 2 months: [place] with:: Self, Adult Children Patient language and need for interpreter reviewed:: Yes Do you feel safe going back to the place where you live?: Yes      Need for Family Participation in Patient Care:  Yes (Comment) Care giver support system in place?: Yes (comment) Current home services: Other (comment) (TF) Criminal Activity/Legal Involvement Pertinent to Current Situation/Hospitalization: No - Comment as needed  Activities of Daily Living   ADL Screening (condition at time of admission) Patient's cognitive ability adequate to safely complete daily activities?: Yes Is the patient deaf or have difficulty hearing?: No Does the patient have difficulty seeing, even when wearing glasses/contacts?: No Does the patient have difficulty concentrating, remembering, or making decisions?: No Patient able to express need for assistance with ADLs?: No Does the patient have difficulty dressing or bathing?: No Independently performs ADLs?: Yes (appropriate for developmental age) Does the patient have difficulty walking or climbing stairs?: No Weakness of Legs: None Weakness of Arms/Hands: None  Permission Sought/Granted Permission sought to share information with : Family Supports Permission granted to share information with : Yes, Verbal Permission Granted        Permission granted to share info w Relationship: daughter     Emotional Assessment Appearance:: Appears stated age Attitude/Demeanor/Rapport: Engaged Affect (typically observed): Accepting Orientation: : Oriented to Self, Oriented to  Time, Oriented to Place, Oriented to Situation Alcohol / Substance Use: Not Applicable Psych Involvement: No (comment)  Admission diagnosis:  Acute blood loss anemia [D62] Acute GI bleeding [K92.2] Upper GI bleed [K92.2] UGIB (upper gastrointestinal bleed) [K92.2] Patient Active Problem List   Diagnosis Date Noted  . UGIB (upper gastrointestinal bleed) 08/26/2019  . 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 Mouth pain 03/12/2013   PCP:  Maurice Small, MD Pharmacy:   [id number] [place] [address] [place] [address] Phone: [phone] Fax: [phone]     Social Determinants of Health (SDOH) Interventions    Readmission Risk Interventions No flowsheet data found.

## 2019-08-27 NOTE — Progress Notes (Signed)
[facility]           August 27, 2019    Patient: Theresa Stephens [id number] Date of Birth: 1950-09-20 Date of Visit: 08/27/2019   To Whom it May Concern:  Theresa Stephens is admitted to the ICU and a patient under my care starting 08/26/19 and ongoing, please reach out if any questions or concerns.  Sincerely,     Ina Homes MD [facility] [phone]

## 2019-08-28 DIAGNOSIS — D62 Acute posthemorrhagic anemia: Secondary | ICD-10-CM

## 2019-08-28 DIAGNOSIS — D500 Iron deficiency anemia secondary to blood loss (chronic): Secondary | ICD-10-CM

## 2019-08-28 LAB — BASIC METABOLIC PANEL
Anion gap: 4 — ABNORMAL LOW (ref 5–15)
BUN: 20 mg/dL (ref 8–23)
CO2: 23 mmol/L (ref 22–32)
Calcium: 7.7 mg/dL — ABNORMAL LOW (ref 8.9–10.3)
Chloride: 115 mmol/L — ABNORMAL HIGH (ref 98–111)
Creatinine, Ser: 0.4 mg/dL — ABNORMAL LOW (ref 0.44–1.00)
GFR calc Af Amer: >60 mL/min (ref >60)
GFR calc non Af Amer: >60 mL/min (ref >60)
Glucose, Bld: 107 mg/dL — ABNORMAL HIGH (ref 70–99)
Potassium: 3.8 mmol/L (ref 3.5–5.1)
Sodium: 142 mmol/L (ref 135–145)

## 2019-08-28 LAB — CBC
HCT: 26.1 % — ABNORMAL LOW (ref 36.0–46.0)
Hemoglobin: 8.5 g/dL — ABNORMAL LOW (ref 12.0–15.0)
MCH: 29.9 pg (ref 26.0–34.0)
MCHC: 32.6 g/dL (ref 30.0–36.0)
MCV: 91.9 fL (ref 80.0–100.0)
Platelets: 151 10*3/uL (ref 150–400)
RBC: 2.84 MIL/uL — ABNORMAL LOW (ref 3.87–5.11)
RDW: 14.1 % (ref 11.5–15.5)
WBC: 6.5 10*3/uL (ref 4.0–10.5)
nRBC: 0 % (ref 0.0–0.2)

## 2019-08-28 LAB — HEMOGLOBIN AND HEMATOCRIT, BLOOD
HCT: 27.9 % — ABNORMAL LOW (ref 36.0–46.0)
Hemoglobin: 9 g/dL — ABNORMAL LOW (ref 12.0–15.0)

## 2019-08-28 NOTE — Evaluation (Signed)
Physical Therapy Evaluation/ Discharge Patient Details Name: Theresa Stephens MRN: [id number] DOB: 09-10-1950 Today's Date: 08/28/2019   History of Present Illness  69 yo admitted with GIB s/p EDG with gastric ulcer cauterization and clipping 6/18. 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 Pt able to perform all basic transfers and gait without assist and reports family assist available as needed. Pt at baseline without further therapy needs. Purewick removed and pt educated for ability to get to toilet as needed with staff. Will sign off with pt aware and agreeable.    HR 78 at rest with 125 with gait   Follow Up Recommendations No PT follow up    Equipment Recommendations  None recommended by PT    Recommendations for Other Services       Precautions / Restrictions Precautions Precautions: None      Mobility  Bed Mobility Overal bed mobility: Modified Independent             General bed mobility comments: HOB 15 degrees with use of rail but no apparent reliance on it  Transfers Overall transfer level: Independent                  Ambulation/Gait Ambulation/Gait assistance: Independent Gait Distance (Feet): 300 Feet Assistive device: None Gait Pattern/deviations: WFL(Within Functional Limits)   Gait velocity interpretation: >4.37 ft/sec, indicative of normal walking speed General Gait Details: pt with good stability and speed with SpO2 >92% throughout on RA and noted HR rise to 125 with gait  Stairs            Wheelchair Mobility    Modified Rankin (Stroke Patients Only)       Balance Overall balance assessment: No apparent balance deficits (not formally assessed)                                           Pertinent Vitals/Pain Pain Assessment: No/denies pain    Home Living Family/patient expects to be  discharged to:: Private residence Living Arrangements: Children Available Help at Discharge: Family;Available PRN/intermittently Type of Home: House Home Access: Stairs to enter   Entrance Stairs-Number of Steps: 1 Home Layout: Two level;Able to live on main level with bedroom/bathroom Home Equipment: None      Prior Function Level of Independence: Independent         Comments: likes to cook even though she doesn't eat it and play with her cat and puppy     Hand Dominance        Extremity/Trunk Assessment   Upper Extremity Assessment Upper Extremity Assessment: Overall WFL for tasks assessed    Lower Extremity Assessment Lower Extremity Assessment: Overall WFL for tasks assessed    Cervical / Trunk Assessment Cervical / Trunk Assessment: Normal  Communication   Communication: Expressive difficulties  Cognition Arousal/Alertness: Awake/alert Behavior During Therapy: WFL for tasks assessed/performed Overall Cognitive Status: Within Functional Limits for tasks assessed                                        General Comments      Exercises     Assessment/Plan    PT Assessment Patent does not need  any further PT services  PT Problem List         PT Treatment Interventions      PT Goals (Current goals can be found in the Care Plan section)  Acute Rehab PT Goals PT Goal Formulation: All assessment and education complete, DC therapy    Frequency     Barriers to discharge        Co-evaluation               AM-PAC PT "6 Clicks" Mobility  Outcome Measure Help needed turning from your back to your side while in a flat bed without using bedrails?: None Help needed moving from lying on your back to sitting on the side of a flat bed without using bedrails?: None Help needed moving to and from a bed to a chair (including a wheelchair)?: None Help needed standing up from a chair using your arms (e.g., wheelchair or bedside chair)?:  None Help needed to walk in hospital room?: None Help needed climbing 3-5 steps with a railing? : None 6 Click Score: 24    End of Session   Activity Tolerance: Patient tolerated treatment well Patient left: in chair;with call bell/phone within reach Nurse Communication: Mobility status PT Visit Diagnosis: Other abnormalities of gait and mobility (R26.89)    Time: 1125-1141 PT Time Calculation (min) (ACUTE ONLY): 16 min   Charges:   PT Evaluation $PT Eval Moderate Complexity: 1 Mod          [place], PT Acute Rehabilitation Services Pager: [phone] Office: [place] Ioannis Schuh 08/28/2019, 12:47 PM

## 2019-08-28 NOTE — Progress Notes (Signed)
[facility] PROGRESS NOTE    Progress Note  Theresa Stephens  QIH:[id number] DOB: 14-Jul-1950 DOA: 08/26/2019 PCP: Maurice Small, MD     Brief Narrative:   Theresa Stephens is an 69 y.o. 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  She came in with a hemoglobin of 7.1 she was transfused 1 unit of packed red blood cells, GI was consulted and performed EGD on 08/27/2019 found to have a gastric ulcer injected and cauterized with clipping. Assessment/Plan:   UGIB (upper gastrointestinal bleed)/  Iron deficiency anemia due to chronic blood loss/Acute blood loss anemia Status post EGD on 08/27/2019 status post cauterization and clipping. No further episodes of bleeding and hemoglobin has remained stable, continue IV PPI twice a day. Serology for H. pylori and stool antigen are pending. Resume tube feedings.  History of head and neck squamous cell carcinoma status post resection and reconstructive surgery: Follow[facility] with [facility] as [facility].   DVT prophylaxis: scd's Family Communication:none Status is: Inpatient  Remains inpatient appropriate because:Hemodynamically unstable   Dispo: The patient is from: Home              Anticipated d/c is to: Home              Anticipated d/c date is: 2 days              Patient currently is medically stable to d/c.  Code Status:     Code Status Orders  (From admission, onward)         Start     Ordered   08/26/19 1340  Full code  Continuous        08/26/19 1343        Code Status History    This patient has a current code status but no historical code status.   Advance Care Planning Activity       IV Access:    Peripheral IV   Procedures and diagnostic studies:   DG Chest Portable 1 View  Result Date: 08/26/2019 CLINICAL DATA:  GI  bleed, difficulty breathing EXAM: PORTABLE CHEST 1 VIEW COMPARISON:  12/31/2013 FINDINGS: The heart size and mediastinal contours are within normal limits. No focal airspace consolidation, pleural effusion, or pneumothorax. The visualized skeletal structures are unremarkable. Surgical clips within the bilateral axillary regions. IMPRESSION: No active disease. Electronically Signed   By: Davina Poke D.O.   On: 08/26/2019 12:06     Medical Consultants:    None.  Anti-Infectives:   none  Subjective:    Theresa Stephens she has no new complaints she relates she has had no further melanotic stools through her stoma. Objective:    Vitals:   08/28/19 0000 08/28/19 0400 08/28/19 0806 08/28/19 0819  BP: 96/65 (!) 91/59 (!) 96/55 (!) 99/54  Pulse: 89 74 75 78  Resp: 16 20 (!) 22 (!) 25  Temp: 98.6 F (37 C) 98.1 F (36.7 C) 97.9 F (36.6 C) 98.3 F (36.8 C)  TempSrc: Axillary Axillary Oral Oral  SpO2: 98% 93% 99% 96%  Weight:  61.2 kg    Height:  4\' 11"  (1.499 m)     SpO2: 96 % O2 Flow Rate (L/min): 2 L/min   Intake/Output Summary (Last 24 hours) at 08/28/2019 0842 Last data filed at 08/28/2019 0100 Gross per 24 hour  Intake  1211.08 ml  Output 800 ml  Net 411.08 ml   Filed Weights   08/26/19 1700 08/27/19 0500 08/28/19 0400  Weight: 62.2 kg 62.3 kg 61.2 kg    Exam: General exam: In no acute distress. Respiratory system: Good air movement and clear to auscultation. Cardiovascular system: S1 & S2 heard, RRR. No JVD. Gastrointestinal system: Abdomen is nondistended, soft and nontender.  Extremities: No pedal edema. Skin: No rashes, lesions or ulcers Psychiatry: Judgement and insight appear normal. Mood & affect appropriate.   Data Reviewed:    Labs: Basic Metabolic Panel: Recent Labs  Lab 08/26/19 1129 08/26/19 1129 08/26/19 1146 08/26/19 1146 08/27/19 0401 08/27/19 0401 08/27/19 0412 08/28/19 0243  NA 141  --  142  --  145  --  141 142  K 4.5   < > 4.4    < > 3.7   < > 3.8 3.8  CL 109  --  107  --   --   --  113* 115*  CO2 23  --   --   --   --   --  23 23  GLUCOSE 150*  --  150*  --   --   --  138* 107*  BUN 51*  --  58*  --   --   --  22 20  CREATININE 0.60  --  0.40*  --   --   --  0.42* 0.40*  CALCIUM 8.2*  --   --   --   --   --  8.1* 7.7*  MG 1.9  --   --   --   --   --  1.9  --   PHOS  --   --   --   --   --   --  2.7  --    < > = values in this interval not displayed.   GFR Estimated Creatinine Clearance: 52.1 mL/min (A) (by C-G formula based on SCr of 0.4 mg/dL (L)). Liver Function Tests: Recent Labs  Lab 08/26/19 1129  AST 23  ALT 7  ALKPHOS 46  BILITOT 0.8  PROT 5.1*  ALBUMIN 3.1*   No results for input(s): LIPASE, AMYLASE in the last 168 hours. No results for input(s): AMMONIA in the last 168 hours. Coagulation profile Recent Labs  Lab 08/26/19 1129 08/26/19 1926  INR 1.1 1.1   COVID-19 Labs  No results for input(s): DDIMER, FERRITIN, LDH, CRP in the last 72 hours.  Lab Results  Component Value Date   [place] NEGATIVE 08/26/2019    CBC: Recent Labs  Lab 08/26/19 1129 08/26/19 1146 08/26/19 1926 08/27/19 0401 08/27/19 0412 08/27/19 1347 08/28/19 0243  WBC 7.7  --  8.2  --  6.6  --  6.5  NEUTROABS 6.3  --   --   --   --   --   --   HGB 7.7*   < > 11.0* 9.5* 10.2* 9.8* 8.5*  HCT 24.5*   < > 33.4* 28.0* 30.9* 30.8* 26.1*  MCV 94.6  --  89.3  --  89.6  --  91.9  PLT 204  --  184  --  182  --  151   < > = values in this interval not displayed.   Cardiac Enzymes: No results for input(s): CKTOTAL, CKMB, CKMBINDEX, TROPONINI in the last 168 hours. BNP (last 3 results) No results for input(s): PROBNP in the last 8760 hours. CBG: Recent Labs  Lab 08/27/19 0852  GLUCAP 102*  D-Dimer: No results for input(s): DDIMER in the last 72 hours. Hgb A1c: No results for input(s): HGBA1C in the last 72 hours. Lipid Profile: No results for input(s): CHOL, HDL, LDLCALC, TRIG, CHOLHDL, LDLDIRECT in  the last 72 hours. Thyroid function studies: No results for input(s): TSH, T4TOTAL, T3FREE, THYROIDAB in the last 72 hours.  Invalid input(s): FREET3 Anemia work up: No results for input(s): VITAMINB12, FOLATE, FERRITIN, TIBC, IRON, RETICCTPCT in the last 72 hours. Sepsis Labs: Recent Labs  Lab 08/26/19 1129 08/26/19 1926 08/27/19 0412 08/28/19 0243  WBC 7.7 8.2 6.6 6.5   Microbiology Recent Results (from the past 240 hour(s))  SARS Coronavirus 2 by RT PCR (hospital order, performed in [facility] hospital lab) Nasopharyngeal Nasopharyngeal Swab     Status: None   Collection Time: 08/26/19 12:49 PM   Specimen: Nasopharyngeal Swab  Result Value Ref Range Status   SARS Coronavirus 2 NEGATIVE NEGATIVE Final    Comment: (NOTE) SARS-CoV-2 target nucleic acids are NOT DETECTED.  The SARS-CoV-2 RNA is generally detectable in upper and lower respiratory specimens during the acute phase of infection. The lowest concentration of SARS-CoV-2 viral copies this assay can detect is 250 copies / mL. A negative result does not preclude SARS-CoV-2 infection and should not be used as the sole basis for treatment or other patient management decisions.  A negative result may occur with improper specimen collection / handling, submission of specimen other than nasopharyngeal swab, presence of viral mutation(s) within the areas targeted by this assay, and inadequate number of viral copies (<250 copies / mL). A negative result must be combined with clinical observations, patient history, and epidemiological information.  Fact Sheet for Patients:   StrictlyIdeas.no  Fact Sheet for Healthcare Providers: BankingDealers.co.za  This test is not yet approved or  cleared by the [place] FDA and has been authorized for detection and/or diagnosis of SARS-CoV-2 by FDA under an Emergency Use Authorization (EUA).  This EUA will remain in effect (meaning  this test can be used) for the duration of the COVID-19 declaration under Section 564(b)(1) of the Act, 21 U.S.C. section 360bbb-3(b)(1), unless the authorization is terminated or revoked sooner.  Performed at [facility], [place] [address], [place], [place] [address]   MRSA PCR Screening     Status: None   Collection Time: 08/26/19  5:04 PM   Specimen: Nasopharyngeal  Result Value Ref Range Status   MRSA by PCR NEGATIVE NEGATIVE Final    Comment:        The GeneXpert MRSA Assay (FDA approved for NASAL specimens only), is one component of a comprehensive MRSA colonization surveillance program. It is not intended to diagnose MRSA infection nor to guide or monitor treatment for MRSA infections. Performed at [facility], [place] [address], [place], [place] [address]      Medications:   . sodium chloride   Intravenous Once  . Chlorhexidine Gluconate Cloth  6 each Topical Daily  . feeding supplement (OSMOLITE 1.5 CAL)  237 mL Per Tube 5 X Daily  . free water  200 mL Per Tube Q6H  . mouth rinse  15 mL Mouth Rinse BID  . pantoprazole (PROTONIX) IV  40 mg Intravenous Q12H   Continuous Infusions: . sodium chloride 125 mL/hr at 08/28/19 0203  . sodium chloride        LOS: 2 days   Charlynne Cousins  [facility]  08/28/2019, 8:42 AM

## 2019-08-28 NOTE — Progress Notes (Signed)
   08/28/19 0819  Assess: MEWS Score  Temp 98.3 F (36.8 C)  BP (!) 99/54  Pulse Rate 78  ECG Heart Rate 78  Resp (!) 25  SpO2 96 %  O2 Device Room Air  Assess: MEWS Score  MEWS Temp 0  MEWS Systolic 1  MEWS Pulse 0  MEWS RR 1  MEWS LOC 0  MEWS Score 2  MEWS Score Color Yellow  Assess: if the MEWS score is Yellow or Red  Were vital signs taken at a resting state? Yes  Focused Assessment Documented focused assessment  Early Detection of Sepsis Score *See Row Information* Low  MEWS guidelines implemented *See Row Information* Yes  Treat  MEWS Interventions Other (Comment) (continue to monitor; patient in no distress)  Take Vital Signs  Increase Vital Sign Frequency  Yellow: Q 2hr X 2 then Q 4hr X 2, if remains yellow, continue Q 4hrs  Escalate  MEWS: Escalate Yellow: discuss with charge nurse/RN and consider discussing with provider and RRT  Notify: Charge Nurse/RN  Name of Charge Nurse/RN Notified [name], RN  Date Charge Nurse/RN Notified 08/28/19  Time Charge Nurse/RN Notified [phone]  Notify: Provider  Provider Name/Title Aileen Fass, MD  Date Provider Notified 08/28/19  Time Provider Notified [phone]  Notification Type Page  Notification Reason Other (Comment) (make him aware)  Notify: Rapid Response  Name of Rapid Response RN Notified  (no need for rapid response, patient asymptomatic)  Document  Patient Outcome Other (Comment) (patient asymptomatic)  Progress note created (see row info) Yes

## 2019-08-28 NOTE — Progress Notes (Signed)
Subjective: Patient has been resumed on gastrostomy tube feedings. She has not had a bowel movement since admission. Denies further episodes of hematemesis, nausea or vomiting.  Objective: Vital signs in last 24 hours: Temp:  [97.6 F (36.4 C)-98.6 F (37 C)] 98.2 F (36.8 C) (06/19 1021) Pulse Rate:  [63-89] 79 (06/19 1021) Resp:  [12-26] 23 (06/19 1021) BP: (91-114)/(51-65) 100/53 (06/19 1021) SpO2:  [93 %-100 %] 95 % (06/19 1021) Weight:  [61.2 kg] 61.2 kg (06/19 0400) Weight change: -1 kg Last BM Date: 08/26/19  PE: Lying comfortably on bed, mild pallor GENERAL: Able to speak in few words, not in distress ABDOMEN: G-tube in place, gastrostomy site appears healthy, soft, nondistended, nontender, normoactive bowel sounds EXTREMITIES: No edema  Lab Results: Results for orders placed or performed during the hospital encounter of 08/26/19 (from the past 48 hour(s))  SARS Coronavirus 2 by RT PCR (hospital order, performed in [place] hospital lab) Nasopharyngeal Nasopharyngeal Swab     Status: None   Collection Time: 08/26/19 12:49 PM   Specimen: Nasopharyngeal Swab  Result Value Ref Range   SARS Coronavirus 2 NEGATIVE NEGATIVE    Comment: (NOTE) SARS-CoV-2 target nucleic acids are NOT DETECTED.  The SARS-CoV-2 RNA is generally detectable in upper and lower respiratory specimens during the acute phase of infection. The lowest concentration of SARS-CoV-2 viral copies this assay can detect is 250 copies / mL. A negative result does not preclude SARS-CoV-2 infection and should not be used as the sole basis for treatment or other patient management decisions.  A negative result may occur with improper specimen collection / handling, submission of specimen other than nasopharyngeal swab, presence of viral mutation(s) within the areas targeted by this assay, and inadequate number of viral copies (<250 copies / mL). A negative result must be combined with clinical observations,  patient history, and epidemiological information.  Fact Sheet for Patients:   StrictlyIdeas.no  Fact Sheet for Healthcare Providers: BankingDealers.co.za  This test is not yet approved or  cleared by the [place] FDA and has been authorized for detection and/or diagnosis of SARS-CoV-2 by FDA under an Emergency Use Authorization (EUA).  This EUA will remain in effect (meaning this test can be used) for the duration of the COVID-19 declaration under Section 564(b)(1) of the Act, 21 U.S.C. section 360bbb-3(b)(1), unless the authorization is terminated or revoked sooner.  Performed at [facility], [place] [address], [place], [place] [address]   Prepare RBC (crossmatch)     Status: None   Collection Time: 08/26/19  2:34 PM  Result Value Ref Range   Order Confirmation      ORDER PROCESSED BY BLOOD BANK Performed at [facility], [place] [address], [place], [place] [address]   MRSA PCR Screening     Status: None   Collection Time: 08/26/19  5:04 PM   Specimen: Nasopharyngeal  Result Value Ref Range   MRSA by PCR NEGATIVE NEGATIVE    Comment:        The GeneXpert MRSA Assay (FDA approved for NASAL specimens only), is one component of a comprehensive MRSA colonization surveillance program. It is not intended to diagnose MRSA infection nor to guide or monitor treatment for MRSA infections. Performed at [facility], [place] [address] [place], [place] [address]   HIV Antibody (routine testing w rflx)     Status: None   Collection Time: 08/26/19  7:26 PM  Result Value Ref Range   HIV Screen 4th  Generation wRfx Non Reactive Non Reactive    Comment: Performed at [facility], [place] [address] [place], [place] [address]  CBC     Status: Abnormal   Collection Time: 08/26/19  7:26 PM  Result Value Ref Range   WBC 8.2 4.0 - 10.5 K/uL   RBC 3.74 (L) 3.87 - 5.11 MIL/uL   Hemoglobin 11.0 (L) 12.0 - 15.0 g/dL     Comment: REPEATED TO VERIFY POST TRANSFUSION SPECIMEN    HCT 33.4 (L) 36 - 46 %   MCV 89.3 80.0 - 100.0 fL   MCH 29.4 26.0 - 34.0 pg   MCHC 32.9 30.0 - 36.0 g/dL   RDW 13.2 11.5 - 15.5 %   Platelets 184 150 - 400 K/uL   nRBC 0.0 0.0 - 0.2 %    Comment: Performed at [facility], [place] [address], [place], [place] [address]  APTT     Status: None   Collection Time: 08/26/19  7:26 PM  Result Value Ref Range   aPTT 25 24 - 36 seconds    Comment: Performed at [place] [address] [place], [place] [address]  Protime-INR     Status: None   Collection Time: 08/26/19  7:26 PM  Result Value Ref Range   Prothrombin Time 13.6 11.4 - 15.2 seconds   INR 1.1 0.8 - 1.2    Comment: (NOTE) INR goal varies based on device and disease states. Performed at [facility], [place] [address] [place], [place] [address]   I-STAT 7, (LYTES, BLD GAS, ICA, H+H)     Status: Abnormal   Collection Time: 08/27/19  4:01 AM  Result Value Ref Range   pH, Arterial 7.429 7.35 - 7.45   pCO2 arterial 36.6 32 - 48 mmHg   pO2, Arterial 160 (H) 83 - 108 mmHg   Bicarbonate 24.4 20.0 - 28.0 mmol/L   TCO2 26 22 - 32 mmol/L   O2 Saturation 100.0 %   Acid-Base Excess 0.0 0.0 - 2.0 mmol/L   Sodium 145 135 - 145 mmol/L   Potassium 3.7 3.5 - 5.1 mmol/L   Calcium, Ion 1.26 1.15 - 1.40 mmol/L   HCT 28.0 (L) 36 - 46 %   Hemoglobin 9.5 (L) 12.0 - 15.0 g/dL   Patient temperature 97.5 F    Collection site Radial    Drawn by RT    Sample type ARTERIAL   Basic metabolic panel     Status: Abnormal   Collection Time: 08/27/19  4:12 AM  Result Value Ref Range   Sodium 141 135 - 145 mmol/L   Potassium 3.8 3.5 - 5.1 mmol/L   Chloride 113 (H) 98 - 111 mmol/L   CO2 23 22 - 32 mmol/L   Glucose, Bld 138 (H) 70 - 99 mg/dL    Comment: Glucose reference range applies only to samples taken after fasting for at least 8 hours.   BUN 22 8 - 23 mg/dL   Creatinine, Ser 0.42 (L) 0.44 - 1.00 mg/dL   Calcium 8.1 (L)  8.9 - 10.3 mg/dL   GFR calc non Af Amer >60 >60 mL/min   GFR calc Af Amer >60 >60 mL/min   Anion gap 5 5 - 15    Comment: Performed at [facility] [place] [address], [place],  [address]  CBC     Status: Abnormal   Collection Time: 08/27/19  4:12 AM  Result Value Ref Range  WBC 6.6 4.0 - 10.5 K/uL   RBC 3.45 (L) 3.87 - 5.11 MIL/uL   Hemoglobin 10.2 (L) 12.0 - 15.0 g/dL   HCT 30.9 (L) 36 - 46 %   MCV 89.6 80.0 - 100.0 fL   MCH 29.6 26.0 - 34.0 pg   MCHC 33.0 30.0 - 36.0 g/dL   RDW 14.0 11.5 - 15.5 %   Platelets 182 150 - 400 K/uL   nRBC 0.0 0.0 - 0.2 %    Comment: Performed at [facility], [place] [address] [place], [place] [address]  Magnesium     Status: None   Collection Time: 08/27/19  4:12 AM  Result Value Ref Range   Magnesium 1.9 1.7 - 2.4 mg/dL    Comment: Performed at [place] [address], [place], [place] [address]  Phosphorus     Status: None   Collection Time: 08/27/19  4:12 AM  Result Value Ref Range   Phosphorus 2.7 2.5 - 4.6 mg/dL    Comment: Performed at [facility] [address] [place], [place] [address]  Glucose, capillary     Status: Abnormal   Collection Time: 08/27/19  8:52 AM  Result Value Ref Range   Glucose-Capillary 102 (H) 70 - 99 mg/dL    Comment: Glucose reference range applies only to samples taken after fasting for at least 8 hours.  Hemoglobin and hematocrit, blood     Status: Abnormal   Collection Time: 08/27/19  1:47 PM  Result Value Ref Range   Hemoglobin 9.8 (L) 12.0 - 15.0 g/dL   HCT 30.8 (L) 36 - 46 %    Comment: Performed at [facility] [address] [place], [place] [address]  CBC     Status: Abnormal   Collection Time: 08/28/19  2:43 AM  Result Value Ref Range   WBC 6.5 4.0 - 10.5 K/uL   RBC 2.84 (L) 3.87 - 5.11 MIL/uL   Hemoglobin 8.5 (L) 12.0 - 15.0 g/dL   HCT 26.1 (L) 36 - 46 %   MCV 91.9 80.0 - 100.0 fL   MCH 29.9 26.0 - 34.0 pg   MCHC 32.6 30.0 - 36.0 g/dL   RDW 14.1 11.5  - 15.5 %   Platelets 151 150 - 400 K/uL   nRBC 0.0 0.0 - 0.2 %    Comment: Performed at [facility], [place] [address], [place], [place] [address]  Basic metabolic panel     Status: Abnormal   Collection Time: 08/28/19  2:43 AM  Result Value Ref Range   Sodium 142 135 - 145 mmol/L   Potassium 3.8 3.5 - 5.1 mmol/L   Chloride 115 (H) 98 - 111 mmol/L   CO2 23 22 - 32 mmol/L   Glucose, Bld 107 (H) 70 - 99 mg/dL    Comment: Glucose reference range applies only to samples taken after fasting for at least 8 hours.   BUN 20 8 - 23 mg/dL   Creatinine, Ser 0.40 (L) 0.44 - 1.00 mg/dL   Calcium 7.7 (L) 8.9 - 10.3 mg/dL   GFR calc non Af Amer >60 >60 mL/min   GFR calc Af Amer >60 >60 mL/min   Anion gap 4 (L) 5 - 15    Comment: Performed at [facility] [address] [place], [place] [address]    Studies/Results: No results found.  Medications: I have reviewed the patient's current medications.  Assessment: Gastric ulcer with a  visible vessel treated with epinephrine injection, bipolar cautery and Endo Clip placement No further episodes of bleeding, BUN trending down, hemoglobin relatively stable, 8.5 today Patient has been resumed on G-tube feedings  Plan: Okay to discharge home from GI standpoint. Recommend to continue PPI twice daily, avoid NSAIDs and aspirin at least for a week. H. pylori serology is pending, will follow results as an outpatient.  Ronnette Juniper, MD 08/28/2019, 11:59 AM

## 2019-08-29 LAB — CBC
HCT: 27.8 % — ABNORMAL LOW (ref 36.0–46.0)
Hemoglobin: 8.9 g/dL — ABNORMAL LOW (ref 12.0–15.0)
MCH: 29.6 pg (ref 26.0–34.0)
MCHC: 32 g/dL (ref 30.0–36.0)
MCV: 92.4 fL (ref 80.0–100.0)
Platelets: 179 10*3/uL (ref 150–400)
RBC: 3.01 MIL/uL — ABNORMAL LOW (ref 3.87–5.11)
RDW: 14.2 % (ref 11.5–15.5)
WBC: 5.8 10*3/uL (ref 4.0–10.5)
nRBC: 0 % (ref 0.0–0.2)

## 2019-08-29 MED ORDER — FREE WATER: 200.0000 mL | Freq: Four times a day (QID) | Status: DC

## 2019-08-29 MED ORDER — PANTOPRAZOLE SODIUM 40 MG PO TBEC: 40.0000 mg | DELAYED_RELEASE_TABLET | Freq: Two times a day (BID) | ORAL | Status: DC

## 2019-08-29 MED ORDER — PANTOPRAZOLE SODIUM 40 MG PO TBEC: 40.0000 mg | DELAYED_RELEASE_TABLET | Freq: Two times a day (BID) | ORAL | 3 refills | Status: DC

## 2019-08-29 NOTE — Discharge Summary (Signed)
Physician Discharge Summary  Theresa Stephens DEY:[id number] DOB: 1950/08/18 DOA: 08/26/2019  PCP: Maurice Small, MD  Admit date: 08/26/2019 Discharge date: 08/29/2019  Admitted From: Home Disposition:  Home  Recommendations for Outpatient Follow-up:  1. Follow up with PCP in 1-2 weeks 2. Please obtain BMP/CBC in one week 3. Home health to go home with patient and continue rehab there.  Home Health:Yes Equipment/Devices:None  Discharge Condition:stable CODE STATUS:Full Diet recommendation: Heart Healthy  Brief/Interim Summary: 69 y.o. 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  She came in with a hemoglobin of 7.1 she was transfused 1 unit of packed red blood cells, GI was consulted and performed EGD on 08/27/2019 found to have a gastric ulcer injected and cauterized with clipping.  Discharge Diagnoses:  Active Problems:   UGIB (upper gastrointestinal bleed)   Iron deficiency anemia due to chronic blood loss   Acute blood loss anemia  Upper GI bleed/iron deficiency anemia due to chronic blood loss was acute blood loss anemia: She was fluid resuscitated in the ED GI was consulted to perform an EGD on 08/27/2018 when she status post cauterization and clipping of duodenal ulcer. She had no further episodes of bleeding her hemoglobin remained stable. He was started on IV PPI and admission which was transitioned to oral PPI twice a day at home for 30 days serologic for H. pylori and stool antigen are pending. 2 feedings were resumed he tolerated this. She will follow up with GI as an outpatient.  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 Discharge Instructions  Discharge Instructions    Diet - low  sodium heart healthy   Complete by: As directed    Increase activity slowly   Complete by: As directed      Allergies as of 08/29/2019   No Known Allergies     Medication List    TAKE these medications   acetaminophen 500 MG tablet Commonly known as: TYLENOL Take 500 mg by mouth every 6 (six) hours as needed for moderate pain.   feeding supplement (OSMOLITE 1.5 CAL) Liqd Place 237 mLs into feeding tube 5 (five) times daily.   free water Soln Place 200 mLs into feeding tube every 6 (six) hours.   HYDROcodone-acetaminophen 7.5-325 mg/15 ml solution Commonly known as: HYCET Take 10 mLs by mouth every 4 (four) hours as needed for severe pain.   loratadine 5 MG/5ML syrup Commonly known as: CLARITIN Take 10 mg by mouth daily as needed for allergies or rhinitis.   pantoprazole 40 MG tablet Commonly known as: PROTONIX Take 1 tablet (40 mg total) by mouth 2 (two) times daily.            Durable Medical Equipment  (From admission, onward)         Start     Ordered   08/27/19 1231  For home use only DME Other see comment  Once       Comments: Patient needs portable oral suctioning with yankauers d/t inability to clear secretions  Question:  Length of Need  Answer:  Lifetime   08/27/19 1231          No Known Allergies  Consultations:  GI   Procedures/Studies: DG Chest Portable 1 View  Result Date: 08/26/2019 CLINICAL DATA:  GI bleed, difficulty breathing EXAM: PORTABLE CHEST 1 VIEW COMPARISON:  12/31/2013 FINDINGS: The heart size and mediastinal contours are within normal limits. No focal airspace consolidation, pleural effusion, or pneumothorax. The visualized skeletal structures are unremarkable. Surgical clips within the bilateral axillary regions. IMPRESSION: No active disease. Electronically Signed   By: Davina Poke D.O.   On: 08/26/2019 12:06     Subjective:  No new complaints. Discharge Exam: Vitals:   08/29/19 0359 08/29/19 0800  BP:  (!)  123/58  Pulse:  90  Resp: 20 20  Temp: 98.6 F (37 C) 98.7 F (37.1 C)  SpO2:  92%   Vitals:   08/28/19 2000 08/28/19 2344 08/29/19 0359 08/29/19 0800  BP: (!) 106/54 (!) 110/58  (!) 123/58  Pulse: 86 81  90  Resp: (!) 25 (!) 22 20 20   Temp: 98.5 F (36.9 C) 98 F (36.7 C) 98.6 F (37 C) 98.7 F (37.1 C)  TempSrc: Axillary Axillary Axillary Oral  SpO2: 95% 91%  92%  Weight:      Height:        General: Pt is alert, awake, not in acute distress Cardiovascular: RRR, S1/S2 +, no rubs, no gallops Respiratory: CTA bilaterally, no wheezing, no rhonchi Abdominal: Soft, NT, ND, bowel sounds + Extremities: no edema, no cyanosis    The results of significant diagnostics from this hospitalization (including imaging, microbiology, ancillary and laboratory) are listed below for reference.     Microbiology: Recent Results (from the past 240 hour(s))  SARS Coronavirus 2 by RT PCR (hospital order, performed in [facility] hospital lab) Nasopharyngeal Nasopharyngeal Swab     Status: None   Collection Time: 08/26/19 12:49 PM   Specimen: Nasopharyngeal Swab  Result Value Ref Range Status   SARS Coronavirus 2 NEGATIVE NEGATIVE Final    Comment: (NOTE) SARS-CoV-2 target nucleic acids are NOT DETECTED.  The SARS-CoV-2 RNA is generally detectable in upper and lower respiratory specimens during the acute phase of infection. The lowest concentration of SARS-CoV-2 viral copies this assay can detect is 250 copies / mL. A negative result does not preclude SARS-CoV-2 infection and should not be used as the sole basis for treatment or other patient management decisions.  A negative result may occur with improper specimen collection / handling, submission of specimen other than nasopharyngeal swab, presence of viral mutation(s) within the areas targeted by this assay, and inadequate number of viral copies (<250 copies / mL). A negative result must be combined with clinical observations,  patient history, and epidemiological information.  Fact Sheet for Patients:   StrictlyIdeas.no  Fact Sheet for Healthcare Providers: BankingDealers.co.za  This test is not yet approved or  cleared by the [place] FDA and has been authorized for detection and/or diagnosis of SARS-CoV-2 by FDA under an Emergency Use Authorization (EUA).  This EUA will remain in effect (meaning this test can be used) for the duration of the COVID-19 declaration under Section 564(b)(1) of the Act, 21 U.S.C. section 360bbb-3(b)(1), unless the authorization is terminated or revoked sooner.  Performed at [facility], [place] [address], [place], [place] [address]   MRSA PCR Screening     Status: None   Collection Time: 08/26/19  5:04 PM   Specimen: Nasopharyngeal  Result Value Ref Range Status   MRSA by PCR NEGATIVE NEGATIVE Final    Comment:        The GeneXpert MRSA Assay (FDA approved for NASAL specimens only), is one component of a comprehensive MRSA  colonization surveillance program. It is not intended to diagnose MRSA infection nor to guide or monitor treatment for MRSA infections. Performed at [facility], [place] [address] [place], [place] [address]      Labs: BNP (last 3 results) No results for input(s): BNP in the last 8760 hours. Basic Metabolic Panel: Recent Labs  Lab 08/26/19 1129 08/26/19 1146 08/27/19 0401 08/27/19 0412 08/28/19 0243  NA 141 142 145 141 142  K 4.5 4.4 3.7 3.8 3.8  CL 109 107  --  113* 115*  CO2 23  --   --  23 23  GLUCOSE 150* 150*  --  138* 107*  BUN 51* 58*  --  22 20  CREATININE 0.60 0.40*  --  0.42* 0.40*  CALCIUM 8.2*  --   --  8.1* 7.7*  MG 1.9  --   --  1.9  --   PHOS  --   --   --  2.7  --    Liver Function Tests: Recent Labs  Lab 08/26/19 1129  AST 23  ALT 7  ALKPHOS 46  BILITOT 0.8  PROT 5.1*  ALBUMIN 3.1*   No results for input(s): LIPASE, AMYLASE in the last 168  hours. No results for input(s): AMMONIA in the last 168 hours. CBC: Recent Labs  Lab 08/26/19 1129 08/26/19 1146 08/26/19 1926 08/27/19 0401 08/27/19 0412 08/27/19 1347 08/28/19 0243 08/28/19 1506 08/29/19 0224  WBC 7.7  --  8.2  --  6.6  --  6.5  --  5.8  NEUTROABS 6.3  --   --   --   --   --   --   --   --   HGB 7.7*   < > 11.0*   < > 10.2* 9.8* 8.5* 9.0* 8.9*  HCT 24.5*   < > 33.4*   < > 30.9* 30.8* 26.1* 27.9* 27.8*  MCV 94.6  --  89.3  --  89.6  --  91.9  --  92.4  PLT 204  --  184  --  182  --  151  --  179   < > = values in this interval not displayed.   Cardiac Enzymes: No results for input(s): CKTOTAL, CKMB, CKMBINDEX, TROPONINI in the last 168 hours. BNP: Invalid input(s): POCBNP CBG: Recent Labs  Lab 08/27/19 0852  GLUCAP 102*   D-Dimer No results for input(s): DDIMER in the last 72 hours. Hgb A1c No results for input(s): HGBA1C in the last 72 hours. Lipid Profile No results for input(s): CHOL, HDL, LDLCALC, TRIG, CHOLHDL, LDLDIRECT in the last 72 hours. Thyroid function studies No results for input(s): TSH, T4TOTAL, T3FREE, THYROIDAB in the last 72 hours.  Invalid input(s): FREET3 Anemia work up No results for input(s): VITAMINB12, FOLATE, FERRITIN, TIBC, IRON, RETICCTPCT in the last 72 hours. Urinalysis No results found for: COLORURINE, APPEARANCEUR, [place], [place], [place], [place], [place], [place], PROTEINUR, UROBILINOGEN, NITRITE, LEUKOCYTESUR Sepsis Labs Invalid input(s): PROCALCITONIN,  WBC,  LACTICIDVEN Microbiology Recent Results (from the past 240 hour(s))  SARS Coronavirus 2 by RT PCR (hospital order, performed in [facility] hospital lab) Nasopharyngeal Nasopharyngeal Swab     Status: None   Collection Time: 08/26/19 12:49 PM   Specimen: Nasopharyngeal Swab  Result Value Ref Range Status   SARS Coronavirus 2 NEGATIVE NEGATIVE Final    Comment: (NOTE) SARS-CoV-2 target nucleic acids are NOT DETECTED.  The SARS-CoV-2 RNA is  generally detectable in upper and lower respiratory specimens during the acute phase  of infection. The lowest concentration of SARS-CoV-2 viral copies this assay can detect is 250 copies / mL. A negative result does not preclude SARS-CoV-2 infection and should not be used as the sole basis for treatment or other patient management decisions.  A negative result may occur with improper specimen collection / handling, submission of specimen other than nasopharyngeal swab, presence of viral mutation(s) within the areas targeted by this assay, and inadequate number of viral copies (<250 copies / mL). A negative result must be combined with clinical observations, patient history, and epidemiological information.  Fact Sheet for Patients:   StrictlyIdeas.no  Fact Sheet for Healthcare Providers: BankingDealers.co.za  This test is not yet approved or  cleared by the [place] FDA and has been authorized for detection and/or diagnosis of SARS-CoV-2 by FDA under an Emergency Use Authorization (EUA).  This EUA will remain in effect (meaning this test can be used) for the duration of the COVID-19 declaration under Section 564(b)(1) of the Act, 21 U.S.C. section 360bbb-3(b)(1), unless the authorization is terminated or revoked sooner.  Performed at [facility], [place] [address] [place], [place] [address]   MRSA PCR Screening     Status: None   Collection Time: 08/26/19  5:04 PM   Specimen: Nasopharyngeal  Result Value Ref Range Status   MRSA by PCR NEGATIVE NEGATIVE Final    Comment:        The GeneXpert MRSA Assay (FDA approved for NASAL specimens only), is one component of a comprehensive MRSA colonization surveillance program. It is not intended to diagnose MRSA infection nor to guide or monitor treatment for MRSA infections. Performed at [facility], [place] [address], [place], [place] [address]      Time coordinating  discharge: Over 40 minutes  SIGNED:   Charlynne Cousins, MD  [facility] 08/29/2019, 9:56 AM Pager   If 7PM-7AM, please contact night-coverage [email or web address] Password [email or web address]

## 2019-08-29 NOTE — Progress Notes (Signed)
Patient was discharged home by MD order; discharged instructions  review and give to patient with care notes and prescription; IV DIC; skin intact; patient will be escorted to the car by nurse tech via wheelchair.  

## 2019-08-30 LAB — TYPE AND SCREEN
ABO/RH(D): B POS
Antibody Screen: NEGATIVE
Unit division: 0
Unit division: 0
Unit division: 0
Unit division: 0

## 2019-08-30 LAB — BPAM RBC
Blood Product Expiration Date: 202107062359
Blood Product Expiration Date: 202107062359
Blood Product Expiration Date: 202107072359
Blood Product Expiration Date: 202107082359
ISSUE DATE / TIME: 202106171250
ISSUE DATE / TIME: 202106171500
ISSUE DATE / TIME: 202106171500
Unit Type and Rh: 7300
Unit Type and Rh: 7300
Unit Type and Rh: 7300
Unit Type and Rh: 7300

## 2019-08-30 LAB — H. PYLORI ANTIBODY, IGG: H Pylori IgG: 4.8 Index Value — ABNORMAL HIGH (ref 0.00–0.79)

## 2019-11-30 ENCOUNTER — Other Ambulatory Visit: Payer: Self-pay

## 2019-11-30 ENCOUNTER — Encounter (HOSPITAL_COMMUNITY): Payer: Self-pay | Admitting: Emergency Medicine

## 2019-11-30 ENCOUNTER — Emergency Department (HOSPITAL_COMMUNITY): Payer: Medicare Other

## 2019-11-30 ENCOUNTER — Inpatient Hospital Stay (HOSPITAL_COMMUNITY)
Admission: EM | Admit: 2019-11-30 | Discharge: 2019-12-07 | DRG: 177 | Disposition: A | Discharge status: Home Health | Payer: Medicare Other | Attending: Internal Medicine | Admitting: Internal Medicine

## 2019-11-30 DIAGNOSIS — F419 Anxiety disorder, unspecified: Secondary | ICD-10-CM | POA: Diagnosis present

## 2019-11-30 DIAGNOSIS — J9601 Acute respiratory failure with hypoxia: Secondary | ICD-10-CM | POA: Diagnosis present

## 2019-11-30 DIAGNOSIS — U071 COVID-19: Secondary | ICD-10-CM | POA: Diagnosis present

## 2019-11-30 DIAGNOSIS — C069 Malignant neoplasm of mouth, unspecified: Secondary | ICD-10-CM | POA: Diagnosis present

## 2019-11-30 DIAGNOSIS — D630 Anemia in neoplastic disease: Secondary | ICD-10-CM | POA: Diagnosis present

## 2019-11-30 DIAGNOSIS — Z87891 Personal history of nicotine dependence: Secondary | ICD-10-CM | POA: Diagnosis not present

## 2019-11-30 DIAGNOSIS — R0902 Hypoxemia: Secondary | ICD-10-CM

## 2019-11-30 DIAGNOSIS — F330 Major depressive disorder, recurrent, mild: Secondary | ICD-10-CM | POA: Diagnosis present

## 2019-11-30 DIAGNOSIS — Z23 Encounter for immunization: Secondary | ICD-10-CM

## 2019-11-30 DIAGNOSIS — F329 Major depressive disorder, single episode, unspecified: Secondary | ICD-10-CM | POA: Diagnosis not present

## 2019-11-30 DIAGNOSIS — J1282 Pneumonia due to coronavirus disease 2019: Secondary | ICD-10-CM | POA: Diagnosis present

## 2019-11-30 DIAGNOSIS — F32A Depression, unspecified: Secondary | ICD-10-CM

## 2019-11-30 DIAGNOSIS — I700 Atherosclerosis of aorta: Secondary | ICD-10-CM | POA: Diagnosis present

## 2019-11-30 DIAGNOSIS — Z923 Personal history of irradiation: Secondary | ICD-10-CM

## 2019-11-30 DIAGNOSIS — Z79899 Other long term (current) drug therapy: Secondary | ICD-10-CM | POA: Diagnosis not present

## 2019-11-30 DIAGNOSIS — K219 Gastro-esophageal reflux disease without esophagitis: Secondary | ICD-10-CM | POA: Diagnosis not present

## 2019-11-30 DIAGNOSIS — R197 Diarrhea, unspecified: Secondary | ICD-10-CM | POA: Diagnosis present

## 2019-11-30 DIAGNOSIS — R1310 Dysphagia, unspecified: Secondary | ICD-10-CM | POA: Diagnosis present

## 2019-11-30 DIAGNOSIS — R45851 Suicidal ideations: Secondary | ICD-10-CM

## 2019-11-30 DIAGNOSIS — Z801 Family history of malignant neoplasm of trachea, bronchus and lung: Secondary | ICD-10-CM | POA: Diagnosis not present

## 2019-11-30 DIAGNOSIS — R1030 Lower abdominal pain, unspecified: Secondary | ICD-10-CM | POA: Diagnosis present

## 2019-11-30 LAB — COMPREHENSIVE METABOLIC PANEL
ALT: 61 U/L — ABNORMAL HIGH (ref 0–44)
AST: 62 U/L — ABNORMAL HIGH (ref 15–41)
Albumin: 3.5 g/dL (ref 3.5–5.0)
Alkaline Phosphatase: 105 U/L (ref 38–126)
Anion gap: 12 (ref 5–15)
BUN: 16 mg/dL (ref 8–23)
CO2: 27 mmol/L (ref 22–32)
Calcium: 9.2 mg/dL (ref 8.9–10.3)
Chloride: 100 mmol/L (ref 98–111)
Creatinine, Ser: 0.42 mg/dL — ABNORMAL LOW (ref 0.44–1.00)
GFR calc Af Amer: >60 mL/min (ref >60)
GFR calc non Af Amer: >60 mL/min (ref >60)
Glucose, Bld: 105 mg/dL — ABNORMAL HIGH (ref 70–99)
Potassium: 4.2 mmol/L (ref 3.5–5.1)
Sodium: 139 mmol/L (ref 135–145)
Total Bilirubin: 0.3 mg/dL (ref 0.3–1.2)
Total Protein: 7.1 g/dL (ref 6.5–8.1)

## 2019-11-30 LAB — CBC WITH DIFFERENTIAL/PLATELET
Abs Immature Granulocytes: 0.03 10*3/uL (ref 0.00–0.07)
Basophils Absolute: 0 10*3/uL (ref 0.0–0.1)
Basophils Relative: 0 %
Eosinophils Absolute: 0 10*3/uL (ref 0.0–0.5)
Eosinophils Relative: 1 %
HCT: 34.2 % — ABNORMAL LOW (ref 36.0–46.0)
Hemoglobin: 11.1 g/dL — ABNORMAL LOW (ref 12.0–15.0)
Immature Granulocytes: 1 %
Lymphocytes Relative: 15 %
Lymphs Abs: 0.6 10*3/uL — ABNORMAL LOW (ref 0.7–4.0)
MCH: 26.9 pg (ref 26.0–34.0)
MCHC: 32.5 g/dL (ref 30.0–36.0)
MCV: 82.8 fL (ref 80.0–100.0)
Monocytes Absolute: 0.5 10*3/uL (ref 0.1–1.0)
Monocytes Relative: 12 %
Neutro Abs: 2.9 10*3/uL (ref 1.7–7.7)
Neutrophils Relative %: 71 %
Platelets: 365 10*3/uL (ref 150–400)
RBC: 4.13 MIL/uL (ref 3.87–5.11)
RDW: 14.6 % (ref 11.5–15.5)
WBC: 4 10*3/uL (ref 4.0–10.5)
nRBC: 0 % (ref 0.0–0.2)

## 2019-11-30 LAB — D-DIMER, QUANTITATIVE: D-Dimer, Quant: 0.92 ug/mL-FEU — ABNORMAL HIGH (ref 0.00–0.50)

## 2019-11-30 LAB — FERRITIN: Ferritin: 66 ng/mL (ref 11–307)

## 2019-11-30 LAB — FIBRINOGEN: Fibrinogen: 630 mg/dL — ABNORMAL HIGH (ref 210–475)

## 2019-11-30 LAB — SARS CORONAVIRUS 2 BY RT PCR (HOSPITAL ORDER, PERFORMED IN ~~LOC~~ HOSPITAL LAB): SARS Coronavirus 2: POSITIVE — AB

## 2019-11-30 LAB — BRAIN NATRIURETIC PEPTIDE: B Natriuretic Peptide: 37.1 pg/mL (ref 0.0–100.0)

## 2019-11-30 LAB — URINALYSIS, ROUTINE W REFLEX MICROSCOPIC
Bilirubin Urine: NEGATIVE
Glucose, UA: NEGATIVE mg/dL
Hgb urine dipstick: NEGATIVE
Ketones, ur: NEGATIVE mg/dL
Leukocytes,Ua: NEGATIVE
Nitrite: NEGATIVE
Protein, ur: NEGATIVE mg/dL
Specific Gravity, Urine: 1.04 — ABNORMAL HIGH (ref 1.005–1.030)
pH: 8 (ref 5.0–8.0)

## 2019-11-30 LAB — TRIGLYCERIDES: Triglycerides: 112 mg/dL (ref <150)

## 2019-11-30 LAB — LACTATE DEHYDROGENASE: LDH: 194 U/L — ABNORMAL HIGH (ref 98–192)

## 2019-11-30 LAB — LIPASE, BLOOD: Lipase: 39 U/L (ref 11–51)

## 2019-11-30 LAB — C-REACTIVE PROTEIN: CRP: 2 mg/dL — ABNORMAL HIGH (ref <1.0)

## 2019-11-30 LAB — LACTIC ACID, PLASMA: Lactic Acid, Venous: 1.5 mmol/L (ref 0.5–1.9)

## 2019-11-30 LAB — PROCALCITONIN: Procalcitonin: <0.1 ng/mL

## 2019-11-30 MED ORDER — ASPIRIN EC 81 MG PO TBEC
81.0000 mg | DELAYED_RELEASE_TABLET | Freq: Every day | ORAL | Status: DC
  Administered 2019-12-01 – 2019-12-07 (×7): 81 mg via ORAL
  Filled 2019-11-30 (×7): qty 1

## 2019-11-30 MED ORDER — TRAZODONE HCL 50 MG PO TABS
25.0000 mg | ORAL_TABLET | Freq: Every evening | ORAL | Status: DC | PRN
  Administered 2019-12-01 – 2019-12-04 (×4): 25 mg via ORAL
  Filled 2019-11-30 (×4): qty 1

## 2019-11-30 MED ORDER — ENOXAPARIN SODIUM 40 MG/0.4ML ~~LOC~~ SOLN
40.0000 mg | Freq: Every day | SUBCUTANEOUS | Status: DC
  Administered 2019-12-01 – 2019-12-06 (×6): 40 mg via SUBCUTANEOUS
  Filled 2019-11-30 (×7): qty 0.4

## 2019-11-30 MED ORDER — PREDNISONE 50 MG PO TABS: 50.0000 mg | ORAL_TABLET | Freq: Every day | ORAL | Status: DC

## 2019-11-30 MED ORDER — SODIUM CHLORIDE 0.9 % IV SOLN
100.0000 mg | Freq: Every day | INTRAVENOUS | Status: AC
  Administered 2019-12-01 – 2019-12-04 (×4): 100 mg via INTRAVENOUS
  Filled 2019-11-30 (×4): qty 20

## 2019-11-30 MED ORDER — MAGNESIUM HYDROXIDE 400 MG/5ML PO SUSP: 30.0000 mL | Freq: Every day | ORAL | Status: DC | PRN

## 2019-11-30 MED ORDER — LORATADINE 5 MG/5ML PO SYRP
10.0000 mg | ORAL_SOLUTION | Freq: Every day | ORAL | Status: DC | PRN
  Filled 2019-11-30: qty 10

## 2019-11-30 MED ORDER — VITAMIN D 25 MCG (1000 UNIT) PO TABS
1000.0000 [IU] | ORAL_TABLET | Freq: Every day | ORAL | Status: DC
  Administered 2019-12-01 – 2019-12-06 (×6): 1000 [IU] via ORAL
  Filled 2019-11-30 (×7): qty 1

## 2019-11-30 MED ORDER — ACETAMINOPHEN 325 MG PO TABS
650.0000 mg | ORAL_TABLET | Freq: Four times a day (QID) | ORAL | Status: DC | PRN
  Administered 2019-12-02: 650 mg via ORAL
  Filled 2019-11-30: qty 2

## 2019-11-30 MED ORDER — ASCORBIC ACID 500 MG PO TABS
500.0000 mg | ORAL_TABLET | Freq: Every day | ORAL | Status: DC
  Administered 2019-12-01 – 2019-12-06 (×6): 500 mg via ORAL
  Filled 2019-11-30 (×7): qty 1

## 2019-11-30 MED ORDER — FAMOTIDINE 20 MG PO TABS
20.0000 mg | ORAL_TABLET | Freq: Two times a day (BID) | ORAL | Status: DC
  Administered 2019-12-01 – 2019-12-07 (×14): 20 mg via ORAL
  Filled 2019-11-30 (×14): qty 1

## 2019-11-30 MED ORDER — SODIUM CHLORIDE 0.9 % IV SOLN: INTRAVENOUS | Status: DC

## 2019-11-30 MED ORDER — ONDANSETRON HCL 4 MG/2ML IJ SOLN: 4.0000 mg | Freq: Four times a day (QID) | INTRAMUSCULAR | Status: DC | PRN

## 2019-11-30 MED ORDER — ZINC SULFATE 220 (50 ZN) MG PO CAPS
220.0000 mg | ORAL_CAPSULE | Freq: Every day | ORAL | Status: DC
  Administered 2019-12-01 – 2019-12-07 (×7): 220 mg via ORAL
  Filled 2019-11-30 (×7): qty 1

## 2019-11-30 MED ORDER — BARICITINIB 2 MG PO TABS
4.0000 mg | ORAL_TABLET | Freq: Every day | ORAL | Status: DC
  Administered 2019-12-01 – 2019-12-06 (×6): 4 mg
  Filled 2019-11-30 (×6): qty 2

## 2019-11-30 MED ORDER — PANTOPRAZOLE SODIUM 40 MG PO TBEC
40.0000 mg | DELAYED_RELEASE_TABLET | Freq: Two times a day (BID) | ORAL | Status: DC
  Filled 2019-11-30: qty 1

## 2019-11-30 MED ORDER — SODIUM CHLORIDE 0.9 % IV SOLN: 100.0000 mg | Freq: Every day | INTRAVENOUS | Status: DC

## 2019-11-30 MED ORDER — SODIUM CHLORIDE 0.9 % IV SOLN
200.0000 mg | Freq: Once | INTRAVENOUS | Status: AC
  Administered 2019-11-30: 200 mg via INTRAVENOUS
  Filled 2019-11-30: qty 200

## 2019-11-30 MED ORDER — ONDANSETRON HCL 4 MG PO TABS: 4.0000 mg | ORAL_TABLET | Freq: Four times a day (QID) | ORAL | Status: DC | PRN

## 2019-11-30 MED ORDER — IOHEXOL 350 MG/ML SOLN
100.0000 mL | Freq: Once | INTRAVENOUS | Status: AC | PRN
  Administered 2019-11-30: 100 mL via INTRAVENOUS

## 2019-11-30 MED ORDER — DEXAMETHASONE SODIUM PHOSPHATE 10 MG/ML IJ SOLN
6.0000 mg | Freq: Once | INTRAMUSCULAR | Status: AC
  Administered 2019-11-30: 6 mg via INTRAVENOUS
  Filled 2019-11-30: qty 1

## 2019-11-30 MED ORDER — SODIUM CHLORIDE 0.9 % IV SOLN: 200.0000 mg | Freq: Once | INTRAVENOUS | Status: DC

## 2019-11-30 MED ORDER — GUAIFENESIN ER 600 MG PO TB12
600.0000 mg | ORAL_TABLET | Freq: Two times a day (BID) | ORAL | Status: DC
  Filled 2019-11-30: qty 1

## 2019-11-30 MED ORDER — HYDROCOD POLST-CPM POLST ER 10-8 MG/5ML PO SUER: 5.0000 mL | Freq: Two times a day (BID) | ORAL | Status: DC | PRN

## 2019-11-30 MED ORDER — METHYLPREDNISOLONE SODIUM SUCC 125 MG IJ SOLR
60.0000 mg | Freq: Two times a day (BID) | INTRAMUSCULAR | Status: DC
  Administered 2019-12-01: 60 mg via INTRAVENOUS
  Filled 2019-11-30: qty 2

## 2019-11-30 MED ORDER — GUAIFENESIN-DM 100-10 MG/5ML PO SYRP: 10.0000 mL | ORAL_SOLUTION | ORAL | Status: DC | PRN

## 2019-11-30 NOTE — ED Provider Notes (Signed)
Patient seen/examined in the Emergency Department in conjunction with Advanced Practice Provider [facility] Patient reports "not feeling well" and SOB over past day.  Reports home diagnosed with COVID one month ago.  She is unvaccinated Exam : awake/alert, tachypneic, right sided crackles noted, no focal ABD Tenderness, PEG tube noted Plan: imaging/labs and will need admission     Ripley Fraise, MD 11/30/19 2029

## 2019-11-30 NOTE — H&P (Signed)
[place]   PATIENT NAME: Theresa Stephens    MR#:  [id number]  DATE OF BIRTH:  [birth year]  DATE OF ADMISSION:  11/30/2019  PRIMARY CARE PHYSICIAN: Maurice Small, MD   REQUESTING/REFERRING PHYSICIAN: Delia Heady, PA-C  CHIEF COMPLAINT:   Chief Complaint  Patient presents with  . Abdominal Pain  . Suicidal    HISTORY OF PRESENT ILLNESS:  Theresa Stephens  is a 69 y.o. 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  She admitted to occasional fever and chills.  She has been coughing yellowish and occasionally brownish sputum.  No loss of taste or smell.  She admitted to body aches and generalized fatigue as well as diarrhea without nausea or vomiting.  She had left posterior chest pain only with cough.  No wheezing.  No bleeding diathesis.  No dysuria, oliguria or hematuria or flank pain.  Upon presentation to the emergency room, his pulse oximetry was 87% on room air at rest and 93% on 2 L of O2 by nasal cannula and later 95%.  Vital signs otherwise were within normal.  Labs revealed an AST of 62 and ALT of 61, BNP of 37.1.  Her LDH was 194 and ferritin 66 with CRP of 2 and procalcitonin less than 0.1.  Lactic acid was 1.5.  CBC showed mild anemia and D-dimer 0.92 with fibrinogen 630.  UA showed high specific gravity 1040.  Blood cultures were drawn.  Chest x-ray showed mild vascular congestion without overt edema.  Chest CTA revealed no evidence for PE but showed patchy airspace opacity in the right lung consistent with multifocal pneumonia and stable hypodense nodule dating back to 2015 and that has been biopsied and showed to be benign etiology.  It showed aortic atherosclerosis.  The patient was given 6 mg of IV Decadron was ordered IV remdesivir.  She will be admitted to a telemetry bed for  further evaluation and management. PAST MEDICAL HISTORY:   Past Medical History:  Diagnosis Date  . Mouth pain 03/12/2013  . 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 ESOPHAGOGASTRODUODENOSCOPY N/A 08/26/2019   Procedure: ESOPHAGOGASTRODUODENOSCOPY (EGD);  Surgeon: Ronnette Juniper, MD;  Location: [facility];  Service: Gastroenterology;  Laterality: N/A;  . HEMOSTASIS CLIP PLACEMENT  08/26/2019   Procedure: HEMOSTASIS CLIP PLACEMENT;  Surgeon: Ronnette Juniper, MD;  Location: [place] [facility];  Service: Gastroenterology;;  . HOT HEMOSTASIS N/A 08/26/2019   Procedure: HOT HEMOSTASIS (ARGON PLASMA COAGULATION/BICAP);  Surgeon: Ronnette Juniper, MD;  Location: [facility];  Service: Gastroenterology;  Laterality: N/A;  . SCLEROTHERAPY  08/26/2019   Procedure: SCLEROTHERAPY;  Surgeon: Ronnette Juniper, MD;  Location: [place] [facility] [place];  Service: Gastroenterology;;  . TONGUE BIOPSY    . TONSILLECTOMY    Tracheotomy, total glossectomy, right and anterior mandibulectomy and left parascapular osteocutaneous free flap for floor of mouth and mandible reconstruction And minimal  SOCIAL HISTORY:   Social History   Tobacco Use  . Smoking status: Former Smoker    Packs/day: 1.00    Years: 45.00    Pack years: 45.00    Quit date: 03/04/2013    Years  since quitting: 6.7  . Smokeless tobacco: Never Used  . Tobacco comment: stopped smoking 02/2013  Substance Use Topics  . Alcohol use: No    FAMILY HISTORY:   Family History  Problem Relation Age of Onset  . Cancer Maternal Uncle        lung ca  . Cancer Daughter 18       breast ca    DRUG ALLERGIES:  No Known Allergies  REVIEW OF SYSTEMS:   ROS As per history of present illness. All pertinent systems were reviewed above. Constitutional, HEENT,  cardiovascular, respiratory, GI, GU, musculoskeletal, neuro, psychiatric, endocrine, integumentary and hematologic systems were reviewed and are otherwise negative/unremarkable except for positive findings mentioned above in the HPI.   MEDICATIONS AT HOME:   Prior to Admission medications   Medication Sig Start Date End Date Taking? Authorizing Provider  acetaminophen (TYLENOL) 500 MG tablet Take 500 mg by mouth every 6 (six) hours as needed for moderate pain.    [provider]  loratadine (CLARITIN) 5 MG/5ML syrup Take 10 mg by mouth daily as needed for allergies or rhinitis.    [provider]  Nutritional Supplements (FEEDING SUPPLEMENT, OSMOLITE 1.5 CAL,) LIQD Place 237 mLs into feeding tube 5 (five) times daily.    [provider]  pantoprazole (PROTONIX) 40 MG tablet Take 1 tablet (40 mg total) by mouth 2 (two) times daily. 08/29/19   Charlynne Cousins, MD  Water For Irrigation, Sterile (FREE WATER) SOLN Place 200 mLs into feeding tube every 6 (six) hours. 08/29/19   Charlynne Cousins, MD      VITAL SIGNS:  Blood pressure 134/77, pulse 88, temperature 97.8 F (36.6 C), temperature source Oral, resp. rate (!) 22, SpO2 97 %.  PHYSICAL EXAMINATION:  Physical Exam  GENERAL:  69 y.o.-year-old frail Caucasian female patient lying in the bed with mild respiratory distress with conversational dyspnea. EYES: Pupils equal, round, reactive to light and accommodation. No scleral icterus. Extraocular muscles intact.  HEENT: Head atraumatic, normocephalic. Oropharynx and nasopharynx clear.  NECK:  Supple, no jugular venous distention. No thyroid enlargement, no tenderness.  LUNGS: Diminished right basal midlung zone breath sounds with associated crackles. CARDIOVASCULAR: Regular rate and rhythm, S1, S2 normal. No murmurs, rubs, or gallops.  ABDOMEN: Soft, nondistended, nontender. Bowel sounds present. No organomegaly or mass.  EXTREMITIES: No pedal edema,  cyanosis, or clubbing.  NEUROLOGIC: Cranial nerves II through XII are intact. Muscle strength 5/5 in all extremities. Sensation intact. Gait not checked.  PSYCHIATRIC: The patient is alert and oriented x 3.  Normal affect and good eye contact. SKIN: No obvious rash, lesion, or ulcer.   LABORATORY PANEL:   CBC Recent Labs  Lab 11/30/19 2028  WBC 4.0  HGB 11.1*  HCT 34.2*  PLT 365   ------------------------------------------------------------------------------------------------------------------  Chemistries  Recent Labs  Lab 11/30/19 2028  NA 139  K 4.2  CL 100  CO2 27  GLUCOSE 105*  BUN 16  CREATININE 0.42*  CALCIUM 9.2  AST 62*  ALT 61*  ALKPHOS 105  BILITOT 0.3   ------------------------------------------------------------------------------------------------------------------  Cardiac Enzymes No results for input(s): TROPONINI in the last 168 hours. ------------------------------------------------------------------------------------------------------------------  RADIOLOGY:  CT Angio Chest PE W/Cm &/Or Wo Cm  Result Date: 11/30/2019 CLINICAL DATA:  Chest pain and tachypnea EXAM: CT ANGIOGRAPHY CHEST WITH CONTRAST TECHNIQUE: Multidetector CT imaging of the chest was performed using the standard protocol during bolus administration of intravenous contrast. Multiplanar CT image reconstructions and MIPs were obtained to evaluate  the vascular anatomy. CONTRAST:  17mL OMNIPAQUE IOHEXOL 350 MG/ML SOLN COMPARISON:  Chest x-ray from earlier in the same day, CT from 2015. FINDINGS: Cardiovascular: Thoracic aorta demonstrates atherosclerotic calcifications without aneurysmal dilatation or dissection. No cardiac enlargement is noted. The pulmonary artery is well visualized with a normal branching pattern. Evaluation of the lower lobe branches is somewhat limited by patient motion artifact although no definitive filling defect is seen. Mediastinum/Nodes: Thoracic inlet is within  normal limits. Hypodense nodule is noted in the right lobe of the thyroid relatively stable from the prior exam of 2015. Esophagus is within normal limits. No sizable hilar or mediastinal adenopathy is noted. A few small right hilar lymph nodes are seen likely reactive in nature. Lungs/Pleura: Lungs are well aerated bilaterally. Patchy ground-glass airspace opacities are identified within the right lung. This is consistent with multifocal pneumonia. Correlate with COVID-19 testing which is pending. Upper Abdomen: Visualized upper abdomen shows no acute abnormality. Musculoskeletal: Hemangioma is noted in the L1 vertebral body. Multilevel vertebral degenerative changes are seen in the thoracic spine. No other focal abnormality is noted. Review of the MIP images confirms the above findings. IMPRESSION: No definitive pulmonary emboli are seen. Patchy airspace opacity in the right lung consistent with multifocal pneumonia. Correlate with pending COVID-19 testing. Stable hypodense nodule dating back to 2015. This has been biopsied and shown to be benign in etiology. No followup recommended (ref: J Am Coll Radiol. 2015 Feb;12(2): 143-50). Aortic Atherosclerosis (ICD10-I70.0). Electronically Signed   By: Inez Catalina M.D.   On: 11/30/2019 21:45   DG Chest Portable 1 View  Result Date: 11/30/2019 CLINICAL DATA:  Suicidal ideation, lower abdominal pain, tachypnea, hypoxia EXAM: PORTABLE CHEST 1 VIEW COMPARISON:  08/26/2019 FINDINGS: Single frontal view of the chest demonstrates a stable cardiac silhouette. No airspace disease, effusion, or pneumothorax. Mild central vascular congestion. No acute bony abnormality. IMPRESSION: 1. Mild central vascular congestion without overt edema. Electronically Signed   By: Randa Ngo M.D.   On: 11/30/2019 20:12      IMPRESSION AND PLAN:   1.  Acute hypoxemic respiratory failure secondary to COVID-19. -The patient will be admitted to a telemetry isolation bed. -O2 protocol  will be followed to keep O2 saturation above 93.   2.  Multifocal pneumonia secondary to COVID-19. -The patient will be admitted to an isolation telemetry bed with droplet and contact precautions. -The patient will be placed on scheduled Mucinex and as needed Tussionex. -We will avoid nebulization as much as we can, give bronchodilator MDI if needed. -Will obtain sputum Gram stain culture and sensitivity and follow blood cultures. -O2 protocol will be followed. -We will follow CRP, ferritin, LDH and D-dimer. -Will follow manual differential for ANC/ALC ratio as well as follow troponin I and daily CBC with manual differential and CMP. - Will place the patient on IV Remdesivir and IV steroid therapy with Decadron with elevated inflammatory markers. -The patient will be placed on vitamin D3, vitamin C, zinc sulfate, p.o. Pepcid and aspirin. -I discussed Baricitinib and the patient agreed to proceed with it.  3.  GERD with history of GI bleeding. -We will continue PPI therapy.  4.  DVT prophylaxis. -Subcutaneous Lovenox.   All the records are reviewed and case discussed with ED provider. The plan of care was discussed in details with the patient (and family). I answered all questions. The patient agreed to proceed with the above mentioned plan. Further management will depend upon hospital course.   CODE STATUS: Full code  Status is: Inpatient  Remains inpatient appropriate because:Ongoing diagnostic testing needed not appropriate for outpatient work up, Unsafe d/c plan, IV treatments appropriate due to intensity of illness or inability to take PO and Inpatient level of care appropriate due to severity of illness   Dispo: The patient is from: Home              Anticipated d/c is to: Home              Anticipated d/c date is: 3 days              Patient currently is not medically stable to d/c.   TOTAL TIME TAKING CARE OF THIS PATIENT: 60 minutes.    Christel Mormon M.D on 11/30/2019  at 10:22 PM  [facility]   From 7 PM-7 AM, contact night-coverage [email or web address]  CC: Primary care physician; Maurice Small, MD   Note: This dictation was prepared with Dragon dictation along with smaller phrase technology. Any transcriptional typo errors that result from this process are unintentional.

## 2019-11-30 NOTE — ED Provider Notes (Signed)
[facility] DEPT Provider Note   CSN: [id number] Arrival date & time: 11/30/19  1914     History Chief Complaint  Patient presents with  . Abdominal Pain  . Suicidal    Theresa Stephens is a 69 y.o. 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 1. Reports "mentally not feeling well" for the past month.  Reports suicidal ideation without specific plan.  States that this has worsened since her recent hospitalization.  Was on medications for depression in the past but is not taking anything currently.  Reports worsening anxiety as well.  She denies HI, AVH.  2. Reports abdominal pain and diarrhea for the past week.  Reports nonbloody diarrhea several times a day.  No abdominal pain currently.  No urinary symptoms.  Denies any nausea, vomiting or changes to appetite. 3.  Reports worsening cough in the past 24 hours as well as shortness of breath.  Has had a chronic cough since her radiation therapies but this has worsened.  Had a positive home Covid test about a month ago that did not require hospitalization.  No leg swelling, history of DVT, PE, hemoptysis, fever or chest pain.  HPI     Past Medical History:  Diagnosis Date  . Mouth pain 03/12/2013  . 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 Iron deficiency anemia due to chronic blood loss 08/28/2019  . Acute blood loss anemia 08/28/2019  . UGIB (upper gastrointestinal bleed) 08/26/2019  . 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 Mouth pain 03/12/2013    Past Surgical History:  Procedure Laterality Date    . ESOPHAGOGASTRODUODENOSCOPY N/A 08/26/2019   Procedure: ESOPHAGOGASTRODUODENOSCOPY (EGD);  Surgeon: Ronnette Juniper, MD;  Location: [facility];  Service: Gastroenterology;  Laterality: N/A;  . HEMOSTASIS CLIP PLACEMENT  08/26/2019   Procedure: HEMOSTASIS CLIP PLACEMENT;  Surgeon: Ronnette Juniper, MD;  Location: [facility];  Service: Gastroenterology;;  . HOT HEMOSTASIS N/A 08/26/2019   Procedure: HOT HEMOSTASIS (ARGON PLASMA COAGULATION/BICAP);  Surgeon: Ronnette Juniper, MD;  Location: [facility];  Service: Gastroenterology;  Laterality: N/A;  . SCLEROTHERAPY  08/26/2019   Procedure: SCLEROTHERAPY;  Surgeon: Ronnette Juniper, MD;  Location: [facility];  Service: Gastroenterology;;  . TONGUE BIOPSY    . TONSILLECTOMY       OB History   No obstetric history on file.     Family History  Problem Relation Age of Onset  . Cancer Maternal Uncle        lung ca  . Cancer Daughter 34       breast ca    Social History   Tobacco Use  . Smoking status: Former Smoker    Packs/day: 1.00    Years: 45.00    Pack years: 45.00    Quit date: 03/04/2013    Years since quitting: 6.7  . Smokeless tobacco: Never Used  . Tobacco comment: stopped smoking 02/2013  Substance Use Topics  . Alcohol use: No  . Drug use: No    Frequency: 2.0 times per week    Home Medications Prior to Admission medications   Medication Sig Start Date End  Date Taking? Authorizing Provider  acetaminophen (TYLENOL) 500 MG tablet Take 500 mg by mouth every 6 (six) hours as needed for moderate pain.    [provider]  loratadine (CLARITIN) 5 MG/5ML syrup Take 10 mg by mouth daily as needed for allergies or rhinitis.    [provider]  Nutritional Supplements (FEEDING SUPPLEMENT, OSMOLITE 1.5 CAL,) LIQD Place 237 mLs into feeding tube 5 (five) times daily.    [provider]  pantoprazole (PROTONIX) 40 MG tablet Take 1 tablet (40 mg total) by mouth 2 (two) times daily. 08/29/19   Charlynne Cousins, MD   Water For Irrigation, Sterile (FREE WATER) SOLN Place 200 mLs into feeding tube every 6 (six) hours. 08/29/19   Charlynne Cousins, MD    Allergies    Patient has no known allergies.  Review of Systems   Review of Systems  Constitutional: Negative for appetite change, chills and fever.  HENT: Negative for ear pain, rhinorrhea, sneezing and sore throat.   Eyes: Negative for photophobia and visual disturbance.  Respiratory: Positive for cough and shortness of breath. Negative for chest tightness and wheezing.   Cardiovascular: Negative for chest pain and palpitations.  Gastrointestinal: Positive for abdominal pain and diarrhea. Negative for blood in stool, constipation, nausea and vomiting.  Genitourinary: Negative for dysuria, hematuria and urgency.  Musculoskeletal: Negative for myalgias.  Skin: Negative for rash.  Neurological: Negative for dizziness, weakness and light-headedness.  Psychiatric/Behavioral: Positive for dysphoric mood and suicidal ideas. The patient is nervous/anxious.     Physical Exam Updated Vital Signs BP 122/68   Pulse 84   Temp 97.8 F (36.6 C) (Oral)   Resp (!) 28   SpO2 98%   Physical Exam Vitals and nursing note reviewed.  Constitutional:      General: She is not in acute distress.    Appearance: She is well-developed.     Comments: On 2L of oxygen via nasal cannula.  HENT:     Head: Normocephalic and atraumatic.     Nose: Nose normal.  Eyes:     General: No scleral icterus.       Right eye: No discharge.        Left eye: No discharge.     Conjunctiva/sclera: Conjunctivae normal.  Cardiovascular:     Rate and Rhythm: Normal rate and regular rhythm.     Heart sounds: Normal heart sounds. No murmur heard.  No friction rub. No gallop.   Pulmonary:     Effort: Pulmonary effort is normal. No respiratory distress.     Breath sounds: Examination of the right-lower field reveals rhonchi. Examination of the left-lower field reveals rhonchi.  Rhonchi present.  Abdominal:     General: Bowel sounds are normal. There is no distension.     Palpations: Abdomen is soft.     Tenderness: There is no abdominal tenderness. There is no guarding.  Musculoskeletal:        General: Normal range of motion.     Cervical back: Normal range of motion and neck supple.     Right lower leg: No edema.     Left lower leg: No edema.  Skin:    General: Skin is warm and dry.     Findings: No rash.  Neurological:     Mental Status: She is alert.     Motor: No abnormal muscle tone.     Coordination: Coordination normal.  Psychiatric:        Mood and Affect: Mood is  depressed.        Thought Content: Thought content includes suicidal ideation. Thought content does not include homicidal ideation. Thought content does not include homicidal or suicidal plan.     ED Results / Procedures / Treatments   Labs (all labs ordered are listed, but only abnormal results are displayed) Labs Reviewed  SARS CORONAVIRUS 2 BY RT PCR ([facility]) - Abnormal; Notable for the following components:      Result Value   SARS Coronavirus 2 POSITIVE (*)    All other components within normal limits  COMPREHENSIVE METABOLIC PANEL - Abnormal; Notable for the following components:   Glucose, Bld 105 (*)    Creatinine, Ser 0.42 (*)    AST 62 (*)    ALT 61 (*)    All other components within normal limits  CBC WITH DIFFERENTIAL/PLATELET - Abnormal; Notable for the following components:   Hemoglobin 11.1 (*)    HCT 34.2 (*)    Lymphs Abs 0.6 (*)    All other components within normal limits  URINE CULTURE  CULTURE, BLOOD (ROUTINE X 2)  CULTURE, BLOOD (ROUTINE X 2)  LIPASE, BLOOD  BRAIN NATRIURETIC PEPTIDE  LACTIC ACID, PLASMA  URINALYSIS, ROUTINE W REFLEX MICROSCOPIC  D-DIMER, QUANTITATIVE (NOT AT [facility])  PROCALCITONIN  LACTATE DEHYDROGENASE  FERRITIN  TRIGLYCERIDES  FIBRINOGEN  C-REACTIVE PROTEIN    EKG EKG  Interpretation  Date/Time:  Tuesday November 30 2019 19:55:37 EDT Ventricular Rate:  94 PR Interval:    QRS Duration: 88 QT Interval:  368 QTC Calculation: 461 R Axis:   84 Text Interpretation: Sinus rhythm Atrial premature complex Borderline right axis deviation Low voltage, precordial leads Confirmed by Ripley Fraise [phone]) on 11/30/2019 8:27:09 PM   Radiology CT Angio Chest PE W/Cm &/Or Wo Cm  Result Date: 11/30/2019 CLINICAL DATA:  Chest pain and tachypnea EXAM: CT ANGIOGRAPHY CHEST WITH CONTRAST TECHNIQUE: Multidetector CT imaging of the chest was performed using the standard protocol during bolus administration of intravenous contrast. Multiplanar CT image reconstructions and MIPs were obtained to evaluate the vascular anatomy. CONTRAST:  116mL OMNIPAQUE IOHEXOL 350 MG/ML SOLN COMPARISON:  Chest x-ray from earlier in the same day, CT from 2015. FINDINGS: Cardiovascular: Thoracic aorta demonstrates atherosclerotic calcifications without aneurysmal dilatation or dissection. No cardiac enlargement is noted. The pulmonary artery is well visualized with a normal branching pattern. Evaluation of the lower lobe branches is somewhat limited by patient motion artifact although no definitive filling defect is seen. Mediastinum/Nodes: Thoracic inlet is within normal limits. Hypodense nodule is noted in the right lobe of the thyroid relatively stable from the prior exam of 2015. Esophagus is within normal limits. No sizable hilar or mediastinal adenopathy is noted. A few small right hilar lymph nodes are seen likely reactive in nature. Lungs/Pleura: Lungs are well aerated bilaterally. Patchy ground-glass airspace opacities are identified within the right lung. This is consistent with multifocal pneumonia. Correlate with COVID-19 testing which is pending. Upper Abdomen: Visualized upper abdomen shows no acute abnormality. Musculoskeletal: Hemangioma is noted in the L1 vertebral body. Multilevel vertebral  degenerative changes are seen in the thoracic spine. No other focal abnormality is noted. Review of the MIP images confirms the above findings. IMPRESSION: No definitive pulmonary emboli are seen. Patchy airspace opacity in the right lung consistent with multifocal pneumonia. Correlate with pending COVID-19 testing. Stable hypodense nodule dating back to 2015. This has been biopsied and shown to be benign in etiology. No followup recommended (ref:  J Am Coll Radiol. 2015 Feb;12(2): 143-50). Aortic Atherosclerosis (ICD10-I70.0). Electronically Signed   By: Inez Catalina M.D.   On: 11/30/2019 21:45   DG Chest Portable 1 View  Result Date: 11/30/2019 CLINICAL DATA:  Suicidal ideation, lower abdominal pain, tachypnea, hypoxia EXAM: PORTABLE CHEST 1 VIEW COMPARISON:  08/26/2019 FINDINGS: Single frontal view of the chest demonstrates a stable cardiac silhouette. No airspace disease, effusion, or pneumothorax. Mild central vascular congestion. No acute bony abnormality. IMPRESSION: 1. Mild central vascular congestion without overt edema. Electronically Signed   By: Randa Ngo M.D.   On: 11/30/2019 20:12    Procedures .Critical Care Performed by: Delia Heady, PA-C Authorized by: Delia Heady, PA-C   Critical care provider statement:    Critical care time (minutes):  35   Critical care was necessary to treat or prevent imminent or life-threatening deterioration of the following conditions:  Respiratory failure, sepsis and cardiac failure   Critical care was time spent personally by me on the following activities:  Development of treatment plan with patient or surrogate, discussions with consultants, evaluation of patient's response to treatment, examination of patient, pulse oximetry, re-evaluation of patient's condition, review of old charts, ordering and review of radiographic studies, ordering and review of laboratory studies, ordering and performing treatments and interventions and obtaining history  from patient or surrogate   I assumed direction of critical care for this patient from another provider in my specialty: no     (including critical care time)  Medications Ordered in ED Medications  iohexol (OMNIPAQUE) 350 MG/ML injection 100 mL (100 mLs Intravenous Contrast Given 11/30/19 2124)    ED Course  I have reviewed the triage vital signs and the nursing notes.  Pertinent labs & imaging results that were available during my care of the patient were reviewed by me and considered in my medical decision making (see chart for details).  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 Hypoxic to 87% on room air and placed on 2 L of oxygen via nasal cannula.  No supplemental oxygen use at home.  1.  Reports suicidal ideation without specific plan.  States that her depression and anxiety has worsened since her hospitalization in June.  Denies any HI, AVH.  Was on medications for depression and anxiety in the past but nothing recently.  We will need to medically clear her prior to TTS evaluation.  2. Reports abdominal pain and diarrhea for the past week.  No current abdominal pain, urinary symptoms, vomiting or changes to appetite.  On exam abdomen is soft, nontender nondistended.  She denies any hemoptysis or bloody stools.  3. Reports worsening cough and shortness of breath in the past 24 hours.  Has had a positive home Covid test about a month ago that did not require hospitalization.  Denies any leg swelling, history of  DVT, PE, hemoptysis.  Due to her recent Covid infection  and hypoxia, will need to evaluate for PE.  EKG shows sinus rhythm, no ischemic changes.  Chest x-ray shows mild vascular congestion without signs of edema or infiltrate. No leukocytosis, normal lactic acid level.  CT angio of the chest shows no evidence of PE but does show findings consistent with Covid multifocal pneumonia.  Covid test is positive here.  She has not been vaccinated against Covid. Patient will need to be admitted for management of her Covid infection causing hypoxia.  Suspect that her diarrhea is due to this as well.  Patient is agreeable to the plan.  I have ordered Covid inflammatory markers and remdesivir per pharmacy consult.  Will admit to hospitalist service.   Portions of this note were generated with [employer]. Dictation errors may occur despite best attempts at proofreading.   Final Clinical Impression(s) / ED Diagnoses Final diagnoses:  Suicidal ideation  Hypoxia  COVID-19 virus infection    Rx / DC Orders ED Discharge Orders    None       Delia Heady, Hershal Coria 11/30/19 2156    Ripley Fraise, MD 11/30/19 2328

## 2019-11-30 NOTE — ED Triage Notes (Signed)
Pt states she has not been feeling mentally well for ~ 1 month. Endorses SI with no plan. Also endorsing lower abdominal pain. Last BM today. Pt tachypneic in triage and found to be 87% on RA. Pt now 94% on 2L [place].

## 2019-12-01 ENCOUNTER — Encounter (HOSPITAL_COMMUNITY): Payer: Self-pay | Admitting: Family Medicine

## 2019-12-01 DIAGNOSIS — J1282 Pneumonia due to coronavirus disease 2019: Secondary | ICD-10-CM

## 2019-12-01 DIAGNOSIS — F32A Depression, unspecified: Secondary | ICD-10-CM

## 2019-12-01 DIAGNOSIS — R1310 Dysphagia, unspecified: Secondary | ICD-10-CM

## 2019-12-01 DIAGNOSIS — C069 Malignant neoplasm of mouth, unspecified: Secondary | ICD-10-CM

## 2019-12-01 DIAGNOSIS — K219 Gastro-esophageal reflux disease without esophagitis: Secondary | ICD-10-CM

## 2019-12-01 LAB — COMPREHENSIVE METABOLIC PANEL
ALT: 53 U/L — ABNORMAL HIGH (ref 0–44)
AST: 45 U/L — ABNORMAL HIGH (ref 15–41)
Albumin: 3.3 g/dL — ABNORMAL LOW (ref 3.5–5.0)
Alkaline Phosphatase: 104 U/L (ref 38–126)
Anion gap: 9 (ref 5–15)
BUN: 13 mg/dL (ref 8–23)
CO2: 24 mmol/L (ref 22–32)
Calcium: 8.4 mg/dL — ABNORMAL LOW (ref 8.9–10.3)
Chloride: 102 mmol/L (ref 98–111)
Creatinine, Ser: 0.37 mg/dL — ABNORMAL LOW (ref 0.44–1.00)
GFR calc Af Amer: >60 mL/min (ref >60)
GFR calc non Af Amer: >60 mL/min (ref >60)
Glucose, Bld: 155 mg/dL — ABNORMAL HIGH (ref 70–99)
Potassium: 4.2 mmol/L (ref 3.5–5.1)
Sodium: 135 mmol/L (ref 135–145)
Total Bilirubin: 0.5 mg/dL (ref 0.3–1.2)
Total Protein: 6.7 g/dL (ref 6.5–8.1)

## 2019-12-01 LAB — C-REACTIVE PROTEIN: CRP: 1.7 mg/dL — ABNORMAL HIGH (ref <1.0)

## 2019-12-01 LAB — CBC
HCT: 34.7 % — ABNORMAL LOW (ref 36.0–46.0)
Hemoglobin: 10.8 g/dL — ABNORMAL LOW (ref 12.0–15.0)
MCH: 26.5 pg (ref 26.0–34.0)
MCHC: 31.1 g/dL (ref 30.0–36.0)
MCV: 85 fL (ref 80.0–100.0)
Platelets: 348 10*3/uL (ref 150–400)
RBC: 4.08 MIL/uL (ref 3.87–5.11)
RDW: 14.6 % (ref 11.5–15.5)
WBC: 4.4 10*3/uL (ref 4.0–10.5)
nRBC: 0 % (ref 0.0–0.2)

## 2019-12-01 LAB — CBC WITH DIFFERENTIAL/PLATELET
Abs Immature Granulocytes: 0.03 10*3/uL (ref 0.00–0.07)
Basophils Absolute: 0 10*3/uL (ref 0.0–0.1)
Basophils Relative: 0 %
Eosinophils Absolute: 0 10*3/uL (ref 0.0–0.5)
Eosinophils Relative: 0 %
HCT: 33.9 % — ABNORMAL LOW (ref 36.0–46.0)
Hemoglobin: 10.7 g/dL — ABNORMAL LOW (ref 12.0–15.0)
Immature Granulocytes: 1 %
Lymphocytes Relative: 8 %
Lymphs Abs: 0.3 10*3/uL — ABNORMAL LOW (ref 0.7–4.0)
MCH: 26.6 pg (ref 26.0–34.0)
MCHC: 31.6 g/dL (ref 30.0–36.0)
MCV: 84.1 fL (ref 80.0–100.0)
Monocytes Absolute: 0.1 10*3/uL (ref 0.1–1.0)
Monocytes Relative: 3 %
Neutro Abs: 2.8 10*3/uL (ref 1.7–7.7)
Neutrophils Relative %: 88 %
Platelets: 346 10*3/uL (ref 150–400)
RBC: 4.03 MIL/uL (ref 3.87–5.11)
RDW: 14.6 % (ref 11.5–15.5)
WBC: 3.2 10*3/uL — ABNORMAL LOW (ref 4.0–10.5)
nRBC: 0 % (ref 0.0–0.2)

## 2019-12-01 LAB — D-DIMER, QUANTITATIVE: D-Dimer, Quant: 0.86 ug/mL-FEU — ABNORMAL HIGH (ref 0.00–0.50)

## 2019-12-01 LAB — URINE CULTURE: Culture: <10000 — AB

## 2019-12-01 LAB — FERRITIN: Ferritin: 57 ng/mL (ref 11–307)

## 2019-12-01 MED ORDER — FLUOXETINE HCL 20 MG/5ML PO SOLN
30.0000 mg | Freq: Every day | ORAL | Status: DC
  Administered 2019-12-01 – 2019-12-07 (×7): 30 mg via ORAL
  Filled 2019-12-01 (×7): qty 10

## 2019-12-01 MED ORDER — HYDROCOD POLST-CPM POLST ER 10-8 MG/5ML PO SUER
5.0000 mL | Freq: Two times a day (BID) | ORAL | Status: DC
  Administered 2019-12-01 – 2019-12-07 (×12): 5 mL via ORAL
  Filled 2019-12-01 (×12): qty 5

## 2019-12-01 MED ORDER — FLUOXETINE HCL 10 MG PO CAPS
10.0000 mg | ORAL_CAPSULE | Freq: Every day | ORAL | Status: DC
  Filled 2019-12-01: qty 1

## 2019-12-01 MED ORDER — GUAIFENESIN 100 MG/5ML PO SOLN
15.0000 mL | Freq: Four times a day (QID) | ORAL | Status: DC
  Administered 2019-12-01 (×3): 300 mg via ORAL
  Filled 2019-12-01 (×3): qty 10

## 2019-12-01 MED ORDER — STERILE WATER FOR IRRIGATION IR SOLN
Freq: Four times a day (QID) | Status: DC
  Administered 2019-12-02: 500 mL
  Administered 2019-12-03 – 2019-12-05 (×8): 200 mL
  Administered 2019-12-05: 500 mL
  Administered 2019-12-05 – 2019-12-06 (×3): 200 mL
  Administered 2019-12-06 – 2019-12-07 (×4): 500 mL

## 2019-12-01 MED ORDER — METHYLPREDNISOLONE SODIUM SUCC 40 MG IJ SOLR
40.0000 mg | Freq: Two times a day (BID) | INTRAMUSCULAR | Status: DC
  Administered 2019-12-02 (×2): 40 mg via INTRAVENOUS
  Filled 2019-12-01 (×2): qty 1

## 2019-12-01 MED ORDER — PANTOPRAZOLE SODIUM 40 MG PO PACK
40.0000 mg | PACK | Freq: Two times a day (BID) | ORAL | Status: DC
  Administered 2019-12-01 – 2019-12-07 (×14): 40 mg
  Filled 2019-12-01 (×15): qty 20

## 2019-12-01 MED ORDER — OSMOLITE 1.5 CAL PO LIQD
355.0000 mL | Freq: Every day | ORAL | Status: DC
  Administered 2019-12-01 – 2019-12-02 (×7): 355 mL
  Filled 2019-12-01 (×11): qty 474

## 2019-12-01 MED ORDER — FLUOXETINE HCL 20 MG PO CAPS: 20.0000 mg | ORAL_CAPSULE | Freq: Every day | ORAL | Status: DC

## 2019-12-01 MED ORDER — OSMOLITE 1.5 CAL PO LIQD: 1000.0000 mL | ORAL | Status: DC

## 2019-12-01 NOTE — Consult Note (Addendum)
Patient is admitted to the hospitalist service for COVID-19.  Psych consult has been placed for depression and suicidal ideations.  Patient with history of old squamous cell carcinoma, and has poor speech, however patient is able to proceed with the evaluation.  She did require multiple repetition of questions.  Patient was able to verbalize chronic depression, and endorses ongoing suicidal thoughts secondary to her depression.  She reports a history of depression in which she took Prozac for quite length of time.  She denies any previous suicide attempts, however feels that there is some hope.  She states" I know I needed to get help because that she can feel this way."  She is open to resuming her antidepressant medication.  She is also able to contract for safety while in the hospital. -We will start fluoxetine 10 mg p.o. daily and increase as needed to 20 mg for management of depression. -Will suggest working closely with social work for referral to geriatric psychiatrist for management of depression. ([facility], [facility]) -Will psych clear at this time. -As noted above patient is able to contract for safety while in the hospital, she is also able to verbalize her depression and suicidal thoughts.  - Will suggest one-to-one [employer]. -At this time she meets inpatient criteria for admission, once medically cleared.

## 2019-12-01 NOTE — Progress Notes (Addendum)
Pt arrived to unit via stretcher from ED, on 2L of O2/[place]. A&O x4 and able to make needs none with gestures and difficulty talking.

## 2019-12-01 NOTE — Progress Notes (Signed)
   12/01/19 0123  Assess: MEWS Score  Temp 97.9 F (36.6 C)  BP (!) 141/77  Pulse Rate 84  Resp 18  SpO2 99 %  O2 Device Nasal Cannula  O2 Flow Rate (L/min) 2 L/min  Assess: MEWS Score  MEWS Temp 0  MEWS Systolic 0  MEWS Pulse 0  MEWS RR 0  MEWS LOC 0  MEWS Score 0  MEWS Score Color Green  Assess: if the MEWS score is Yellow or Red  Were vital signs taken at a resting state? Yes  Focused Assessment No change from prior assessment  Early Detection of Sepsis Score *See Row Information* Low  MEWS guidelines implemented *See Row Information* No, previously yellow, continue vital signs every 4 hours  Treat  Pain Scale 0-10  Pain Score 0  Take Vital Signs  Increase Vital Sign Frequency   (continue yellow mews guideline with vs)  Escalate  MEWS: Escalate  (continue yellow mews guideline with vs)  Document  Patient Outcome Stabilized after interventions  Progress note created (see row info) Yes   Pt is now green MEWS. VS are within normal baseline. Pt is alert w/no signs and symptoms of distress. Will continue to monitor the patient.

## 2019-12-01 NOTE — Progress Notes (Signed)
   12/01/19 2023  Assess: MEWS Score  Temp 97.7 F (36.5 C)  BP 128/90  Pulse Rate 89  Resp (!) 28  Level of Consciousness Alert  SpO2 96 %  O2 Device Nasal Cannula  O2 Flow Rate (L/min) 2 L/min  Assess: MEWS Score  MEWS Temp 0  MEWS Systolic 0  MEWS Pulse 0  MEWS RR 2  MEWS LOC 0  MEWS Score 2  MEWS Score Color Yellow  Assess: if the MEWS score is Yellow or Red  Were vital signs taken at a resting state? Yes  Focused Assessment No change from prior assessment  Early Detection of Sepsis Score *See Row Information* Low  MEWS guidelines implemented *See Row Information* Yes  Treat  Pain Scale 0-10  Pain Score 0  Take Vital Signs  Increase Vital Sign Frequency  Yellow: Q 2hr X 2 then Q 4hr X 2, if remains yellow, continue Q 4hrs  Escalate  MEWS: Escalate Yellow: discuss with charge nurse/RN and consider discussing with provider and RRT (spoke with Glenford Peers Nurse)  Notify: Charge Nurse/RN  Name of Charge Nurse/RN Notified Kathyrn Lass  Date Charge Nurse/RN Notified 12/01/19  Time Charge Nurse/RN Notified 2030  Document  Patient Outcome Other (Comment) (remain on unit with no new changes)  Progress note created (see row info) Yes   Pt in Yellow MEWS for resp of 28. Pt is having no new SOB, alert, and able to make needs known. Pt is ambulating with assistance to bathroom w/no new signs and symptoms of distress. Tom, charge nurse notified, continue to monitor the patient.

## 2019-12-01 NOTE — Progress Notes (Signed)
PROGRESS NOTE    Theresa Stephens  JEH:[id number] DOB: Jul 18, 1950 DOA: 11/30/2019 PCP: Maurice Small, MD    Brief Narrative:  Acute hypoxic respiratory failure due to SARS COVID-19 viral pneumonia.  69 year old female with past medical history for oral cavities, cell carcinoma and GI bleed, who presented with dyspnea and cough over the last 2 days.  Positive aches and pains along with generalized weakness.  On her initial physical examination her oximetry was 87% on room air, blood pressure 134/77, heart rate 88, temperature 97.8, respiratory rate 22.  She had dyspnea at rest, decreased breath sounds at the right base with positive Rales, heart S1-S2, present rhythmic, soft abdomen, no lower extremity edema. SARS COVID-19 positive. Her chest radiograph had been very faint interstitial infiltrate at the left upper lobe and bilateral bases.    Assessment & Plan:   Active Problems:   Acute hypoxemic respiratory failure due to COVID-19 ([place])   1.  Acute hypoxic respiratory failure due to SARS COVID-19 viral pneumonia.  RR: 17  Pulse oxymetry: 97%  Fi02: 2 L/min per [place]  COVID-19 Labs  Recent Labs    11/30/19 2153 12/01/19 0412  DDIMER 0.92* 0.86*  FERRITIN 66 57  LDH 194*  --   CRP 2.0* 1.7*    Lab Results  Component Value Date   SARSCOV2NAA POSITIVE (A) 11/30/2019   [place] NEGATIVE 08/26/2019    Inflammatory markers trending down, patient complains of oral mucous, feedings per peg tube.   Continue medical therapy with baricitinib #1, Remdesivir #2, and methylprednisolone (decrease dose to 40 mg IV q 12). Continue airway clearing techniques, antitussive agents and bronchodilator therapy. Oral suction as needed.   Discontinue IV fluids, follow a restrictive IV fluids strategy.   2. Sp oral cancer/ sp peg tube. Continue tube feedings and oral suction as needed.  3. GERD. Continue with antiacid therapy.  4. Depression with suicidal thoughts. Continue one to one  observation. Will consult psych for further evaluation. Continue with trazodone.   Patient continue to be at high risk for worsening respiratory failure   Status is: Inpatient  Remains inpatient appropriate because:IV treatments appropriate due to intensity of illness or inability to take PO   Dispo: The patient is from: Home              Anticipated d/c is to: Home              Anticipated d/c date is: > 3 days              Patient currently is not medically stable to d/c.   DVT prophylaxis: Enoxaparin   Code Status:   full  Family Communication:  No family at the bedside     Consultants:   Psychiatry     Subjective: Patient is not feeling well, complains of mucous in her mouth, difficult to clear. Has a one to one sitter in place. Dyspnea stable with no chest pain.   Objective: Vitals:   12/01/19 0049 12/01/19 0123 12/01/19 0327 12/01/19 0736  BP:  (!) 141/77 117/66 121/76  Pulse:  84 76 85  Resp:  18 (!) 24 17  Temp:  97.9 F (36.6 C) 97.6 F (36.4 C) 97.9 F (36.6 C)  TempSrc:   Axillary Axillary  SpO2:  99% 98% 97%  Weight: 59.2 kg     Height: 4\' 11"  (1.499 m)       Intake/Output Summary (Last 24 hours) at 12/01/2019 1107 Last data filed at 12/01/[phone] [id number]  Gross per 24 hour  Intake 284.33 ml  Output 150 ml  Net 134.33 ml   Filed Weights   12/01/19 0049  Weight: 59.2 kg    Examination:   General: deconditioned and ill looking appearing  Neurology: Awake and alert, non focal  E ENT: mild pallor, no icterus. Cardiovascular: No JVD. S1-S2 present, rhythmic, no gallops, rubs, or murmurs. No lower extremity edema. Pulmonary: positive breath sounds bilaterally, no wheezing.  Gastrointestinal. Abdomen soft and non tender, peg tube in place.  Skin. No rashes Musculoskeletal: no joint deformities     Data Reviewed: I have personally reviewed following labs and imaging studies  CBC: Recent Labs  Lab 11/30/19 2028 11/30/19 2336 12/01/19 0412  WBC  4.0 4.4 3.2*  NEUTROABS 2.9  --  2.8  HGB 11.1* 10.8* 10.7*  HCT 34.2* 34.7* 33.9*  MCV 82.8 85.0 84.1  PLT 365 348 481   Basic Metabolic Panel: Recent Labs  Lab 11/30/19 2028 12/01/19 0412  NA 139 135  K 4.2 4.2  CL 100 102  CO2 27 24  GLUCOSE 105* 155*  BUN 16 13  CREATININE 0.42* 0.37*  CALCIUM 9.2 8.4*   GFR: Estimated Creatinine Clearance: 51.2 mL/min (A) (by C-G formula based on SCr of 0.37 mg/dL (L)). Liver Function Tests: Recent Labs  Lab 11/30/19 2028 12/01/19 0412  AST 62* 45*  ALT 61* 53*  ALKPHOS 105 104  BILITOT 0.3 0.5  PROT 7.1 6.7  ALBUMIN 3.5 3.3*   Recent Labs  Lab 11/30/19 2028  LIPASE 39   No results for input(s): AMMONIA in the last 168 hours. Coagulation Profile: No results for input(s): INR, PROTIME in the last 168 hours. Cardiac Enzymes: No results for input(s): CKTOTAL, CKMB, CKMBINDEX, TROPONINI in the last 168 hours. BNP (last 3 results) No results for input(s): PROBNP in the last 8760 hours. HbA1C: No results for input(s): HGBA1C in the last 72 hours. CBG: No results for input(s): GLUCAP in the last 168 hours. Lipid Profile: Recent Labs    11/30/19 2153  TRIG 112   Thyroid Function Tests: No results for input(s): TSH, T4TOTAL, FREET4, T3FREE, THYROIDAB in the last 72 hours. Anemia Panel: Recent Labs    11/30/19 2153 12/01/19 0412  FERRITIN 66 18      Radiology Studies: I have reviewed all of the imaging during this hospital visit personally     Scheduled Meds: . vitamin C  500 mg Oral Daily  . aspirin EC  81 mg Oral Daily  . baricitinib  4 mg Per Tube Daily  . cholecalciferol  1,000 Units Oral Daily  . enoxaparin (LOVENOX) injection  40 mg Subcutaneous QHS  . famotidine  20 mg Oral BID  . feeding supplement (OSMOLITE 1.5 CAL)  355 mL Per Tube 5 X Daily  . guaiFENesin  15 mL Oral Q6H  . methylPREDNISolone (SOLU-MEDROL) injection  60 mg Intravenous Q12H   Followed by  . [START ON 12/04/2019] predniSONE  50  mg Oral Daily  . pantoprazole sodium  40 mg Per Tube BID  . sterile water for irrigation   Irrigation Q6H  . zinc sulfate  220 mg Oral Daily   Continuous Infusions: . sodium chloride 100 mL/hr at 12/01/19 0017  . remdesivir 100 mg in NS 100 mL       LOS: 1 day        Markes Shatswell Gerome Apley, MD

## 2019-12-01 NOTE — Progress Notes (Signed)
   11/30/19 2329  Assess: MEWS Score  Temp 98 F (36.7 C)  BP 133/83  Pulse Rate 91  Resp (!) 27  SpO2 99 %  O2 Device Nasal Cannula  Assess: MEWS Score  MEWS Temp 0  MEWS Systolic 0  MEWS Pulse 0  MEWS RR 2  MEWS LOC 0  MEWS Score 2  MEWS Score Color Yellow  Assess: if the MEWS score is Yellow or Red  Were vital signs taken at a resting state? Yes  Focused Assessment Change from prior assessment (see assessment flowsheet)  Early Detection of Sepsis Score *See Row Information* Low  MEWS guidelines implemented *See Row Information* Yes  Treat  MEWS Interventions Administered scheduled meds/treatments;Administered prn meds/treatments  Take Vital Signs  Increase Vital Sign Frequency  Yellow: Q 2hr X 2 then Q 4hr X 2, if remains yellow, continue Q 4hrs  Escalate  MEWS: Escalate Yellow: discuss with charge nurse/RN and consider discussing with provider and RRT  Notify: Charge Nurse/RN  Name of Charge Nurse/RN Notified Kathyrn Lass  Date Charge Nurse/RN Notified 11/30/19  Time Charge Nurse/RN Notified 2331  Notify: Provider  Provider Name/Title Sharlet Salina  Date Provider Notified 11/30/19  Time Provider Notified 2333  Notification Type Page  Notification Reason Change in status  Response No new orders  Date of Provider Response 11/30/19 (no response)  Time of Provider Response  (no response)  Document  Patient Outcome Stabilized after interventions  Progress note created (see row info) Yes

## 2019-12-01 NOTE — Progress Notes (Signed)
   12/01/19 0327  Assess: MEWS Score  Temp 97.6 F (36.4 C)  BP 117/66  Pulse Rate 76  SpO2 98 %  O2 Device Nasal Cannula  O2 Flow Rate (L/min) 2 L/min  Assess: MEWS Score  MEWS Temp 0  MEWS Systolic 0  MEWS Pulse 0  MEWS RR 0  MEWS LOC 0  MEWS Score 0  MEWS Score Color Green  Assess: if the MEWS score is Yellow or Red  Were vital signs taken at a resting state? Yes  Focused Assessment No change from prior assessment  Early Detection of Sepsis Score *See Row Information* Low  MEWS guidelines implemented *See Row Information* No, previously yellow, continue vital signs every 4 hours  Document  Patient Outcome Stabilized after interventions  Progress note created (see row info) Yes   Pt in green MEWS and VS are within baseline. No signs and symptoms of distress noted. Will continue to monitor the patient.

## 2019-12-02 LAB — COMPREHENSIVE METABOLIC PANEL
ALT: 49 U/L — ABNORMAL HIGH (ref 0–44)
AST: 37 U/L (ref 15–41)
Albumin: 3.2 g/dL — ABNORMAL LOW (ref 3.5–5.0)
Alkaline Phosphatase: 114 U/L (ref 38–126)
Anion gap: 10 (ref 5–15)
BUN: 20 mg/dL (ref 8–23)
CO2: 26 mmol/L (ref 22–32)
Calcium: 8.6 mg/dL — ABNORMAL LOW (ref 8.9–10.3)
Chloride: 102 mmol/L (ref 98–111)
Creatinine, Ser: 0.44 mg/dL (ref 0.44–1.00)
GFR calc Af Amer: >60 mL/min (ref >60)
GFR calc non Af Amer: >60 mL/min (ref >60)
Glucose, Bld: 154 mg/dL — ABNORMAL HIGH (ref 70–99)
Potassium: 4.4 mmol/L (ref 3.5–5.1)
Sodium: 138 mmol/L (ref 135–145)
Total Bilirubin: 0.6 mg/dL (ref 0.3–1.2)
Total Protein: 6.4 g/dL — ABNORMAL LOW (ref 6.5–8.1)

## 2019-12-02 LAB — C-REACTIVE PROTEIN: CRP: 0.8 mg/dL (ref <1.0)

## 2019-12-02 LAB — D-DIMER, QUANTITATIVE: D-Dimer, Quant: 0.87 ug/mL-FEU — ABNORMAL HIGH (ref 0.00–0.50)

## 2019-12-02 LAB — FERRITIN: Ferritin: 45 ng/mL (ref 11–307)

## 2019-12-02 MED ORDER — OSMOLITE 1.5 CAL PO LIQD
355.0000 mL | Freq: Three times a day (TID) | ORAL | Status: DC
  Administered 2019-12-02 – 2019-12-07 (×11): 355 mL
  Filled 2019-12-02 (×16): qty 474

## 2019-12-02 MED ORDER — METHYLPREDNISOLONE SODIUM SUCC 40 MG IJ SOLR
40.0000 mg | Freq: Every day | INTRAMUSCULAR | Status: DC
  Administered 2019-12-03 – 2019-12-07 (×5): 40 mg via INTRAVENOUS
  Filled 2019-12-02 (×6): qty 1

## 2019-12-02 NOTE — Progress Notes (Signed)
PROGRESS NOTE    Theresa Stephens  OYD:[id number] DOB: 10/19/1950 DOA: 11/30/2019 PCP: Maurice Small, MD    Brief Narrative:  Acute hypoxic respiratory failure due to SARS COVID-19 viral pneumonia.  69 year old female with past medical history for oral cavities, cell carcinoma and GI bleed, who presented with dyspnea and cough over the last 2 days.  Positive aches and pains along with generalized weakness.  On her initial physical examination her oximetry was 87% on room air, blood pressure 134/77, heart rate 88, temperature 97.8, respiratory rate 22.  She had dyspnea at rest, decreased breath sounds at the right base with positive Rales, heart S1-S2, present rhythmic, soft abdomen, no lower extremity edema. SARS COVID-19 positive. Her chest radiograph had been very faint interstitial infiltrate at the left upper lobe and bilateral bases.   Assessment & Plan:   Principal Problem:   Pneumonia due to COVID-19 virus Active Problems:   Acute hypoxemic respiratory failure due to COVID-19 [facility])   GERD (gastroesophageal reflux disease)   Swallowing dysfunction   Oral cancer (HCC)   Depression   1.  Acute hypoxic respiratory failure due to SARS COVID-19 viral pneumonia.  RR: 20  Pulse oxymetry: 95%  Fi02: 2 L/min per [place]   COVID-19 Labs  Recent Labs    11/30/19 2153 12/01/19 0412 12/02/19 0431  DDIMER 0.92* 0.86* 0.87*  FERRITIN 66 57 45  LDH 194*  --   --   CRP 2.0* 1.7* 0.8    Lab Results  Component Value Date   SARSCOV2NAA POSITIVE (A) 11/30/2019   [place] NEGATIVE 08/26/2019     Continue inflammatory markers trending down.   On baricitinib, Remdesivir #3/5, and methylprednisolone with good toleration. Will decrease methylprednisolone to 40 mg daily for now. Airway clearing techniques, antitussive agents and bronchodilator therapy. Oral secretions have improved today.    2. Sp oral cancer/ sp peg tube. On tube feedings with good toleration.  3. GERD.  Continue ppi.   4. Depression with suicidal thoughts. Patient has been placed on prozac and will need inpatient psych when medically stable.  Continue with one to one suicidal precautions and qhs trazodone.    Status is: Inpatient  Remains inpatient appropriate because:IV treatments appropriate due to intensity of illness or inability to take PO   Dispo: The patient is from: Home              Anticipated d/c is to: Inpatient psych               Anticipated d/c date is: > 3 days              Patient currently is not medically stable to d/c.   DVT prophylaxis: Enoxaparin   Code Status:   full  Family Communication:  I spoke over the phone with the patient's daughter about patient's  condition, plan of care, prognosis and all questions were addressed.    Consultants:   Psychiatry     Subjective: Patient continue to be very weak and deconditioned, not back to baseline, no nausea or vomiting.   Objective: Vitals:   12/01/19 2220 12/02/19 0023 12/02/19 0419 12/02/19 0823  BP: 117/71 112/71 109/76 120/71  Pulse: 88 84 75 90  Resp: (!) 27 (!) 23 (!) 23 20  Temp: 98 F (36.7 C) 97.8 F (36.6 C) 97.7 F (36.5 C) 97.7 F (36.5 C)  TempSrc: Axillary Axillary Axillary Axillary  SpO2: 93% 96% 95% 95%  Weight:      Height:  Intake/Output Summary (Last 24 hours) at 12/02/2019 0832 Last data filed at 12/01/2019 1649 Gross per 24 hour  Intake --  Output 550 ml  Net -550 ml   Filed Weights   12/01/19 0049  Weight: 59.2 kg    Examination:   General: Not in pain or dyspnea. Deconditioned  Neurology: Awake and alert, non focal  E ENT: positive pallor, no icterus, oral mucosa moist Cardiovascular: No JVD. S1-S2 present, rhythmic, no gallops, rubs, or murmurs. No lower extremity edema. Pulmonary: Positive breath sounds bilaterally, decreased air movement,  Gastrointestinal. Abdomen soft and non tender Skin. No rashes Musculoskeletal: no joint  deformities     Data Reviewed: I have personally reviewed following labs and imaging studies  CBC: Recent Labs  Lab 11/30/19 2028 11/30/19 2336 12/01/19 0412  WBC 4.0 4.4 3.2*  NEUTROABS 2.9  --  2.8  HGB 11.1* 10.8* 10.7*  HCT 34.2* 34.7* 33.9*  MCV 82.8 85.0 84.1  PLT 365 348 656   Basic Metabolic Panel: Recent Labs  Lab 11/30/19 2028 12/01/19 0412 12/02/19 0431  NA 139 135 138  K 4.2 4.2 4.4  CL 100 102 102  CO2 27 24 26   GLUCOSE 105* 155* 154*  BUN 16 13 20   CREATININE 0.42* 0.37* 0.44  CALCIUM 9.2 8.4* 8.6*   GFR: Estimated Creatinine Clearance: 51.2 mL/min (by C-G formula based on SCr of 0.44 mg/dL). Liver Function Tests: Recent Labs  Lab 11/30/19 2028 12/01/19 0412 12/02/19 0431  AST 62* 45* 37  ALT 61* 53* 49*  ALKPHOS 105 104 114  BILITOT 0.3 0.5 0.6  PROT 7.1 6.7 6.4*  ALBUMIN 3.5 3.3* 3.2*   Recent Labs  Lab 11/30/19 2028  LIPASE 39   No results for input(s): AMMONIA in the last 168 hours. Coagulation Profile: No results for input(s): INR, PROTIME in the last 168 hours. Cardiac Enzymes: No results for input(s): CKTOTAL, CKMB, CKMBINDEX, TROPONINI in the last 168 hours. BNP (last 3 results) No results for input(s): PROBNP in the last 8760 hours. HbA1C: No results for input(s): HGBA1C in the last 72 hours. CBG: No results for input(s): GLUCAP in the last 168 hours. Lipid Profile: Recent Labs    11/30/19 2153  TRIG 112   Thyroid Function Tests: No results for input(s): TSH, T4TOTAL, FREET4, T3FREE, THYROIDAB in the last 72 hours. Anemia Panel: Recent Labs    12/01/19 0412 12/02/19 0431  FERRITIN 57 24      Radiology Studies: I have reviewed all of the imaging during this hospital visit personally     Scheduled Meds: . vitamin C  500 mg Oral Daily  . aspirin EC  81 mg Oral Daily  . baricitinib  4 mg Per Tube Daily  . chlorpheniramine-HYDROcodone  5 mL Oral Q12H  . cholecalciferol  1,000 Units Oral Daily  .  enoxaparin (LOVENOX) injection  40 mg Subcutaneous QHS  . famotidine  20 mg Oral BID  . feeding supplement (OSMOLITE 1.5 CAL)  355 mL Per Tube 5 X Daily  . FLUoxetine  30 mg Oral Daily  . methylPREDNISolone (SOLU-MEDROL) injection  40 mg Intravenous Q12H  . pantoprazole sodium  40 mg Per Tube BID  . sterile water for irrigation   Irrigation Q6H  . zinc sulfate  220 mg Oral Daily   Continuous Infusions: . remdesivir 100 mg in NS 100 mL 100 mg (12/01/19 1109)     LOS: 2 days        Theresa Stephens Gerome Apley, MD

## 2019-12-02 NOTE — Progress Notes (Signed)
   12/02/19 0023  Assess: MEWS Score  Temp 97.8 F (36.6 C)  BP 112/71  Pulse Rate 84  Resp (!) 23  SpO2 96 %  O2 Device Nasal Cannula  O2 Flow Rate (L/min) 2 L/min  Assess: MEWS Score  MEWS Temp 0  MEWS Systolic 0  MEWS Pulse 0  MEWS RR 1  MEWS LOC 0  MEWS Score 1  MEWS Score Color Green  Assess: if the MEWS score is Yellow or Red  Were vital signs taken at a resting state? Yes  Focused Assessment No change from prior assessment  Early Detection of Sepsis Score *See Row Information* Low  MEWS guidelines implemented *See Row Information* No, previously yellow, continue vital signs every 4 hours  Treat  Pain Scale 0-10  Pain Score 0  Take Vital Signs  Increase Vital Sign Frequency   (VS to continue Q 4 hours)  Escalate  MEWS: Escalate  (VS to continue Q 4 hours)  Document  Patient Outcome Stabilized after interventions  Progress note created (see row info) Yes   Pt is now green mews with resp at 23. Pt VS within patients baseline. Will continue to monitor the patient.

## 2019-12-02 NOTE — Progress Notes (Signed)
   12/01/19 2220  Assess: MEWS Score  Temp 98 F (36.7 C)  BP 117/71  Pulse Rate 88  Resp (!) 27  Level of Consciousness Alert  SpO2 93 %  O2 Device Nasal Cannula  O2 Flow Rate (L/min) 2 L/min  Assess: MEWS Score  MEWS Temp 0  MEWS Systolic 0  MEWS Pulse 0  MEWS RR 2  MEWS LOC 0  MEWS Score 2  MEWS Score Color Yellow  Assess: if the MEWS score is Yellow or Red  Were vital signs taken at a resting state? Yes  Focused Assessment No change from prior assessment  Early Detection of Sepsis Score *See Row Information* Low  MEWS guidelines implemented *See Row Information* No, previously yellow, continue vital signs every 4 hours  Treat  Pain Scale 0-10  Pain Score 0  Take Vital Signs  Increase Vital Sign Frequency   (continue yellow mews guidelines)  Escalate  MEWS: Escalate  (continue yellow mews guidelines)  Notify: Charge Nurse/RN  Name of Charge Nurse/RN Notified Kathyrn Lass  Date Charge Nurse/RN Notified 12/01/19  Time Charge Nurse/RN Notified 2300  Notify: Provider  Provider Name/Title Sharlet Salina  Date Provider Notified 12/02/19  Time Provider Notified 0036  Notification Type Page  Notification Reason Change in status  Response No new orders  Date of Provider Response  (no response)  Time of Provider Response  (no response)  Document  Patient Outcome Stabilized after interventions  Progress note created (see row info) Yes   Pt is still yellow mews due to resp of 27. No signs or symptoms of distress. Pt ambulated to bathroom with assistance and able to make needs known. [employer] operator notified. On-call attending notified by AMION no new orders. Will continue to monitor the patient.

## 2019-12-02 NOTE — Progress Notes (Addendum)
Patient expressed she only wants to do three tube feedings a day as she does at home. Updated MD. New orders were placed for her to do tube feedings 3x a day.

## 2019-12-02 NOTE — Progress Notes (Signed)
   12/02/19 0419  Assess: MEWS Score  Temp 97.7 F (36.5 C)  BP 109/76  Pulse Rate 75  Resp (!) 23  SpO2 95 %  O2 Device Nasal Cannula  O2 Flow Rate (L/min) 2 L/min  Assess: MEWS Score  MEWS Temp 0  MEWS Systolic 0  MEWS Pulse 0  MEWS RR 1  MEWS LOC 0  MEWS Score 1  MEWS Score Color Green  Assess: if the MEWS score is Yellow or Red  Were vital signs taken at a resting state? Yes  Focused Assessment No change from prior assessment  Early Detection of Sepsis Score *See Row Information* Low  MEWS guidelines implemented *See Row Information* No, previously yellow, continue vital signs every 4 hours  Document  Progress note created (see row info) Yes   Pt remain in green mews with no new signs and symptoms of distress. Will continue to monitor the patient.

## 2019-12-03 LAB — COMPREHENSIVE METABOLIC PANEL
ALT: 114 U/L — ABNORMAL HIGH (ref 0–44)
AST: 99 U/L — ABNORMAL HIGH (ref 15–41)
Albumin: 3 g/dL — ABNORMAL LOW (ref 3.5–5.0)
Alkaline Phosphatase: 119 U/L (ref 38–126)
Anion gap: 8 (ref 5–15)
BUN: 26 mg/dL — ABNORMAL HIGH (ref 8–23)
CO2: 27 mmol/L (ref 22–32)
Calcium: 8.4 mg/dL — ABNORMAL LOW (ref 8.9–10.3)
Chloride: 101 mmol/L (ref 98–111)
Creatinine, Ser: 0.43 mg/dL — ABNORMAL LOW (ref 0.44–1.00)
GFR calc Af Amer: >60 mL/min (ref >60)
GFR calc non Af Amer: >60 mL/min (ref >60)
Glucose, Bld: 104 mg/dL — ABNORMAL HIGH (ref 70–99)
Potassium: 4.3 mmol/L (ref 3.5–5.1)
Sodium: 136 mmol/L (ref 135–145)
Total Bilirubin: 0.3 mg/dL (ref 0.3–1.2)
Total Protein: 6 g/dL — ABNORMAL LOW (ref 6.5–8.1)

## 2019-12-03 LAB — FERRITIN: Ferritin: 49 ng/mL (ref 11–307)

## 2019-12-03 LAB — C-REACTIVE PROTEIN: CRP: 0.6 mg/dL (ref <1.0)

## 2019-12-03 LAB — D-DIMER, QUANTITATIVE: D-Dimer, Quant: 0.64 ug/mL-FEU — ABNORMAL HIGH (ref 0.00–0.50)

## 2019-12-03 NOTE — Care Management Important Message (Signed)
Important Message  Patient Details IM Letter given to the Patient Name: Theresa Stephens MRN: [id number] Date of Birth: Nov 26, 1950   Medicare Important Message Given:  Yes     Kerin Salen 12/03/2019, 10:25 AM

## 2019-12-03 NOTE — Progress Notes (Signed)
PROGRESS NOTE    Theresa Stephens  ZOX:[id number] DOB: [birth year] DOA: 11/30/2019 PCP: Maurice Small, MD    Brief Narrative:  Acute hypoxic respiratory failure due to SARS COVID-19 viral pneumonia.  69 year old female with past medical history for oral cavities, cell carcinoma and GI bleed, who presented with dyspnea and cough over the last 2 days. Positive aches and pains along with generalized weakness. On her initial physical examination her oximetry was 87% on room air, blood pressure 134/77, heart rate 88, temperature 97.8, respiratory rate 22. She had dyspnea at rest, decreased breath sounds at the right base with positive Rales, heart S1-S2, present rhythmic, soft abdomen, no lower extremity edema. SARS COVID-19 positive. Her chest radiograph had been very faint interstitial infiltrate at the left upper lobe and bilateral bases.  Patient responding well to medical therapy. Psychiatry has recommended inpatient psych treatment when medically stable.    Assessment & Plan:   Principal Problem:   Pneumonia due to COVID-19 virus Active Problems:   Acute hypoxemic respiratory failure due to COVID-19 [facility])   GERD (gastroesophageal reflux disease)   Swallowing dysfunction   Oral cancer (HCC)   Depression   1.Acute hypoxic respiratory failure due to SARS COVID-19 viral pneumonia.  RR: 20  Pulse oxymetry: 94%  Fi02: 21% RA  COVID-19 Labs  Recent Labs    11/30/19 2153 11/30/19 2153 12/01/19 0412 12/02/19 0431 12/03/19 0412  DDIMER 0.92*   < > 0.86* 0.87* 0.64*  FERRITIN 66   < > 57 45 49  LDH 194*  --   --   --   --   CRP 2.0*   < > 1.7* 0.8 0.6   < > = values in this interval not displayed.    Lab Results  Component Value Date   [place] (A) 11/30/2019   [place] NEGATIVE 08/26/2019   Patient is feeling better but not yet back to baseline, continue to have worsening dyspnea on exertion. Inflammatory markers are trending down.  Continue medical  therapy with baricitinib, Remdesivir #4/5, and methylprednisolone . On airway clearing techniques, antitussive agents and bronchodilator therapy.  Tolerating tube feedings well.  2. Sp oral cancer/ sp peg tube. Continue with tube feedings TID.  3. GERD. On pantoprazole.   4. Depression with suicidal thoughts. Continue with prozac and qhs trazodone. One to one suicidal precautions.    Status is: Inpatient  Remains inpatient appropriate because:Inpatient level of care appropriate due to severity of illness   Dispo: The patient is from: Home              Anticipated d/c is to: Inpatient psych              Anticipated d/c date is: 3 days              Patient currently is not medically stable to d/c.   DVT prophylaxis: Enoxaparin   Code Status:   full  Family Communication:  No family at the bedside     Subjective: Patient is feeling better, but continue to have dyspnea on exertion, no nausea or vomiting.   Objective: Vitals:   12/02/19 1217 12/02/19 1224 12/02/19 1943 12/03/19 0539  BP: 111/67 111/67 120/63 114/71  Pulse: 83 83 80 77  Resp: 20 20 (!) 21 20  Temp: 98 F (36.7 C) 97.7 F (36.5 C) 97.6 F (36.4 C) 98.2 F (36.8 C)  TempSrc: Axillary Oral Oral   SpO2: 97% 97% 94% 94%  Weight:  Height:        Intake/Output Summary (Last 24 hours) at 12/03/2019 0958 Last data filed at 12/02/2019 1949 Gross per 24 hour  Intake 0 ml  Output --  Net 0 ml   Filed Weights   12/01/19 0049  Weight: 59.2 kg    Examination:   General: Not in pain or dyspnea, deconditioned  Neurology: Awake and alert, non focal  E ENT: mild pallor, no icterus, oral mucosa moist Cardiovascular: No JVD. S1-S2 present, rhythmic, no gallops, rubs, or murmurs. No lower extremity edema. Pulmonary: positive breath sounds bilaterally, no wheezing.  Gastrointestinal. Abdomen soft and non tender Skin. No rashes Musculoskeletal: no joint deformities     Data Reviewed: I have personally  reviewed following labs and imaging studies  CBC: Recent Labs  Lab 11/30/19 2028 11/30/19 2336 12/01/19 0412  WBC 4.0 4.4 3.2*  NEUTROABS 2.9  --  2.8  HGB 11.1* 10.8* 10.7*  HCT 34.2* 34.7* 33.9*  MCV 82.8 85.0 84.1  PLT 365 348 606   Basic Metabolic Panel: Recent Labs  Lab 11/30/19 2028 12/01/19 0412 12/02/19 0431 12/03/19 0412  NA 139 135 138 136  K 4.2 4.2 4.4 4.3  CL 100 102 102 101  CO2 27 24 26 27   GLUCOSE 105* 155* 154* 104*  BUN 16 13 20  26*  CREATININE 0.42* 0.37* 0.44 0.43*  CALCIUM 9.2 8.4* 8.6* 8.4*   GFR: Estimated Creatinine Clearance: 51.2 mL/min (A) (by C-G formula based on SCr of 0.43 mg/dL (L)). Liver Function Tests: Recent Labs  Lab 11/30/19 2028 12/01/19 0412 12/02/19 0431 12/03/19 0412  AST 62* 45* 37 99*  ALT 61* 53* 49* 114*  ALKPHOS 105 104 114 119  BILITOT 0.3 0.5 0.6 0.3  PROT 7.1 6.7 6.4* 6.0*  ALBUMIN 3.5 3.3* 3.2* 3.0*   Recent Labs  Lab 11/30/19 2028  LIPASE 39   No results for input(s): AMMONIA in the last 168 hours. Coagulation Profile: No results for input(s): INR, PROTIME in the last 168 hours. Cardiac Enzymes: No results for input(s): CKTOTAL, CKMB, CKMBINDEX, TROPONINI in the last 168 hours. BNP (last 3 results) No results for input(s): PROBNP in the last 8760 hours. HbA1C: No results for input(s): HGBA1C in the last 72 hours. CBG: No results for input(s): GLUCAP in the last 168 hours. Lipid Profile: Recent Labs    11/30/19 2153  TRIG 112   Thyroid Function Tests: No results for input(s): TSH, T4TOTAL, FREET4, T3FREE, THYROIDAB in the last 72 hours. Anemia Panel: Recent Labs    12/02/19 0431 12/03/19 0412  FERRITIN 46 49      Radiology Studies: I have reviewed all of the imaging during this hospital visit personally     Scheduled Meds: . vitamin C  500 mg Oral Daily  . aspirin EC  81 mg Oral Daily  . baricitinib  4 mg Per Tube Daily  . chlorpheniramine-HYDROcodone  5 mL Oral Q12H  .  cholecalciferol  1,000 Units Oral Daily  . enoxaparin (LOVENOX) injection  40 mg Subcutaneous QHS  . famotidine  20 mg Oral BID  . feeding supplement (OSMOLITE 1.5 CAL)  355 mL Per Tube TID  . FLUoxetine  30 mg Oral Daily  . methylPREDNISolone (SOLU-MEDROL) injection  40 mg Intravenous Daily  . pantoprazole sodium  40 mg Per Tube BID  . sterile water for irrigation   Irrigation Q6H  . zinc sulfate  220 mg Oral Daily   Continuous Infusions: . remdesivir 100 mg in NS 100  mL 100 mg (12/02/19 0912)     LOS: 3 days        Theresa Kallstrom Gerome Apley, MD

## 2019-12-04 DIAGNOSIS — F329 Major depressive disorder, single episode, unspecified: Secondary | ICD-10-CM

## 2019-12-04 LAB — COMPREHENSIVE METABOLIC PANEL
ALT: 113 U/L — ABNORMAL HIGH (ref 0–44)
AST: 68 U/L — ABNORMAL HIGH (ref 15–41)
Albumin: 2.9 g/dL — ABNORMAL LOW (ref 3.5–5.0)
Alkaline Phosphatase: 100 U/L (ref 38–126)
Anion gap: 8 (ref 5–15)
BUN: 24 mg/dL — ABNORMAL HIGH (ref 8–23)
CO2: 31 mmol/L (ref 22–32)
Calcium: 8.5 mg/dL — ABNORMAL LOW (ref 8.9–10.3)
Chloride: 100 mmol/L (ref 98–111)
Creatinine, Ser: 0.46 mg/dL (ref 0.44–1.00)
GFR calc Af Amer: >60 mL/min (ref >60)
GFR calc non Af Amer: >60 mL/min (ref >60)
Glucose, Bld: 89 mg/dL (ref 70–99)
Potassium: 4.3 mmol/L (ref 3.5–5.1)
Sodium: 139 mmol/L (ref 135–145)
Total Bilirubin: 0.2 mg/dL — ABNORMAL LOW (ref 0.3–1.2)
Total Protein: 5.6 g/dL — ABNORMAL LOW (ref 6.5–8.1)

## 2019-12-04 LAB — C-REACTIVE PROTEIN: CRP: 0.6 mg/dL (ref <1.0)

## 2019-12-04 LAB — FERRITIN: Ferritin: 38 ng/mL (ref 11–307)

## 2019-12-04 LAB — D-DIMER, QUANTITATIVE: D-Dimer, Quant: 0.71 ug/mL-FEU — ABNORMAL HIGH (ref 0.00–0.50)

## 2019-12-04 MED ORDER — INFLUENZA VAC A&B SA ADJ QUAD 0.5 ML IM PRSY
0.5000 mL | PREFILLED_SYRINGE | INTRAMUSCULAR | Status: AC
  Administered 2019-12-05: 0.5 mL via INTRAMUSCULAR
  Filled 2019-12-04: qty 0.5

## 2019-12-04 MED ORDER — PNEUMOCOCCAL VAC POLYVALENT 25 MCG/0.5ML IJ INJ
0.5000 mL | INJECTION | INTRAMUSCULAR | Status: AC
  Administered 2019-12-05: 0.5 mL via INTRAMUSCULAR
  Filled 2019-12-04: qty 0.5

## 2019-12-04 NOTE — Progress Notes (Signed)
PROGRESS NOTE    [facility] BUTRUM  UTM:[id number] DOB: 05-16-1950 DOA: 11/30/2019 PCP: Maurice Small, MD    Brief Narrative:  Acute hypoxic respiratory failure due to SARS COVID-19 viral pneumonia.  68 year old female with past medical history for oral cavities, cell carcinoma and GI bleed, who presented with dyspnea and cough over the last 2 days. Positive aches and pains along with generalized weakness. On her initial physical examination her oximetry was 87% on room air, blood pressure 134/77, heart rate 88, temperature 97.8, respiratory rate 22. She had dyspnea at rest, decreased breath sounds at the right base with positive Rales, heart S1-S2, present rhythmic, soft abdomen, no lower extremity edema. SARS COVID-19 positive. Her chest radiograph had been very faint interstitial infiltrate at the left upper lobe and bilateral bases.  Patient responding well to medical therapy. Psychiatry has recommended inpatient psych treatment when medically stable.     Assessment & Plan:   Principal Problem:   Pneumonia due to COVID-19 virus Active Problems:   Acute hypoxemic respiratory failure due to COVID-19 [facility])   GERD (gastroesophageal reflux disease)   Swallowing dysfunction   Oral cancer (HCC)   Depression    1.Acute hypoxic respiratory failure due to SARS COVID-19 viral pneumonia.  RR: 19  Pulse oxymetry: 97%  Fi02: 1 L/min per [place]  COVID-19 Labs  Recent Labs    12/02/19 0431 12/03/19 0412 12/04/19 0550  DDIMER 0.87* 0.64* 0.71*  FERRITIN 45 49 38  CRP 0.8 0.6 0.6    Lab Results  Component Value Date   SARSCOV2NAA POSITIVE (A) 11/30/2019   [place] NEGATIVE 08/26/2019    Patient continue to have dyspnea on exertion. Inflammatory markers continue to improve.   Medical therapy withbaricitinib,will complete Remdesivir #5/5 today.  Continue with systemic steroids with methylprednisolone, airway clearing techniques, antitussive agents and bronchodilator  therapy.  Physical and occupational therapy evaluation.  2. Sp oral cancer/ sp peg tube.On TID tube feedings/ NPO.   3. GERD. continue with  pantoprazole.   4. Depression with suicidal thoughts.On prozac and qhstrazodone. Continue with one to one suicidal precautions.     Status is: Inpatient  Remains inpatient appropriate because:IV treatments appropriate due to intensity of illness or inability to take PO   Dispo: The patient is from: Home              Anticipated d/c is to: Pscyh unit              Anticipated d/c date is: 3 days              Patient currently is not medically stable to d/c.   DVT prophylaxis: Enoxaparin   Code Status:   full  Family Communication:  No family at the bedside     Consultants:   Psychiatry    Subjective: Patient continue to have fatigue and dyspnea on exertion, no nausea or vomiting, and tolerating tube feedings well.   Objective: Vitals:   12/03/19 1300 12/03/19 1958 12/04/19 0626 12/04/19 0803  BP: 121/76 131/74 135/79   Pulse: 68 90 76   Resp: 18 20 19    Temp: 98 F (36.7 C) 98.2 F (36.8 C) 97.6 F (36.4 C)   TempSrc: Axillary Oral Oral   SpO2: 96% 93% 97% 94%  Weight:      Height:        Intake/Output Summary (Last 24 hours) at 12/04/2019 0942 Last data filed at 12/04/2019 0813 Gross per 24 hour  Intake 1025 ml  Output --  Net 1025 ml   Filed Weights   12/01/19 0049  Weight: 59.2 kg    Examination:   General: Not in pain or dyspnea. Deconditioned.  Neurology: Awake and alert, non focal  E ENT: mild pallor, no icterus, oral mucosa moist Cardiovascular: No JVD. S1-S2 present, rhythmic, no gallops, rubs, or murmurs. No lower extremity edema. Pulmonary: positive breath sounds bilaterally, no wheezing, rhonchi or rales. Gastrointestinal. Abdomen soft and non tender Skin. No rashes Musculoskeletal: no joint deformities     Data Reviewed: I have personally reviewed following labs and imaging  studies  CBC: Recent Labs  Lab 11/30/19 2028 11/30/19 2336 12/01/19 0412  WBC 4.0 4.4 3.2*  NEUTROABS 2.9  --  2.8  HGB 11.1* 10.8* 10.7*  HCT 34.2* 34.7* 33.9*  MCV 82.8 85.0 84.1  PLT 365 348 161   Basic Metabolic Panel: Recent Labs  Lab 11/30/19 2028 12/01/19 0412 12/02/19 0431 12/03/19 0412 12/04/19 0550  NA 139 135 138 136 139  K 4.2 4.2 4.4 4.3 4.3  CL 100 102 102 101 100  CO2 27 24 26 27 31   GLUCOSE 105* 155* 154* 104* 89  BUN 16 13 20  26* 24*  CREATININE 0.42* 0.37* 0.44 0.43* 0.46  CALCIUM 9.2 8.4* 8.6* 8.4* 8.5*   GFR: Estimated Creatinine Clearance: 51.2 mL/min (by C-G formula based on SCr of 0.46 mg/dL). Liver Function Tests: Recent Labs  Lab 11/30/19 2028 12/01/19 0412 12/02/19 0431 12/03/19 0412 12/04/19 0550  AST 62* 45* 37 99* 68*  ALT 61* 53* 49* 114* 113*  ALKPHOS 105 104 114 119 100  BILITOT 0.3 0.5 0.6 0.3 0.2*  PROT 7.1 6.7 6.4* 6.0* 5.6*  ALBUMIN 3.5 3.3* 3.2* 3.0* 2.9*   Recent Labs  Lab 11/30/19 2028  LIPASE 39   No results for input(s): AMMONIA in the last 168 hours. Coagulation Profile: No results for input(s): INR, PROTIME in the last 168 hours. Cardiac Enzymes: No results for input(s): CKTOTAL, CKMB, CKMBINDEX, TROPONINI in the last 168 hours. BNP (last 3 results) No results for input(s): PROBNP in the last 8760 hours. HbA1C: No results for input(s): HGBA1C in the last 72 hours. CBG: No results for input(s): GLUCAP in the last 168 hours. Lipid Profile: No results for input(s): CHOL, HDL, LDLCALC, TRIG, CHOLHDL, LDLDIRECT in the last 72 hours. Thyroid Function Tests: No results for input(s): TSH, T4TOTAL, FREET4, T3FREE, THYROIDAB in the last 72 hours. Anemia Panel: Recent Labs    12/03/19 0412 12/04/19 0550  FERRITIN 49 38      Radiology Studies: I have reviewed all of the imaging during this hospital visit personally     Scheduled Meds: . vitamin C  500 mg Oral Daily  . aspirin EC  81 mg Oral Daily  .  baricitinib  4 mg Per Tube Daily  . chlorpheniramine-HYDROcodone  5 mL Oral Q12H  . cholecalciferol  1,000 Units Oral Daily  . enoxaparin (LOVENOX) injection  40 mg Subcutaneous QHS  . famotidine  20 mg Oral BID  . feeding supplement (OSMOLITE 1.5 CAL)  355 mL Per Tube TID  . FLUoxetine  30 mg Oral Daily  . [START ON 12/05/2019] influenza vaccine adjuvanted  0.5 mL Intramuscular Tomorrow-1000  . methylPREDNISolone (SOLU-MEDROL) injection  40 mg Intravenous Daily  . pantoprazole sodium  40 mg Per Tube BID  . [START ON 12/05/2019] pneumococcal 23 valent vaccine  0.5 mL Intramuscular Tomorrow-1000  . sterile water for irrigation   Irrigation Q6H  . zinc sulfate  220  mg Oral Daily   Continuous Infusions: . remdesivir 100 mg in NS 100 mL 100 mg (12/04/19 0933)     LOS: 4 days        Gwendalyn Mcgonagle Gerome Apley, MD

## 2019-12-04 NOTE — Evaluation (Signed)
Physical Therapy Evaluation Patient Details Name: Theresa Stephens MRN: [id number] DOB: [birth year] Today's Date: 12/04/2019   History of Present Illness  69 year old female with past medical history for oral cavities, cell carcinoma and GI bleed, admitted to [facility] on 9/21 with dyspnea and cough over the last 2 days. Pt with covid PNA. Pt with suidical thoughts, plan for inpt psych post-acutely.  Clinical Impression   Pt presents with generalized weakness, dyspnea on exertion which pt states is a baseline problem, increased time and effort to mobilize, and decreased activity tolerance vs baseline Pt to benefit from acute PT to address deficits. Pt ambulated to and from bathroom and short hallway distance with rest breaks as needed, pt with very inaccurate SpO2 pleth wave but O2 appeared mid-80's% on RA during mobility, requiring 2LO2 to recover dyspnea and SpO2 to >88%. Pt left on 1LO2, resting around 90%. Pt requires close guard for safety given mild unsteadiness, but PT expects pt to progress well with mobility while acute. PT to progress mobility as tolerated, and will continue to follow acutely.      Follow Up Recommendations Supervision for mobility/OOB    Equipment Recommendations  None recommended by PT    Recommendations for Other Services       Precautions / Restrictions Precautions Precautions: Fall Restrictions Weight Bearing Restrictions: No      Mobility  Bed Mobility Overal bed mobility: Needs Assistance Bed Mobility: Supine to Sit     Supine to sit: Supervision     General bed mobility comments: for safety, PT with management of lines/leads  Transfers Overall transfer level: Needs assistance Equipment used: None Transfers: Sit to/from Stand Sit to Stand: Min guard         General transfer comment: min guard for safety, slow to rise and steady  Ambulation/Gait Ambulation/Gait assistance: Min guard Gait Distance (Feet): 70 Feet (15+15+40) Assistive device:  None Gait Pattern/deviations: Step-through pattern;Decreased stride length;Trunk flexed Gait velocity: decr   General Gait Details: min guard for safety, pt reaching for environment to self-steady occasionally. SpO2 with inaccurate pleth throughout, but reaching 85% on RA during gait with DOE 2/4, 2LO2 applied.  Stairs            Wheelchair Mobility    Modified Rankin (Stroke Patients Only)       Balance Overall balance assessment: Needs assistance Sitting-balance support: No upper extremity supported Sitting balance-Leahy Scale: Good       Standing balance-Leahy Scale: Fair                               Pertinent Vitals/Pain Pain Assessment: No/denies pain    Home Living Family/patient expects to be discharged to:: Private residence Living Arrangements: Children (daughter, brother) Available Help at Discharge: Family;Available PRN/intermittently Type of Home: House Home Access: Stairs to enter   Entrance Stairs-Number of Steps: 1 Home Layout: Two level;Able to live on main level with bedroom/bathroom Home Equipment: None      Prior Function Level of Independence: Independent         Comments: pt enjoys cooking for family     Hand Dominance   Dominant Hand: Left    Extremity/Trunk Assessment   Upper Extremity Assessment Upper Extremity Assessment: Defer to OT evaluation    Lower Extremity Assessment Lower Extremity Assessment: Generalized weakness    Cervical / Trunk Assessment Cervical / Trunk Assessment: Normal  Communication   Communication: Expressive difficulties  Cognition Arousal/Alertness: Awake/alert  Behavior During Therapy: WFL for tasks assessed/performed Overall Cognitive Status: Within Functional Limits for tasks assessed                                        General Comments General comments (skin integrity, edema, etc.): SpO2 85-90% during session, on 1LO2 at 90% at rest upon arrival requiring  up to 2LO2 during mobility based more on work of breathing and SpO2 mid-80s but pleth was inaccurate.    Exercises     Assessment/Plan    PT Assessment Patient needs continued PT services  PT Problem List Decreased strength;Decreased mobility;Decreased balance;Decreased knowledge of use of DME;Cardiopulmonary status limiting activity;Decreased activity tolerance       PT Treatment Interventions Gait training;Functional mobility training;Therapeutic activities;Therapeutic exercise;Balance training;Patient/family education;Neuromuscular re-education    PT Goals (Current goals can be found in the Care Plan section)  Acute Rehab PT Goals Patient Stated Goal: none stated PT Goal Formulation: With patient Time For Goal Achievement: 12/18/19 Potential to Achieve Goals: Good    Frequency Min 3X/week   Barriers to discharge        Co-evaluation   Reason for Co-Treatment: For patient/therapist safety;To address functional/ADL transfers PT goals addressed during session: Mobility/safety with mobility OT goals addressed during session: ADL's and self-care       AM-PAC PT "6 Clicks" Mobility  Outcome Measure Help needed turning from your back to your side while in a flat bed without using bedrails?: None Help needed moving from lying on your back to sitting on the side of a flat bed without using bedrails?: None Help needed moving to and from a bed to a chair (including a wheelchair)?: A Little Help needed standing up from a chair using your arms (e.g., wheelchair or bedside chair)?: A Little Help needed to walk in hospital room?: A Little Help needed climbing 3-5 steps with a railing? : A Little 6 Click Score: 20    End of Session Equipment Utilized During Treatment: Oxygen Activity Tolerance: Patient limited by fatigue Patient left: in chair;with nursing/sitter in room;with call bell/phone within reach Nurse Communication: Mobility status PT Visit Diagnosis: Difficulty in  walking, not elsewhere classified (R26.2);Muscle weakness (generalized) (M62.81)    Time: 1051-1130 PT Time Calculation (min) (ACUTE ONLY): 39 min   Charges:   PT Evaluation $PT Eval Low Complexity: 1 Low         Avanti Jetter E, PT Acute Rehabilitation Services Pager [phone]  Office [phone]    Zakira Ressel D Elonda Husky 12/04/2019, 2:25 PM

## 2019-12-04 NOTE — Evaluation (Signed)
Occupational Therapy Evaluation Patient Details Name: Theresa Stephens MRN: [id number] DOB: 1950/10/21 Today's Date: 12/04/2019    History of Present Illness 69 year old female with past medical history for oral cavities, cell carcinoma and GI bleed, admitted to [facility] on 9/21 with dyspnea and cough over the last 2 days. Pt with covid PNA. Pt with suidical thoughts, plan for inpt psych post-acutely.   Clinical Impression   Theresa Stephens is a 81 year old woman with above medical history who presents on 1 liter of oxygen. Patient demonstrated ability to perform bed mobility, ability to don socks, stand at sink to perform grooming task and ambulate without DME with intermittent steadying with one hand. Patient's upper body exhibits WFL strength and ROM. Patient's o2 sats technically difficult to ascertain despite two types of probes throughout evaluation. On room air patient appeared to drop to 88% with some heavy breathing exhibited. Patient reports she breathes heavy and loudly with activity and that anatomically she has a restricted airway from prior surgeries. Patient ambulated in hall with min guard on 2 liters once again displaying heavy breathing without significant complaints of shortness of breath. By the time a good pleth signal was achieved patient o2 sat at 94-95%. Patient left on 1 liter [place] and in care of safety sitter. At this time in regards to ADLs and functional mobility patient appears to be at her baseline. Will defer to PT for continued activity tolerance training. No further OT needs at this time.    Follow Up Recommendations  No OT follow up    Equipment Recommendations  None recommended by OT    Recommendations for Other Services       Precautions / Restrictions Precautions Precautions: Fall Restrictions Weight Bearing Restrictions: No      Mobility Bed Mobility Overal bed mobility: Needs Assistance Bed Mobility: Supine to Sit     Supine to sit: Supervision         Transfers Overall transfer level: Needs assistance Equipment used: None Transfers: Sit to/from Stand Sit to Stand: Min guard         General transfer comment: min guard to ambulate to bathroom, then recliner, then ambulated in the hall - use of hand rail.    Balance Overall balance assessment: Mild deficits observed, not formally tested                                         ADL either performed or assessed with clinical judgement   ADL Overall ADL's : At baseline                                             Vision Baseline Vision/History: Wears glasses Wears Glasses: Reading only       Perception     Praxis      Pertinent Vitals/Pain Pain Assessment: No/denies pain     Hand Dominance Left   Extremity/Trunk Assessment Upper Extremity Assessment Upper Extremity Assessment: Overall WFL for tasks assessed   Lower Extremity Assessment Lower Extremity Assessment: Defer to PT evaluation   Cervical / Trunk Assessment Cervical / Trunk Assessment: Normal   Communication Communication Communication: Expressive difficulties   Cognition Arousal/Alertness: Awake/alert Behavior During Therapy: WFL for tasks assessed/performed Overall Cognitive Status: Within Functional Limits for tasks assessed  General Comments       Exercises     Shoulder Instructions      Home Living Family/patient expects to be discharged to:: Private residence Living Arrangements: Children (daughter, brother) Available Help at Discharge: Family;Available PRN/intermittently Type of Home: House Home Access: Stairs to enter Entrance Stairs-Number of Steps: 1   Home Layout: Two level;Able to live on main level with bedroom/bathroom     Bathroom Shower/Tub: Teacher, early years/pre: Standard     Home Equipment: None          Prior Functioning/Environment Level of Independence:  Independent        Comments: pt enjoys cooking for family        OT Problem List:        OT Treatment/Interventions:      OT Goals(Current goals can be found in the care plan section) Acute Rehab OT Goals Patient Stated Goal: did not state OT Goal Formulation: All assessment and education complete, DC therapy  OT Frequency:     Barriers to D/C:            Co-evaluation PT/OT/SLP Co-Evaluation/Treatment: Yes Reason for Co-Treatment: For patient/therapist safety;To address functional/ADL transfers PT goals addressed during session: Mobility/safety with mobility OT goals addressed during session: ADL's and self-care      AM-PAC OT "6 Clicks" Daily Activity     Outcome Measure Help from another person eating meals?: None Help from another person taking care of personal grooming?: None Help from another person toileting, which includes using toliet, bedpan, or urinal?: None Help from another person bathing (including washing, rinsing, drying)?: None Help from another person to put on and taking off regular upper body clothing?: None Help from another person to put on and taking off regular lower body clothing?: None 6 Click Score: 24   End of Session Equipment Utilized During Treatment: Gait belt;Oxygen Nurse Communication:  (okay to see per rn)  Activity Tolerance: Patient tolerated treatment well Patient left: in chair;with call bell/phone within reach;with nursing/sitter in room  OT Visit Diagnosis: Muscle weakness (generalized) (M62.81)                Time: 1051-1130 OT Time Calculation (min): 39 min Charges:  OT General Charges $OT Visit: 1 Visit OT Evaluation $OT Eval Moderate Complexity: 1 Mod  Lahari Suttles, OTR/L [place]  Office [phone] Pager: [place] 12/04/2019, 12:54 PM

## 2019-12-05 LAB — CULTURE, BLOOD (ROUTINE X 2)
Culture: NO GROWTH
Culture: NO GROWTH
Special Requests: ADEQUATE

## 2019-12-05 LAB — COMPREHENSIVE METABOLIC PANEL
ALT: 84 U/L — ABNORMAL HIGH (ref 0–44)
AST: 34 U/L (ref 15–41)
Albumin: 2.8 g/dL — ABNORMAL LOW (ref 3.5–5.0)
Alkaline Phosphatase: 93 U/L (ref 38–126)
Anion gap: 8 (ref 5–15)
BUN: 23 mg/dL (ref 8–23)
CO2: 32 mmol/L (ref 22–32)
Calcium: 8.5 mg/dL — ABNORMAL LOW (ref 8.9–10.3)
Chloride: 99 mmol/L (ref 98–111)
Creatinine, Ser: 0.48 mg/dL (ref 0.44–1.00)
GFR calc Af Amer: >60 mL/min (ref >60)
GFR calc non Af Amer: >60 mL/min (ref >60)
Glucose, Bld: 86 mg/dL (ref 70–99)
Potassium: 3.9 mmol/L (ref 3.5–5.1)
Sodium: 139 mmol/L (ref 135–145)
Total Bilirubin: 0.3 mg/dL (ref 0.3–1.2)
Total Protein: 5.7 g/dL — ABNORMAL LOW (ref 6.5–8.1)

## 2019-12-05 LAB — C-REACTIVE PROTEIN: CRP: 0.5 mg/dL (ref <1.0)

## 2019-12-05 LAB — D-DIMER, QUANTITATIVE: D-Dimer, Quant: 0.88 ug/mL-FEU — ABNORMAL HIGH (ref 0.00–0.50)

## 2019-12-05 LAB — FERRITIN: Ferritin: 30 ng/mL (ref 11–307)

## 2019-12-05 NOTE — Progress Notes (Signed)
Was stating 95 % on 1 Lnc wean off 02 for now, is currently on room air 02 94 %. I will continue to monitor.

## 2019-12-05 NOTE — Progress Notes (Signed)
At 1404 patient had a yellow mews score. Temp was 96.8 axillary, and SBP 98. MD notified and aware; no new orders. No changes noted from patients assessment. Vitals rechecked. Temp 97.8 axillary SBP 102. Patient is in no distress, up to the restroom. Mews protocol not implemented.

## 2019-12-05 NOTE — TOC Progression Note (Signed)
Transition of Care [facility]) - Progression Note    Patient Details  Name: Theresa Stephens MRN: [id number] Date of Birth: Dec 28, 1950  [facility] CM/SW Contact  Ross Ludwig, [place] Phone Number: 12/05/2019, 2:11 PM  Clinical Narrative:     CSW updated physician that since patient is Covid + it will be difficult to find a geropsychiatry facility that will accept Covid + patients.  CSW spoke to attending physician regarding patient needing to be seen by psych again.  CSW to continue to follow patient's progress throughout discharge planning.     Expected Discharge Plan and Services                                                 Social Determinants of Health (SDOH) Interventions    Readmission Risk Interventions No flowsheet data found.

## 2019-12-05 NOTE — Progress Notes (Signed)
PROGRESS NOTE    Theresa Stephens  BHA:[id number] DOB: 1951-01-16 DOA: 11/30/2019 PCP: Maurice Small, MD    Brief Narrative:  Acute hypoxic respiratory failure due to SARS COVID-19 viral pneumonia.  69 year old female with past medical history for oral cavities, cell carcinoma and GI bleed, who presented with dyspnea and cough over the last 2 days. Positive aches and pains along with generalized weakness. On her initial physical examination her oximetry was 87% on room air, blood pressure 134/77, heart rate 88, temperature 97.8, respiratory rate 22. She had dyspnea at rest, decreased breath sounds at the right base with positive Rales, heart S1-S2, present rhythmic, soft abdomen, no lower extremity edema. SARS COVID-19 positive. Her chest radiograph had been very faint interstitial infiltrate at the left upper lobe and bilateral bases.  Patient responding well to medical therapy. Psychiatry has recommended inpatient psych treatment when medically stable.    Assessment & Plan:   Principal Problem:   Pneumonia due to COVID-19 virus Active Problems:   Acute hypoxemic respiratory failure due to COVID-19 [facility])   GERD (gastroesophageal reflux disease)   Swallowing dysfunction   Oral cancer (HCC)   Depression   1.Acute hypoxic respiratory failure due to SARS COVID-19 viral pneumonia. Sp remdesivir #5/5  RR: 20 Pulse oxymetry: 96%  Fi02: 21% RA  COVID-19 Labs  Recent Labs    12/03/19 0412 12/04/19 0550 12/05/19 0630  DDIMER 0.64* 0.71* 0.88*  FERRITIN 49 38 30  CRP 0.6 0.6 0.5    Lab Results  Component Value Date   SARSCOV2NAA POSITIVE (A) 11/30/2019   [place] NEGATIVE 08/26/2019    Her symptoms continue to improve, D dimer mild elevated.   Tolerating well medical therapy with systemic steroids and baricitinib. Continue with airway clearing techniques, antitussive agents and bronchodilator therapy.  Patient now on room air with oxygenation more than  88%  2. Sp oral cancer/ sp peg tube.continue with TID tube feedings/ NPO.   3. GERD.On pantoprazole.  4. Depression with suicidal thoughts. Continue with prozac and qhstrazodone. On one to one suicidal precautions.She has been calm and cooperative.  Patient will be medically stable for discharge within 24 H, she will need to continue quarantine for 2 more weeks. Re-consult psych in am, for further disposition. It will be difficult to find psych ward that will accommodate respiratory isolation.     Status is: Inpatient  Remains inpatient appropriate because:IV treatments appropriate due to intensity of illness or inability to take PO   Dispo: The patient is from: Home              Anticipated d/c is to: Home              Anticipated d/c date is: 1 day              Patient currently is not medically stable to d/c.   DVT prophylaxis:  enoxaparin   Code Status:   full  Family Communication:   I spoke over the phone with the patient's daughter about patient's  condition, plan of care, prognosis and all questions were addressed.     Subjective: Patient is feeling better, dyspnea is improving, continue using suction for oral secretions, tolerating well tube feedings with no nausea or vomiting.   Objective: Vitals:   12/04/19 1424 12/04/19 2153 12/05/19 0554 12/05/19 0900  BP: 111/70 (!) 142/88 127/72   Pulse: 73 72 76   Resp: 18 20 20    Temp: 97.9 F (36.6 C) (!) 97.1 F (36.2  C) (!) 97.1 F (36.2 C)   TempSrc: Skin Axillary    SpO2: 100% 98% 98% 96%  Weight:      Height:        Intake/Output Summary (Last 24 hours) at 12/05/2019 1048 Last data filed at 12/05/2019 1000 Gross per 24 hour  Intake 1240 ml  Output 0 ml  Net 1240 ml   Filed Weights   12/01/19 0049  Weight: 59.2 kg    Examination:   General: Not in pain or dyspnea, deconditioned  Neurology: Awake and alert, non focal  E ENT: mild  pallor, no icterus, oral mucosa moist Cardiovascular: No  JVD. S1-S2 present, rhythmic, no gallops, rubs, or murmurs. No lower extremity edema. Pulmonary: positive breath sounds bilaterally with no rhonchi or rales. Gastrointestinal. Abdomen soft and non tender Skin. No rashes Musculoskeletal: no joint deformities     Data Reviewed: I have personally reviewed following labs and imaging studies  CBC: Recent Labs  Lab 11/30/19 2028 11/30/19 2336 12/01/19 0412  WBC 4.0 4.4 3.2*  NEUTROABS 2.9  --  2.8  HGB 11.1* 10.8* 10.7*  HCT 34.2* 34.7* 33.9*  MCV 82.8 85.0 84.1  PLT 365 348 545   Basic Metabolic Panel: Recent Labs  Lab 12/01/19 0412 12/02/19 0431 12/03/19 0412 12/04/19 0550 12/05/19 0630  NA 135 138 136 139 139  K 4.2 4.4 4.3 4.3 3.9  CL 102 102 101 100 99  CO2 24 26 27 31  32  GLUCOSE 155* 154* 104* 89 86  BUN 13 20 26* 24* 23  CREATININE 0.37* 0.44 0.43* 0.46 0.48  CALCIUM 8.4* 8.6* 8.4* 8.5* 8.5*   GFR: Estimated Creatinine Clearance: 51.2 mL/min (by C-G formula based on SCr of 0.48 mg/dL). Liver Function Tests: Recent Labs  Lab 12/01/19 0412 12/02/19 0431 12/03/19 0412 12/04/19 0550 12/05/19 0630  AST 45* 37 99* 68* 34  ALT 53* 49* 114* 113* 84*  ALKPHOS 104 114 119 100 93  BILITOT 0.5 0.6 0.3 0.2* 0.3  PROT 6.7 6.4* 6.0* 5.6* 5.7*  ALBUMIN 3.3* 3.2* 3.0* 2.9* 2.8*   Recent Labs  Lab 11/30/19 2028  LIPASE 39   No results for input(s): AMMONIA in the last 168 hours. Coagulation Profile: No results for input(s): INR, PROTIME in the last 168 hours. Cardiac Enzymes: No results for input(s): CKTOTAL, CKMB, CKMBINDEX, TROPONINI in the last 168 hours. BNP (last 3 results) No results for input(s): PROBNP in the last 8760 hours. HbA1C: No results for input(s): HGBA1C in the last 72 hours. CBG: No results for input(s): GLUCAP in the last 168 hours. Lipid Profile: No results for input(s): CHOL, HDL, LDLCALC, TRIG, CHOLHDL, LDLDIRECT in the last 72 hours. Thyroid Function Tests: No results for input(s):  TSH, T4TOTAL, FREET4, T3FREE, THYROIDAB in the last 72 hours. Anemia Panel: Recent Labs    12/04/19 0550 12/05/19 0630  FERRITIN 49 30      Radiology Studies: I have reviewed all of the imaging during this hospital visit personally     Scheduled Meds: . vitamin C  500 mg Oral Daily  . aspirin EC  81 mg Oral Daily  . baricitinib  4 mg Per Tube Daily  . chlorpheniramine-HYDROcodone  5 mL Oral Q12H  . cholecalciferol  1,000 Units Oral Daily  . enoxaparin (LOVENOX) injection  40 mg Subcutaneous QHS  . famotidine  20 mg Oral BID  . feeding supplement (OSMOLITE 1.5 CAL)  355 mL Per Tube TID  . FLUoxetine  30 mg Oral Daily  .  methylPREDNISolone (SOLU-MEDROL) injection  40 mg Intravenous Daily  . pantoprazole sodium  40 mg Per Tube BID  . sterile water for irrigation   Irrigation Q6H  . zinc sulfate  220 mg Oral Daily   Continuous Infusions:   LOS: 5 days        Jere Vanburen Gerome Apley, MD

## 2019-12-06 DIAGNOSIS — F330 Major depressive disorder, recurrent, mild: Secondary | ICD-10-CM

## 2019-12-06 NOTE — Progress Notes (Signed)
PROGRESS NOTE    Theresa Stephens  QQI:[id number] DOB: 1951-01-14 DOA: 11/30/2019 PCP: Maurice Small, MD    Brief Narrative:  Acute hypoxic respiratory failure due to SARS COVID-19 viral pneumonia.  69 year old female with past medical history for oral cavities, cell carcinoma and GI bleed, who presented with dyspnea and cough over the last 2 days. Positive aches and pains along with generalized weakness. On her initial physical examination her oximetry was 87% on room air, blood pressure 134/77, heart rate 88, temperature 97.8, respiratory rate 22. She had dyspnea at rest, decreased breath sounds at the right base with positive Rales, heart S1-S2, present rhythmic, soft abdomen, no lower extremity edema. SARS COVID-19 positive. Her chest radiograph had been very faint interstitial infiltrate at the left upper lobe and bilateral bases.  Patient responding well to medical therapy. Psychiatry has recommended inpatient psych treatment when medically stable.  She has been recovering well from viral pneumonia, completed antiviral therapy. Requested re-evaluation from psychiatry, if patient continue to need inpatient psychiatry.    Assessment & Plan:   Principal Problem:   Pneumonia due to COVID-19 virus Active Problems:   Acute hypoxemic respiratory failure due to COVID-19 [facility])   GERD (gastroesophageal reflux disease)   Swallowing dysfunction   Oral cancer (HCC)   Depression   1.Acute hypoxic respiratory failure due to SARS COVID-19 viral pneumonia. Sp remdesivir #5/5  RR: 17  Pulse oxymetry: 91%  Fi02: 21% RA   COVID-19 Labs  Recent Labs    12/04/19 0550 12/05/19 0630  DDIMER 0.71* 0.88*  FERRITIN 38 30  CRP 0.6 0.5    Lab Results  Component Value Date   SARSCOV2NAA POSITIVE (A) 11/30/2019   [place] NEGATIVE 08/26/2019    Patient is feeling better, but not yet back to baseline, continue to have fatigue and dyspnea. Continue to be on room air.   Continue  medical therapy with systemic steroids and baricitinib. Out of bed to chair and ambulation with physical and occupational therapy.   Patient will need home health.   2. Sp oral cancer/ sp peg tube.On TIDtube feedings/ NPO. With good toleration.   3. GERD.Continue with pantoprazole.  4. Depression with suicidal thoughts. Tolerating well prozac and qhstrazodone. Psychiatry evaluation with recommendation for outpatient follow up. Will discontinue one to one observation, no risk for self or others harm.    Status is: Inpatient  Remains inpatient appropriate because:IV treatments appropriate due to intensity of illness or inability to take PO   Dispo: The patient is from: Home              Anticipated d/c is to: Home              Anticipated d/c date is: 1 day              Patient currently is not medically stable to d/c.   DVT prophylaxis: Enoxaparin   Code Status:   full  Family Communication:   I spoke over the phone with the patient's daughter about patient's  condition, plan of care, prognosis and all questions were addressed.    Subjective: Patient continue to be very weak and deconditioned, no nausea or vomiting, tolerating tube feedings well.   Objective: Vitals:   12/05/19 1404 12/05/19 1441 12/05/19 2134 12/06/19 0554  BP: 98/63 102/65 (!) 141/83 (!) 158/86  Pulse: 82 81 73 81  Resp: 18 19 18 17   Temp: (!) 96.8 F (36 C) 97.8 F (36.6 C) 97.6 F (36.4 C) 97.7  F (36.5 C)  TempSrc: Axillary Axillary Axillary Axillary  SpO2: 92% 92% 93% 91%  Weight:      Height:        Intake/Output Summary (Last 24 hours) at 12/06/2019 0944 Last data filed at 12/06/2019 0533 Gross per 24 hour  Intake 1900 ml  Output --  Net 1900 ml   Filed Weights   12/01/19 0049  Weight: 59.2 kg    Examination:   General: Not in pain or dyspnea, deconditioned  Neurology: Awake and alert, non focal  E ENT: mild pallor, no icterus, oral mucosa moist Cardiovascular: No JVD.  S1-S2 present, rhythmic, no gallops, rubs, or murmurs. No lower extremity edema. Pulmonary: positive breath sounds bilaterally, with no wheezing, rhonchi or rales. Gastrointestinal. Abdomen soft and non tender Skin. No rashes Musculoskeletal: no joint deformities     Data Reviewed: I have personally reviewed following labs and imaging studies  CBC: Recent Labs  Lab 11/30/19 2028 11/30/19 2336 12/01/19 0412  WBC 4.0 4.4 3.2*  NEUTROABS 2.9  --  2.8  HGB 11.1* 10.8* 10.7*  HCT 34.2* 34.7* 33.9*  MCV 82.8 85.0 84.1  PLT 365 348 093   Basic Metabolic Panel: Recent Labs  Lab 12/01/19 0412 12/02/19 0431 12/03/19 0412 12/04/19 0550 12/05/19 0630  NA 135 138 136 139 139  K 4.2 4.4 4.3 4.3 3.9  CL 102 102 101 100 99  CO2 24 26 27 31  32  GLUCOSE 155* 154* 104* 89 86  BUN 13 20 26* 24* 23  CREATININE 0.37* 0.44 0.43* 0.46 0.48  CALCIUM 8.4* 8.6* 8.4* 8.5* 8.5*   GFR: Estimated Creatinine Clearance: 51.2 mL/min (by C-G formula based on SCr of 0.48 mg/dL). Liver Function Tests: Recent Labs  Lab 12/01/19 0412 12/02/19 0431 12/03/19 0412 12/04/19 0550 12/05/19 0630  AST 45* 37 99* 68* 34  ALT 53* 49* 114* 113* 84*  ALKPHOS 104 114 119 100 93  BILITOT 0.5 0.6 0.3 0.2* 0.3  PROT 6.7 6.4* 6.0* 5.6* 5.7*  ALBUMIN 3.3* 3.2* 3.0* 2.9* 2.8*   Recent Labs  Lab 11/30/19 2028  LIPASE 39   No results for input(s): AMMONIA in the last 168 hours. Coagulation Profile: No results for input(s): INR, PROTIME in the last 168 hours. Cardiac Enzymes: No results for input(s): CKTOTAL, CKMB, CKMBINDEX, TROPONINI in the last 168 hours. BNP (last 3 results) No results for input(s): PROBNP in the last 8760 hours. HbA1C: No results for input(s): HGBA1C in the last 72 hours. CBG: No results for input(s): GLUCAP in the last 168 hours. Lipid Profile: No results for input(s): CHOL, HDL, LDLCALC, TRIG, CHOLHDL, LDLDIRECT in the last 72 hours. Thyroid Function Tests: No results for  input(s): TSH, T4TOTAL, FREET4, T3FREE, THYROIDAB in the last 72 hours. Anemia Panel: Recent Labs    12/04/19 0550 12/05/19 0630  FERRITIN 22 30      Radiology Studies: I have reviewed all of the imaging during this hospital visit personally     Scheduled Meds: . vitamin C  500 mg Oral Daily  . aspirin EC  81 mg Oral Daily  . baricitinib  4 mg Per Tube Daily  . chlorpheniramine-HYDROcodone  5 mL Oral Q12H  . cholecalciferol  1,000 Units Oral Daily  . enoxaparin (LOVENOX) injection  40 mg Subcutaneous QHS  . famotidine  20 mg Oral BID  . feeding supplement (OSMOLITE 1.5 CAL)  355 mL Per Tube TID  . FLUoxetine  30 mg Oral Daily  . methylPREDNISolone (SOLU-MEDROL) injection  40 mg Intravenous Daily  . pantoprazole sodium  40 mg Per Tube BID  . sterile water for irrigation   Irrigation Q6H  . zinc sulfate  220 mg Oral Daily   Continuous Infusions:   LOS: 6 days        Mohamed Portlock Gerome Apley, MD

## 2019-12-06 NOTE — Consult Note (Signed)
[facility] Consult   Reason for Consult:  Suicidal ideations Referring Physician:  Dr Cathlean Sauer Patient Identification: Theresa Stephens MRN:  [id number] Principal Diagnosis: Pneumonia due to COVID-19 virus Diagnosis:  Principal Problem:   Pneumonia due to COVID-19 virus Active Problems:   Major depressive disorder, recurrent episode, mild ([place])   Acute hypoxemic respiratory failure due to COVID-19 [facility])   GERD (gastroesophageal reflux disease)   Swallowing dysfunction   Oral cancer ([place])   Depression   Total Time spent with patient: 45 minutes  Subjective:   Theresa Stephens is a 69 y.o. female patient admitted with COVID issues.  HPI:  Patient seen and evaluated in person by this provider.  Admitted for COVID issues and voiced suicidal ideations at one point.  Today, she denies suicidal ideations, hallucinations, homicidal ideations, and substance abuse.  Mild depression r/t physical issues of cancer and COVID. No concerns or questions vocalized.  Calmly resting in her bed watching television, psychiatrically stable at this time.  Follow up with outpatient therapy and/or medication management as needed.  Past Psychiatric History: depression  Risk to Self:  none Risk to Others:  none Prior Inpatient Therapy:  yes Prior Outpatient Therapy:  yes  Past Medical History:  Past Medical History:  Diagnosis Date  . Mouth pain 03/12/2013  . 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 ESOPHAGOGASTRODUODENOSCOPY N/A 08/26/2019   Procedure: ESOPHAGOGASTRODUODENOSCOPY (EGD);  Surgeon: Ronnette Juniper, MD;  Location: [place];  Service: Gastroenterology;  Laterality: N/A;  . HEMOSTASIS CLIP PLACEMENT  08/26/2019   Procedure: HEMOSTASIS CLIP PLACEMENT;  Surgeon: Ronnette Juniper, MD;  Location: [place];   Service: Gastroenterology;;  . HOT HEMOSTASIS N/A 08/26/2019   Procedure: HOT HEMOSTASIS (ARGON PLASMA COAGULATION/BICAP);  Surgeon: Ronnette Juniper, MD;  Location: [place];  Service: Gastroenterology;  Laterality: N/A;  . SCLEROTHERAPY  08/26/2019   Procedure: SCLEROTHERAPY;  Surgeon: Ronnette Juniper, MD;  Location: [place];  Service: Gastroenterology;;  . TONGUE BIOPSY    . TONSILLECTOMY     Family History:  Family History  Problem Relation Age of Onset  . Cancer Maternal Uncle        lung ca  . Cancer Daughter 39       breast ca   Family Psychiatric  History: none Social History:  Social History   Substance and Sexual Activity  Alcohol Use No     Social History   Substance and Sexual Activity  Drug Use No  . Frequency: 2.0 times per week    Social History   Socioeconomic History  . Marital status: Legally Separated    Spouse name: Not on file  . Number of children: 5  . Years of education: 74  . Highest education level: High school graduate  Occupational History  . Occupation: N/A  Tobacco Use  . Smoking status: Former Smoker    Packs/day: 1.00    Years: 45.00    Pack years: 45.00    Quit date: 03/04/2013    Years since quitting: 6.7  . Smokeless tobacco: Never Used  . Tobacco comment: stopped smoking 02/2013  Vaping Use  . Vaping Use: Never used  Substance and Sexual Activity  . Alcohol use: No  . Drug use: No    Frequency: 2.0 times per week  . Sexual activity: Not Currently  Other  Topics Concern  . Not on file  Social History Narrative  . Not on file   Social Determinants of Health   Financial Resource Strain:   . Difficulty of Paying Living Expenses: Not on file  Food Insecurity:   . Worried About Charity fundraiser in the Last Year: Not on file  . Ran Out of Food in the Last Year: Not on file  Transportation Needs:   . Lack of Transportation (Medical): Not on file  . Lack of Transportation (Non-Medical): Not on file  Physical Activity:    . Days of Exercise per Week: Not on file  . Minutes of Exercise per Session: Not on file  Stress:   . Feeling of Stress : Not on file  Social Connections:   . Frequency of Communication with Friends and Family: Not on file  . Frequency of Social Gatherings with Friends and Family: Not on file  . Attends Religious Services: Not on file  . Active Member of Clubs or Organizations: Not on file  . Attends Archivist Meetings: Not on file  . Marital Status: Not on file   Additional Social History:    Allergies:  No Known Allergies  Labs:  Results for orders placed or performed during the hospital encounter of 11/30/19 (from the past 48 hour(s))  Comprehensive metabolic panel     Status: Abnormal   Collection Time: 12/05/19  6:30 AM  Result Value Ref Range   Sodium 139 135 - 145 mmol/L   Potassium 3.9 3.5 - 5.1 mmol/L   Chloride 99 98 - 111 mmol/L   CO2 32 22 - 32 mmol/L   Glucose, Bld 86 70 - 99 mg/dL    Comment: Glucose reference range applies only to samples taken after fasting for at least 8 hours.   BUN 23 8 - 23 mg/dL   Creatinine, Ser 0.48 0.44 - 1.00 mg/dL   Calcium 8.5 (L) 8.9 - 10.3 mg/dL   Total Protein 5.7 (L) 6.5 - 8.1 g/dL   Albumin 2.8 (L) 3.5 - 5.0 g/dL   AST 34 15 - 41 U/L   ALT 84 (H) 0 - 44 U/L   Alkaline Phosphatase 93 38 - 126 U/L   Total Bilirubin 0.3 0.3 - 1.2 mg/dL   GFR calc non Af Amer >60 >60 mL/min   GFR calc Af Amer >60 >60 mL/min   Anion gap 8 5 - 15    Comment: Performed at [facility], [place] [address], [place], [place] [address]  C-reactive protein     Status: None   Collection Time: 12/05/19  6:30 AM  Result Value Ref Range   CRP 0.5 <1.0 mg/dL    Comment: Performed at [facility], [place] [address], [place], [place] [address]  D-dimer, quantitative (not at [facility])     Status: Abnormal   Collection Time: 12/05/19  6:30 AM  Result Value Ref Range   D-Dimer, Quant 0.88 (H) 0.00 - 0.50 ug/mL-FEU     Comment: (NOTE) At the manufacturer cut-off of 0.50 ug/mL FEU, this assay has been documented to exclude PE with a sensitivity and negative predictive value of 97 to 99%.  At this time, this assay has not been approved by the FDA to exclude DVT/VTE. Results should be correlated with clinical presentation. Performed at [facility], [place] [address], [place], [place] [address]   Ferritin     Status: None   Collection Time: 12/05/19  6:30 AM  Result Value Ref Range   Ferritin 30 11 - 307 ng/mL    Comment: Performed at [facility], [place] [address], [place], [place] [address]    Current Facility-Administered Medications  Medication Dose Route Frequency Provider Last Rate Last Admin  . acetaminophen (TYLENOL) tablet 650 mg  650 mg Oral Q6H PRN Mansy, Jan A, MD   650 mg at 12/02/19 2114  . ascorbic acid (VITAMIN C) tablet 500 mg  500 mg Oral Daily Mansy, Jan A, MD   500 mg at 12/06/19 1029  . aspirin EC tablet 81 mg  81 mg Oral Daily Mansy, Jan A, MD   81 mg at 12/06/19 1030  . baricitinib (OLUMIANT) tablet 4 mg  4 mg Per Tube Daily Mansy, Jan A, MD   4 mg at 12/06/19 1026  . chlorpheniramine-HYDROcodone (TUSSIONEX) 10-8 MG/5ML suspension 5 mL  5 mL Oral Q12H Arrien, Jimmy Picket, MD   5 mL at 12/06/19 1028  . cholecalciferol (VITAMIN D3) tablet 1,000 Units  1,000 Units Oral Daily Mansy, Jan A, MD   1,000 Units at 12/06/19 1029  . enoxaparin (LOVENOX) injection 40 mg  40 mg Subcutaneous QHS Mansy, Jan A, MD   40 mg at 12/05/19 2144  . famotidine (PEPCID) tablet 20 mg  20 mg Oral BID Mansy, Jan A, MD   20 mg at 12/06/19 1027  . feeding supplement (OSMOLITE 1.5 CAL) liquid 355 mL  355 mL Per Tube TID Arrien, Jimmy Picket, MD   355 mL at 12/06/19 1029  . FLUoxetine (PROZAC) 20 MG/5ML solution 30 mg  30 mg Oral Daily Arrien, Jimmy Picket, MD   30 mg at 12/06/19 1026  . guaiFENesin-dextromethorphan (ROBITUSSIN DM) 100-10 MG/5ML syrup 10 mL  10 mL Oral Q4H  PRN Mansy, Jan A, MD      . loratadine (CLARITIN) 5 MG/5ML syrup 10 mg  10 mg Oral Daily PRN Mansy, Jan A, MD      . magnesium hydroxide (MILK OF MAGNESIA) suspension 30 mL  30 mL Oral Daily PRN Mansy, Jan A, MD      . methylPREDNISolone sodium succinate (SOLU-MEDROL) 40 mg/mL injection 40 mg  40 mg Intravenous Daily Arrien, Jimmy Picket, MD   40 mg at 12/06/19 1027  . ondansetron (ZOFRAN) tablet 4 mg  4 mg Oral Q6H PRN Mansy, Jan A, MD       Or  . ondansetron [facility]) injection 4 mg  4 mg Intravenous Q6H PRN Mansy, Jan A, MD      . pantoprazole sodium (PROTONIX) 40 mg/20 mL oral suspension 40 mg  40 mg Per Tube BID Mansy, Jan A, MD   40 mg at 12/06/19 1030  . sterile water for irrigation for irrigation   Irrigation Q6H Lang Snow, FNP   200 mL at 12/06/19 1237  . traZODone (DESYREL) tablet 25 mg  25 mg Oral QHS PRN Mansy, Jan A, MD   25 mg at 12/04/19 2151  . zinc sulfate capsule 220 mg  220 mg Oral Daily Mansy, Jan A, MD   220 mg at 12/06/19 1029    Musculoskeletal: Strength & Muscle Tone: decreased Gait & Station: did not witness Patient leans: N/A  Psychiatric Specialty Exam: Physical Exam Vitals and nursing note reviewed.  Constitutional:      Appearance: She is well-developed.  HENT:     Head: Normocephalic.  Pulmonary:     Effort: Pulmonary effort is normal.  Neurological:     General: No focal deficit present.  Mental Status: She is alert and oriented to person, place, and time.  Psychiatric:        Attention and Perception: Attention and perception normal.        Mood and Affect: Mood is depressed.        Speech: Speech normal.        Behavior: Behavior normal. Behavior is cooperative.        Thought Content: Thought content normal.        Cognition and Memory: Cognition and memory normal.        Judgment: Judgment normal.     Review of Systems  Psychiatric/Behavioral: Positive for dysphoric mood.  All other systems reviewed and are negative.   Blood  pressure 114/68, pulse 82, temperature 97.7 F (36.5 C), temperature source Axillary, resp. rate 18, height 4\' 11"  (1.499 m), weight 59.2 kg, SpO2 93 %.Body mass index is 26.36 kg/m.  General Appearance: Casual  Eye Contact:  Good  Speech:  Slow  Volume:  Decreased  Mood:  Depressed  Affect:  Congruent  Thought Process:  Coherent and Descriptions of Associations: Intact  Orientation:  Full (Time, Place, and Person)  Thought Content:  WDL and Logical  Suicidal Thoughts:  No  Homicidal Thoughts:  No  Memory:  Immediate;   Good Recent;   Good Remote;   Good  Judgement:  Fair  Insight:  Good  Psychomotor Activity:  Decreased  Concentration:  Concentration: Good and Attention Span: Good  Recall:  Good  Fund of Knowledge:  Good  Language:  Good  Akathisia:  No  Handed:  Right  AIMS (if indicated):     Assets:  Housing Leisure Time Resilience Social Support  ADL's:  Impaired  Cognition:  WNL  Sleep:        Treatment Plan Summary: Major depressive disorder, recurrent, mild: Follow up with outpatient therapy and medication management  Disposition: No evidence of imminent risk to self or others at present.    Waylan Boga, NP 12/06/2019 2:18 PM

## 2019-12-06 NOTE — Progress Notes (Signed)
Physical Therapy Treatment Patient Details Name: FARHANA FELLOWS MRN: [id number] DOB: 1951-01-29 Today's Date: 12/06/2019    History of Present Illness 69 year old female with past medical history for oral cavities, cell carcinoma and GI bleed, admitted to [facility]/21 with dyspnea and cough over the last 2 days. Pt with covid PNA. Pt with suidical thoughts, plan for inpt psych post-acutely.    PT Comments    Pt continues to participate well. O2 92% on RA, dyspnea 2/4 with ambulation.    Follow Up Recommendations  Supervision for mobility/OOB     Equipment Recommendations  None recommended by PT    Recommendations for Other Services       Precautions / Restrictions Precautions Precautions: Fall Restrictions Weight Bearing Restrictions: No    Mobility  Bed Mobility Overal bed mobility: Needs Assistance Bed Mobility: Sit to Supine       Sit to supine: Modified independent (Device/Increase time)      Transfers Overall transfer level: Needs assistance Equipment used: None Transfers: Sit to/from Stand Sit to Stand: Supervision            Ambulation/Gait Ambulation/Gait assistance: Min guard Gait Distance (Feet): 125 Feet Assistive device: None Gait Pattern/deviations: Step-through pattern;Decreased stride length     General Gait Details: mildly unsteady. O2 92% on RA, dyspnea 2/4.   Stairs             Wheelchair Mobility    Modified Rankin (Stroke Patients Only)       Balance                                            Cognition Arousal/Alertness: Awake/alert Behavior During Therapy: WFL for tasks assessed/performed Overall Cognitive Status: Within Functional Limits for tasks assessed                                        Exercises      General Comments        Pertinent Vitals/Pain Pain Assessment: No/denies pain    Home Living                      Prior Function            PT  Goals (current goals can now be found in the care plan section) Progress towards PT goals: Progressing toward goals    Frequency    Min 3X/week      PT Plan Current plan remains appropriate    Co-evaluation              AM-PAC PT "6 Clicks" Mobility   Outcome Measure  Help needed turning from your back to your side while in a flat bed without using bedrails?: None Help needed moving from lying on your back to sitting on the side of a flat bed without using bedrails?: None Help needed moving to and from a bed to a chair (including a wheelchair)?: A Little Help needed standing up from a chair using your arms (e.g., wheelchair or bedside chair)?: A Little Help needed to walk in hospital room?: A Little Help needed climbing 3-5 steps with a railing? : A Little 6 Click Score: 20    End of Session Equipment Utilized During Treatment: Oxygen Activity Tolerance: Patient tolerated treatment  well Patient left: in bed;with call bell/phone within reach;with nursing/sitter in room   PT Visit Diagnosis: Difficulty in walking, not elsewhere classified (R26.2);Muscle weakness (generalized) (M62.81)     Time: 5488-3014 PT Time Calculation (min) (ACUTE ONLY): 8 min  Charges:  $Gait Training: 8-22 mins                         Doreatha Massed, PT Acute Rehabilitation  Office: [phone] Pager: [phone]

## 2019-12-07 LAB — D-DIMER, QUANTITATIVE: D-Dimer, Quant: 0.56 ug{FEU}/mL — ABNORMAL HIGH (ref 0.00–0.50)

## 2019-12-07 MED ORDER — ACETAMINOPHEN 325 MG PO TABS: 650.0000 mg | ORAL_TABLET | Freq: Four times a day (QID) | ORAL | Status: DC | PRN

## 2019-12-07 MED ORDER — FLUOXETINE HCL 20 MG/5ML PO SOLN: 30.0000 mg | Freq: Every day | ORAL | 0 refills | Status: DC

## 2019-12-07 MED ORDER — GUAIFENESIN-DM 100-10 MG/5ML PO SYRP: 5.0000 mL | ORAL_SOLUTION | Freq: Four times a day (QID) | ORAL | 0 refills | Status: DC | PRN

## 2019-12-07 MED ORDER — VITAMIN D3 25 MCG PO TABS: 1000.0000 [IU] | ORAL_TABLET | Freq: Every day | ORAL | 0 refills | Status: DC

## 2019-12-07 MED ORDER — VITAMIN D 25 MCG (1000 UNIT) PO TABS: 1000.0000 [IU] | ORAL_TABLET | Freq: Every day | ORAL | Status: DC

## 2019-12-07 MED ORDER — CHOLECALCIFEROL 10 MCG/ML (400 UNIT/ML) PO LIQD
1000.0000 [IU] | Freq: Every day | ORAL | Status: DC
  Filled 2019-12-07: qty 2.5

## 2019-12-07 MED ORDER — PANTOPRAZOLE SODIUM 40 MG PO PACK: 40.0000 mg | PACK | Freq: Two times a day (BID) | ORAL | 0 refills | Status: DC

## 2019-12-07 NOTE — Discharge Summary (Signed)
Physician Discharge Summary  Theresa Stephens TGG:[id number] DOB: 09/05/1950 DOA: 11/30/2019  PCP: Maurice Small, MD  Admit date: 11/30/2019 Discharge date: 12/07/2019  Admitted From: Home  Disposition:  Home   Recommendations for Outpatient Follow-up and new medication changes:  1. Follow up with Dr. Justin Mend in 2 weeks  2. Continue self quarantine for 2 more weeks, use a mask in public and maintain physical distancing.   Home Health: yes   Equipment/Devices: no    Discharge Condition: stable  CODE STATUS: full  Diet recommendation: tube feedings.   Brief/Interim Summary: Acute hypoxic respiratory failure due to SARS COVID-19 viral pneumonia.  69 year old female with past medical history for oral cancer, squamous cell carcinoma and history of GI bleed, who presented with dyspnea and cough over the last 2 days. Positive aches and pains along with generalized weakness. On her initial physical examination her oximetry was 87% on room air, blood pressure 134/77, heart rate 88, temperature 97.8, respiratory rate 22. She had dyspnea at rest, decreased breath sounds at the right base with positive rales, heart S1-S2, present rhythmic, soft abdomen, no lower extremity edema. SARS COVID-19 positive. Sodium 139, potassium 4.2, chloride 100, bicarb 27, glucose 105, BUN 16, creatinine 0.42, white count 4.0, hemoglobin 9.1, hematocrit 34.2, platelets 365.  Urinalysis specific gravity 1.040. Her chest radiograph had been very faint interstitial infiltrate at the left upper lobe and bilateral bases. Chest CT with no pulmonary embolism, groundglass opacities, predominantly on the right lung.  Patient responded well to medical therapy.  She reported suicidal ideation and psychiatry was consulted with recommendations of inpatient psych treatment when medically stable. Patient was started with Prozac with good toleration.  She showed signs of recovering well from viral pneumonia, completed antiviral  therapy. Requested re-evaluation from psychiatry, with new recommendations of outpatient psychiatry evaluation, no further risk of harming herself or others.   1.  Acute hypoxic respiratory failure due to SARS COVID-19 viral pneumonia.  Patient received medical therapy with systemic steroids, remdesivir and baricitinib.  Supplemental oxygen per nasal cannula, antitussive agents, bronchodilators and airway clearing techniques. Her symptoms, inflammatory markers and oxygenation improved.  COVID-19 Labs  Recent Labs    12/05/19 0630 12/07/19 0357  DDIMER 0.88* 0.56*  FERRITIN 30  --   CRP 0.5  --     Lab Results  Component Value Date   SARSCOV2NAA POSITIVE (A) 11/30/2019   [place] NEGATIVE 08/26/2019    At discharge her oxygenation is 94% on room air.  Patient will continue as needed bronchodilators and antitussive agents.  2.  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  Patient was placed on tube feeds with good toleration.  3.  GERD with history of GI bleed.  Continue pantoprazole.  4.  Depression with suicidal thoughts.  Patient was placed on Prozac and nightly trazodone.  She was placed on suicide precautions, one-to-one observation.  Psychiatry consultation, final recommendations to follow-up as an outpatient.  Discharge Diagnoses:  Principal Problem:   Pneumonia due to COVID-19 virus Active Problems:   Acute hypoxemic respiratory failure due to COVID-19 [facility])   GERD (gastroesophageal reflux disease)   Swallowing dysfunction   Oral cancer (HCC)   Depression   Major depressive disorder, recurrent episode, mild ([name])    Discharge Instructions   Allergies as of 12/07/2019   No Known Allergies     Medication List    STOP taking these medications   HYDROcodone-acetaminophen 7.5-325 mg/15 ml solution Commonly known as: HYCET  pantoprazole 40 MG tablet Commonly known as: PROTONIX Replaced by: pantoprazole sodium 40 mg/20 mL Pack      TAKE these medications   acetaminophen 325 MG tablet Commonly known as: TYLENOL Take 2 tablets (650 mg total) by mouth every 6 (six) hours as needed for mild pain or headache (fever >/= 101).   FLUoxetine 20 MG/5ML solution Commonly known as: PROZAC Take 7.5 mLs (30 mg total) by mouth daily.   free water Soln Place 200 mLs into feeding tube every 6 (six) hours.   guaiFENesin-dextromethorphan 100-10 MG/5ML syrup Commonly known as: ROBITUSSIN DM Take 5 mLs by mouth every 6 (six) hours as needed for cough.   pantoprazole sodium 40 mg/20 mL Pack Commonly known as: PROTONIX Place 20 mLs (40 mg total) into feeding tube 2 (two) times daily. Replaces: pantoprazole 40 MG tablet       No Known Allergies  Consultations:  Psychiatry    Procedures/Studies: CT Angio Chest PE W/Cm &/Or Wo Cm  Result Date: 11/30/2019 CLINICAL DATA:  Chest pain and tachypnea EXAM: CT ANGIOGRAPHY CHEST WITH CONTRAST TECHNIQUE: Multidetector CT imaging of the chest was performed using the standard protocol during bolus administration of intravenous contrast. Multiplanar CT image reconstructions and MIPs were obtained to evaluate the vascular anatomy. CONTRAST:  165mL OMNIPAQUE IOHEXOL 350 MG/ML SOLN COMPARISON:  Chest x-ray from earlier in the same day, CT from 2015. FINDINGS: Cardiovascular: Thoracic aorta demonstrates atherosclerotic calcifications without aneurysmal dilatation or dissection. No cardiac enlargement is noted. The pulmonary artery is well visualized with a normal branching pattern. Evaluation of the lower lobe branches is somewhat limited by patient motion artifact although no definitive filling defect is seen. Mediastinum/Nodes: Thoracic inlet is within normal limits. Hypodense nodule is noted in the right lobe of the thyroid relatively stable from the prior exam of 2015. Esophagus is within normal limits. No sizable hilar or mediastinal adenopathy is noted. A few small right hilar lymph nodes are  seen likely reactive in nature. Lungs/Pleura: Lungs are well aerated bilaterally. Patchy ground-glass airspace opacities are identified within the right lung. This is consistent with multifocal pneumonia. Correlate with COVID-19 testing which is pending. Upper Abdomen: Visualized upper abdomen shows no acute abnormality. Musculoskeletal: Hemangioma is noted in the L1 vertebral body. Multilevel vertebral degenerative changes are seen in the thoracic spine. No other focal abnormality is noted. Review of the MIP images confirms the above findings. IMPRESSION: No definitive pulmonary emboli are seen. Patchy airspace opacity in the right lung consistent with multifocal pneumonia. Correlate with pending COVID-19 testing. Stable hypodense nodule dating back to 2015. This has been biopsied and shown to be benign in etiology. No followup recommended (ref: J Am Coll Radiol. 2015 Feb;12(2): 143-50). Aortic Atherosclerosis (ICD10-I70.0). Electronically Signed   By: Inez Catalina M.D.   On: 11/30/2019 21:45   DG Chest Portable 1 View  Result Date: 11/30/2019 CLINICAL DATA:  Suicidal ideation, lower abdominal pain, tachypnea, hypoxia EXAM: PORTABLE CHEST 1 VIEW COMPARISON:  08/26/2019 FINDINGS: Single frontal view of the chest demonstrates a stable cardiac silhouette. No airspace disease, effusion, or pneumothorax. Mild central vascular congestion. No acute bony abnormality. IMPRESSION: 1. Mild central vascular congestion without overt edema. Electronically Signed   By: Randa Ngo M.D.   On: 11/30/2019 20:12        Subjective: Patient is feeling better, dyspnea and generalized weakness is improving, tolerating tube feedings well. No nausea or vomiting.   Discharge Exam: Vitals:   12/06/19 2042 12/07/19 0416  BP: 127/78 101/89  Pulse: 82 89  Resp: 19 18  Temp: 98.2 F (36.8 C) 97.7 F (36.5 C)  SpO2: 94% 94%   Vitals:   12/06/19 0554 12/06/19 1412 12/06/19 2042 12/07/19 0416  BP: (!) 158/86 114/68  127/78 101/89  Pulse: 81 82 82 89  Resp: 17 18 19 18   Temp: 97.7 F (36.5 C) 97.7 F (36.5 C) 98.2 F (36.8 C) 97.7 F (36.5 C)  TempSrc: Axillary Axillary Axillary   SpO2: 91% 93% 94% 94%  Weight:      Height:        General: Not in pain or dyspnea,.  Neurology: Awake and alert, non focal  E ENT: mild pallor, no icterus, oral mucosa moist Cardiovascular: No JVD. S1-S2 present, rhythmic, no gallops, rubs, or murmurs. No lower extremity edema. Pulmonary: positive breath sounds bilaterally, with no wheezing, rhonchi or rales. Gastrointestinal. Abdomen soft and non tender.  Skin. No rashes Musculoskeletal: no joint deformities   The results of significant diagnostics from this hospitalization (including imaging, microbiology, ancillary and laboratory) are listed below for reference.     Microbiology: Recent Results (from the past 240 hour(s))  SARS Coronavirus 2 by RT PCR (hospital order, performed in [facility] hospital lab) Nasopharyngeal Nasopharyngeal Swab     Status: Abnormal   Collection Time: 11/30/19  7:59 PM   Specimen: Nasopharyngeal Swab  Result Value Ref Range Status   SARS Coronavirus 2 POSITIVE (A) NEGATIVE Final    Comment: RESULT CALLED TO, READ BACK BY AND VERIFIED WITH: RN Bethann Humble AT 2144 11/30/19 CRUICKSHANK A (NOTE) SARS-CoV-2 target nucleic acids are DETECTED  SARS-CoV-2 RNA is generally detectable in upper respiratory specimens  during the acute phase of infection.  Positive results are indicative  of the presence of the identified virus, but do not rule out bacterial infection or co-infection with other pathogens not detected by the test.  Clinical correlation with patient history and  other diagnostic information is necessary to determine patient infection status.  The expected result is negative.  Fact Sheet for Patients:   StrictlyIdeas.no   Fact Sheet for Healthcare Providers:    BankingDealers.co.za    This test is not yet approved or cleared by the [place] FDA and  has been authorized for detection and/or diagnosis of SARS-CoV-2 by FDA under an Emergency Use Authorization (EUA).  This EUA will remain in effect (meaning t his test can be used) for the duration of  the COVID-19 declaration under Section 564(b)(1) of the Act, 21 U.S.C. section 360-bbb-3(b)(1), unless the authorization is terminated or revoked sooner.  Performed at [facility], [place] [address] [place], [place] [address]   Blood culture (routine x 2)     Status: None   Collection Time: 11/30/19  8:28 PM   Specimen: BLOOD  Result Value Ref Range Status   Specimen Description   Final    BLOOD LEFT ANTECUBITAL Performed at [place] [address], [place], [place] [address]    Special Requests   Final    BOTTLES DRAWN AEROBIC AND ANAEROBIC Blood Culture results may not be optimal due to an excessive volume of blood received in culture bottles Performed at [place] [address], [place], [place] [address]    Culture   Final    NO GROWTH 5 DAYS Performed at [facility], [place] [address] [place],  [address]    Report Status 12/05/2019 FINAL  Final  Blood culture (routine x 2)  Status: None   Collection Time: 11/30/19  8:28 PM   Specimen: BLOOD  Result Value Ref Range Status   Specimen Description   Final    BLOOD BLOOD RIGHT HAND Performed at [place] [address], [place], [place] [address]    Special Requests   Final    BOTTLES DRAWN AEROBIC AND ANAEROBIC Blood Culture adequate volume Performed at [place] [address], [place], [place] [address]    Culture   Final    NO GROWTH 5 DAYS Performed at [facility], [place] [address] [place], [place] [address]    Report Status 12/05/2019 FINAL  Final  Urine culture      Status: Abnormal   Collection Time: 11/30/19 10:28 PM   Specimen: Urine, Random  Result Value Ref Range Status   Specimen Description   Final    URINE, RANDOM Performed at [place] [address] [place], [place] [address]    Special Requests   Final    NONE Performed at [facility], [place] [address] [place], [place] [address]    Culture (A)  Final    <10,000 COLONIES/mL INSIGNIFICANT GROWTH Performed at [place] [address] [place], [place] [address]    Report Status 12/01/2019 FINAL  Final     Labs: BNP (last 3 results) Recent Labs    11/30/19 2028  BNP 73.5   Basic Metabolic Panel: Recent Labs  Lab 12/01/19 0412 12/02/19 0431 12/03/19 0412 12/04/19 0550 12/05/19 0630  NA 135 138 136 139 139  K 4.2 4.4 4.3 4.3 3.9  CL 102 102 101 100 99  CO2 24 26 27 31  32  GLUCOSE 155* 154* 104* 89 86  BUN 13 20 26* 24* 23  CREATININE 0.37* 0.44 0.43* 0.46 0.48  CALCIUM 8.4* 8.6* 8.4* 8.5* 8.5*   Liver Function Tests: Recent Labs  Lab 12/01/19 0412 12/02/19 0431 12/03/19 0412 12/04/19 0550 12/05/19 0630  AST 45* 37 99* 68* 34  ALT 53* 49* 114* 113* 84*  ALKPHOS 104 114 119 100 93  BILITOT 0.5 0.6 0.3 0.2* 0.3  PROT 6.7 6.4* 6.0* 5.6* 5.7*  ALBUMIN 3.3* 3.2* 3.0* 2.9* 2.8*   Recent Labs  Lab 11/30/19 2028  LIPASE 39   No results for input(s): AMMONIA in the last 168 hours. CBC: Recent Labs  Lab 11/30/19 2028 11/30/19 2336 12/01/19 0412  WBC 4.0 4.4 3.2*  NEUTROABS 2.9  --  2.8  HGB 11.1* 10.8* 10.7*  HCT 34.2* 34.7* 33.9*  MCV 82.8 85.0 84.1  PLT 365 348 346   Cardiac Enzymes: No results for input(s): CKTOTAL, CKMB, CKMBINDEX, TROPONINI in the last 168 hours. BNP: Invalid input(s): POCBNP CBG: No results for input(s): GLUCAP in the last 168 hours. D-Dimer Recent Labs    12/05/19 0630 12/07/19 0357  DDIMER 0.88* 0.56*   Hgb A1c No results for input(s): HGBA1C in the last 72  hours. Lipid Profile No results for input(s): CHOL, HDL, LDLCALC, TRIG, CHOLHDL, LDLDIRECT in the last 72 hours. Thyroid function studies No results for input(s): TSH, T4TOTAL, T3FREE, THYROIDAB in the last 72 hours.  Invalid input(s): FREET3 Anemia work up Recent Labs    12/05/19 0630  FERRITIN 30   Urinalysis    Component Value Date/Time   COLORURINE YELLOW 11/30/2019 2228   APPEARANCEUR CLEAR 11/30/2019 2228   LABSPEC 1.040 (H) 11/30/2019 2228   PHURINE 8.0 11/30/2019 2228   GLUCOSEU NEGATIVE  11/30/2019 2228   HGBUR NEGATIVE 11/30/2019 2228   BILIRUBINUR NEGATIVE 11/30/2019 2228   KETONESUR NEGATIVE 11/30/2019 2228   PROTEINUR NEGATIVE 11/30/2019 2228   NITRITE NEGATIVE 11/30/2019 2228   LEUKOCYTESUR NEGATIVE 11/30/2019 2228   Sepsis Labs Invalid input(s): PROCALCITONIN,  WBC,  LACTICIDVEN Microbiology Recent Results (from the past 240 hour(s))  SARS Coronavirus 2 by RT PCR (hospital order, performed in [place] hospital lab) Nasopharyngeal Nasopharyngeal Swab     Status: Abnormal   Collection Time: 11/30/19  7:59 PM   Specimen: Nasopharyngeal Swab  Result Value Ref Range Status   SARS Coronavirus 2 POSITIVE (A) NEGATIVE Final    Comment: RESULT CALLED TO, READ BACK BY AND VERIFIED WITH: RN Bethann Humble AT 2144 11/30/19 CRUICKSHANK A (NOTE) SARS-CoV-2 target nucleic acids are DETECTED  SARS-CoV-2 RNA is generally detectable in upper respiratory specimens  during the acute phase of infection.  Positive results are indicative  of the presence of the identified virus, but do not rule out bacterial infection or co-infection with other pathogens not detected by the test.  Clinical correlation with patient history and  other diagnostic information is necessary to determine patient infection status.  The expected result is negative.  Fact Sheet for Patients:   StrictlyIdeas.no   Fact Sheet for Healthcare Providers:    BankingDealers.co.za    This test is not yet approved or cleared by the [place] FDA and  has been authorized for detection and/or diagnosis of SARS-CoV-2 by FDA under an Emergency Use Authorization (EUA).  This EUA will remain in effect (meaning t his test can be used) for the duration of  the COVID-19 declaration under Section 564(b)(1) of the Act, 21 U.S.C. section 360-bbb-3(b)(1), unless the authorization is terminated or revoked sooner.  Performed at [facility], [place] [address] [place], [place] [address]   Blood culture (routine x 2)     Status: None   Collection Time: 11/30/19  8:28 PM   Specimen: BLOOD  Result Value Ref Range Status   Specimen Description   Final    BLOOD LEFT ANTECUBITAL Performed at [place] [address], [place], [place] [address]    Special Requests   Final    BOTTLES DRAWN AEROBIC AND ANAEROBIC Blood Culture results may not be optimal due to an excessive volume of blood received in culture bottles Performed at [place] [address] [place], [place] [address]    Culture   Final    NO GROWTH 5 DAYS Performed at [facility], [place] [address] [place], [place] [address]    Report Status 12/05/2019 FINAL  Final  Blood culture (routine x 2)     Status: None   Collection Time: 11/30/19  8:28 PM   Specimen: BLOOD  Result Value Ref Range Status   Specimen Description   Final    BLOOD BLOOD RIGHT HAND Performed at [place] [address] [place], [place] [address]    Special Requests   Final    BOTTLES DRAWN AEROBIC AND ANAEROBIC Blood Culture adequate volume Performed at [place] [address], [place], [place] [address]    Culture   Final    NO GROWTH 5 DAYS Performed at [facility], [place] [address] [place], [place] [address]    Report Status 12/05/2019 FINAL  Final  Urine culture      Status: Abnormal   Collection Time: 11/30/19 10:28 PM  Specimen: Urine, Random  Result Value Ref Range Status   Specimen Description   Final    URINE, RANDOM Performed at [place] [address], [place], [place] [address]    Special Requests   Final    NONE Performed at [facility], [place] [address] [place], [place] [address]    Culture (A)  Final    <10,000 COLONIES/mL INSIGNIFICANT GROWTH Performed at [place] [address] [place], [place] [address]    Report Status 12/01/2019 FINAL  Final     Time coordinating discharge: 45 minutes  SIGNED:   Tawni Millers, MD  [facility] 12/07/2019, 10:09 AM

## 2019-12-07 NOTE — Care Management Important Message (Signed)
Important Message  Patient Details IM Letter given to the Patient Name: Theresa Stephens MRN: [id number] Date of Birth: [birth year]   Medicare Important Message Given:  Yes     Kerin Salen 12/07/2019, 10:35 AM

## 2019-12-07 NOTE — Progress Notes (Signed)
  Need for home Oxygen assessment:   Patient resting SpO2: 94, room air  Patient ambulating SpO2: 95, room air   After walking >174feet SpO2: 94, room air   Patient did become tachycardic in the low 100's with ambulation. Returned to baseline after sitting back on bed.    [place], Therapist, sports

## 2019-12-07 NOTE — TOC Transition Note (Signed)
Transition of Care [facility]) - CM/SW Discharge Note   Patient Details  Name: Theresa Stephens MRN: [id number] Date of Birth: March 25, 1950  [facility] CM/SW Contact:  Trish Mage, LCSW Phone Number: 12/07/2019, 11:23 AM   Clinical Narrative:  Patient who is discharging today is is need of [place] services, mental health services.  Tried calling patient but she did not answer. Called daughter who confirmed that patient stays with her, and daughter is with her during the day.  When daughter goes to her second shift job, patient's sister is in the home with her. Daughter states they have not needed any help with PEG tube that has been in place since 2015. She expressed interest in both [facility] services and mental health services for her mother.  Cindie with [facility] has agreed to service patient for PT, OT, RN. Patient is set up for mental health services.  Daughter will supply ride home. No further needs identified.  TOC sign off.    Final next level of care: [place] Barriers to Discharge: No Barriers Identified   Patient Goals and CMS Choice        Discharge Placement                       Discharge Plan and Services                                     Social Determinants of Health (SDOH) Interventions     Readmission Risk Interventions No flowsheet data found.

## 2019-12-20 ENCOUNTER — Emergency Department (HOSPITAL_COMMUNITY): Payer: Medicare Other

## 2019-12-20 ENCOUNTER — Other Ambulatory Visit: Payer: Self-pay

## 2019-12-20 ENCOUNTER — Emergency Department (HOSPITAL_COMMUNITY)
Admission: EM | Admit: 2019-12-20 | Discharge: 2019-12-20 | Disposition: A | Discharge status: Home / Self Care | Payer: Medicare Other | Attending: Emergency Medicine | Admitting: Emergency Medicine

## 2019-12-20 DIAGNOSIS — Z431 Encounter for attention to gastrostomy: Secondary | ICD-10-CM

## 2019-12-20 DIAGNOSIS — K9423 Gastrostomy malfunction: Secondary | ICD-10-CM | POA: Diagnosis present

## 2019-12-20 DIAGNOSIS — Z8616 Personal history of COVID-19: Secondary | ICD-10-CM | POA: Insufficient documentation

## 2019-12-20 DIAGNOSIS — Z85830 Personal history of malignant neoplasm of bone: Secondary | ICD-10-CM | POA: Insufficient documentation

## 2019-12-20 DIAGNOSIS — Z87891 Personal history of nicotine dependence: Secondary | ICD-10-CM | POA: Diagnosis not present

## 2019-12-20 MED ORDER — IOHEXOL 300 MG/ML  SOLN
30.0000 mL | Freq: Once | INTRAMUSCULAR | Status: AC | PRN
  Administered 2019-12-20: 30 mL

## 2019-12-20 NOTE — ED Triage Notes (Signed)
Pt stated G tube balloon burst yesterday and the tube fell out this morning. G tube is in place with a tape on it to hold it.

## 2019-12-20 NOTE — Discharge Instructions (Addendum)
As discussed, your evaluation today has been largely reassuring.  But, it is important that you monitor your condition carefully, and do not hesitate to return to the ED if you develop new, or concerning changes in your condition. ? ?Otherwise, please follow-up with your physician for appropriate ongoing care. ? ?

## 2019-12-20 NOTE — ED Provider Notes (Signed)
[facility] DEPT Provider Note   CSN: [id number] Arrival date & time: 12/20/19  1221     History Chief Complaint  Patient presents with  . Pulled G-Tube    Theresa Stephens is a 69 y.o. female.  HPI    Patient presents with her caretaker who assists with the history.  Line patient has multiple medical issues including prior malignancy, with subsequent dependency on PEG tube. Today the patient's PEG tube had a malfunction, with rupturing of the internal balloon.  No pain, no fever, no vomiting.  With concern for inability to take nutrition and medication she is brought here for evaluation. Past Medical History:  Diagnosis Date  . Mouth pain 03/12/2013  . 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 Major depressive disorder, recurrent episode, mild ([place]) 12/06/2019  . GERD (gastroesophageal reflux disease) 12/01/2019  . Pneumonia due to COVID-19 virus 12/01/2019  . Swallowing dysfunction 12/01/2019  . Oral cancer ([place]) 12/01/2019  . Depression 12/01/2019  . Acute hypoxemic respiratory failure due to COVID-19 ([place]) 11/30/2019  . Iron deficiency anemia due to chronic blood loss 08/28/2019  . Acute blood loss anemia 08/28/2019  . UGIB (upper gastrointestinal bleed) 08/26/2019  . 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 Mouth pain 03/12/2013    Past Surgical History:  Procedure Laterality Date  . ESOPHAGOGASTRODUODENOSCOPY N/A 08/26/2019   Procedure: ESOPHAGOGASTRODUODENOSCOPY (EGD);  Surgeon: Ronnette Juniper, MD;  Location: [facility];  Service: Gastroenterology;  Laterality: N/A;  . HEMOSTASIS CLIP PLACEMENT  08/26/2019   Procedure: HEMOSTASIS CLIP PLACEMENT;  Surgeon: Ronnette Juniper, MD;  Location: [facility];  Service: Gastroenterology;;    . HOT HEMOSTASIS N/A 08/26/2019   Procedure: HOT HEMOSTASIS (ARGON PLASMA COAGULATION/BICAP);  Surgeon: Ronnette Juniper, MD;  Location: [facility];  Service: Gastroenterology;  Laterality: N/A;  . SCLEROTHERAPY  08/26/2019   Procedure: SCLEROTHERAPY;  Surgeon: Ronnette Juniper, MD;  Location: [facility];  Service: Gastroenterology;;  . TONGUE BIOPSY    . TONSILLECTOMY       OB History   No obstetric history on file.     Family History  Problem Relation Age of Onset  . Cancer Maternal Uncle        lung ca  . Cancer Daughter 2       breast ca    Social History   Tobacco Use  . Smoking status: Former Smoker    Packs/day: 1.00    Years: 45.00    Pack years: 45.00    Quit date: 03/04/2013    Years since quitting: 6.8  . Smokeless tobacco: Never Used  . Tobacco comment: stopped smoking 02/2013  Vaping Use  . Vaping Use: Never used  Substance Use Topics  . Alcohol use: No  . Drug use: No    Frequency: 2.0 times per week    Home Medications Prior to Admission medications   Medication Sig Start Date End Date Taking? Authorizing Provider  acetaminophen (TYLENOL) 325 MG tablet Take 2 tablets (650 mg total) by mouth every 6 (six) hours as needed for mild pain or headache (fever >/= 101). 12/07/19   Arrien, Jimmy Picket, MD  cholecalciferol (VITAMIN D) 25 MCG tablet Take 1 tablet (1,000 Units total) by mouth daily. 12/07/19   Arrien, Jimmy Picket, MD  FLUoxetine (PROZAC) 20 MG/5ML  solution Take 7.5 mLs (30 mg total) by mouth daily. 12/07/19 01/06/20  Arrien, Jimmy Picket, MD  guaiFENesin-dextromethorphan (ROBITUSSIN DM) 100-10 MG/5ML syrup Take 5 mLs by mouth every 6 (six) hours as needed for cough. 12/07/19   Arrien, Jimmy Picket, MD  pantoprazole sodium (PROTONIX) 40 mg/20 mL PACK Place 20 mLs (40 mg total) into feeding tube 2 (two) times daily. 12/07/19 01/06/20  Arrien, Jimmy Picket, MD  Water For Irrigation, Sterile (FREE WATER) SOLN Place 200 mLs into feeding tube every  6 (six) hours. 08/29/19   Charlynne Cousins, MD    Allergies    Patient has no known allergies.  Review of Systems   Review of Systems  Constitutional:       Per HPI, otherwise negative  HENT:       Per HPI, otherwise negative  Respiratory:       Per HPI, otherwise negative  Cardiovascular:       Per HPI, otherwise negative  Gastrointestinal: Negative for vomiting.  Endocrine:       Negative aside from HPI  Genitourinary:       Neg aside from HPI   Musculoskeletal:       Per HPI, otherwise negative  Skin: Negative.   Neurological: Negative for syncope.    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  Constitutional:      General: She is not in acute distress.    Appearance: She is well-developed.  HENT:     Head: Normocephalic and atraumatic.  Eyes:     Conjunctiva/sclera: Conjunctivae normal.  Pulmonary:     Effort: Pulmonary effort is normal. No respiratory distress.     Breath sounds: No stridor.  Abdominal:     General: There is no distension.    Skin:    General: Skin is warm and dry.  Neurological:     Mental Status: She is alert and oriented to person, place, and time.     Cranial Nerves: No cranial nerve deficit.     ED Results / Procedures / Treatments    Radiology DG Abd 1 View  Result Date: 12/20/2019 CLINICAL DATA:  Check gastric catheter placement EXAM: ABDOMEN - 1 VIEW COMPARISON:  None. FINDINGS: Contrast was administered via an indwelling gastrostomy catheter. Contrast material flows freely into the stomach and subsequently into the small bowel. No obstructive changes are noted. IMPRESSION: Gastrostomy catheter within the stomach. Electronically Signed   By:  Inez Catalina M.D.   On: 12/20/2019 16:20    Procedures FEEDING TUBE REPLACEMENT  Date/Time: 12/20/2019 2:30 PM Performed by: Carmin Muskrat, MD Authorized by: Carmin Muskrat, MD  Preparation: Patient was prepped and draped in the usual sterile fashion. Indications: tube malfunction Local anesthesia used: no  Anesthesia: Local anesthesia used: no  Sedation: Patient sedated: no  Tube type: gastrostomy Patient position: sitting Procedure type: replacement Tube size: 18 Fr Endoscope used: no Bulb inflation volume: 5 (ml) Placement/position confirmation: x-ray Tube placement difficulty: minimal Patient tolerance: patient tolerated the procedure well with no immediate complications    (including critical care time)  Medications Ordered in ED Medications  iohexol (OMNIPAQUE)  300 MG/ML solution 30 mL (30 mLs Per Tube Contrast Given 12/20/19 1557)    ED Course  I have reviewed the triage vital signs and the nursing notes.  Pertinent labs & imaging results that were available during my care of the patient were reviewed by me and considered in my medical decision making (see chart for details).  Adult female, PEG tube dependent presents after malfunction of her existing tube.  The patient had successful replacement here, with appropriate flow, and check on x-ray. Patient discharged in stable condition. Final Clinical Impression(s) / ED Diagnoses Final diagnoses:  PEG (percutaneous endoscopic gastrostomy) adjustment/replacement/removal [facility])    Rx / [place] Orders ED Discharge Orders    None       Carmin Muskrat, MD 12/20/19 1631

## 2019-12-20 NOTE — ED Notes (Signed)
An After Visit Summary was printed and given to the patient. Discharge instructions given and no further questions at this time.  

## 2020-08-03 ENCOUNTER — Encounter (HOSPITAL_COMMUNITY): Payer: Self-pay | Admitting: Emergency Medicine

## 2020-08-03 ENCOUNTER — Emergency Department (HOSPITAL_COMMUNITY)
Admission: EM | Admit: 2020-08-03 | Discharge: 2020-08-03 | Disposition: A | Discharge status: Home / Self Care | Payer: Medicare Other | Source: Home / Self Care | Attending: Emergency Medicine | Admitting: Emergency Medicine

## 2020-08-03 ENCOUNTER — Emergency Department (HOSPITAL_COMMUNITY): Payer: Medicare Other

## 2020-08-03 ENCOUNTER — Encounter (HOSPITAL_COMMUNITY): Payer: Self-pay | Admitting: *Deleted

## 2020-08-03 ENCOUNTER — Emergency Department (HOSPITAL_COMMUNITY)
Admission: EM | Admit: 2020-08-03 | Discharge: 2020-08-03 | Disposition: A | Discharge status: Home / Self Care | Payer: Medicare Other | Attending: Emergency Medicine | Admitting: Emergency Medicine

## 2020-08-03 DIAGNOSIS — S0003XA Contusion of scalp, initial encounter: Secondary | ICD-10-CM | POA: Insufficient documentation

## 2020-08-03 DIAGNOSIS — R519 Headache, unspecified: Secondary | ICD-10-CM | POA: Insufficient documentation

## 2020-08-03 DIAGNOSIS — W19XXXA Unspecified fall, initial encounter: Secondary | ICD-10-CM | POA: Insufficient documentation

## 2020-08-03 DIAGNOSIS — Z87891 Personal history of nicotine dependence: Secondary | ICD-10-CM | POA: Insufficient documentation

## 2020-08-03 DIAGNOSIS — S50311A Abrasion of right elbow, initial encounter: Secondary | ICD-10-CM | POA: Insufficient documentation

## 2020-08-03 DIAGNOSIS — Z8616 Personal history of COVID-19: Secondary | ICD-10-CM | POA: Insufficient documentation

## 2020-08-03 DIAGNOSIS — W01198A Fall on same level from slipping, tripping and stumbling with subsequent striking against other object, initial encounter: Secondary | ICD-10-CM | POA: Insufficient documentation

## 2020-08-03 DIAGNOSIS — Z85819 Personal history of malignant neoplasm of unspecified site of lip, oral cavity, and pharynx: Secondary | ICD-10-CM | POA: Insufficient documentation

## 2020-08-03 DIAGNOSIS — S0990XA Unspecified injury of head, initial encounter: Secondary | ICD-10-CM | POA: Diagnosis present

## 2020-08-03 DIAGNOSIS — T148XXA Other injury of unspecified body region, initial encounter: Secondary | ICD-10-CM

## 2020-08-03 NOTE — ED Triage Notes (Signed)
Pt fell backwards after breaking up a fight between her dogs, hitting the back of her head and right elbow. Pt reports headache from fall, also has abrasion to right elbow.

## 2020-08-03 NOTE — ED Provider Notes (Signed)
  Face-to-face evaluation   History: She is here for evaluation of a head injury, when she excellently fell backwards while trying to break up a dog fight.  There was no loss of consciousness.  She has recovered her usual baseline.  Physical exam: Elderly female alert and cooperative she is not uncomfortable.  She is moving arms and legs normally.  She is able to manipulate a cane which she is holding.  Head is nontender to palpation.  Medical screening examination/treatment/procedure(s) were conducted as a shared visit with non-physician practitioner(s) and myself.  I personally evaluated the patient during the encounter    Daleen Bo, MD 08/04/20 [phone]

## 2020-08-03 NOTE — ED Notes (Addendum)
Pt left waiting room area with family members. Family member was angry and told pt to leave, even though pt stated she did not want to leave.

## 2020-08-03 NOTE — Discharge Instructions (Addendum)
Keep your wound clean and dry.  Return to the emergency department for any worsening pain, nausea/vomiting or any other worsening concerning symptoms.

## 2020-08-03 NOTE — ED Notes (Signed)
Wound cleaned and dressing applied.  

## 2020-08-03 NOTE — ED Notes (Signed)
Called pt 3x to confirm no longer in the [facility] ([place])

## 2020-08-03 NOTE — ED Provider Notes (Signed)
[facility] DEPT Provider Note   CSN: [id number] Arrival date & time: 08/03/20  1111     History Chief Complaint  Patient presents with  . Fall  . Head Injury    Theresa Stephens is a 70 y.o. female presents for evaluation of a fall that occurred this morning. Patient reports her dog started fighting with another dog. She pulled her dog away from the fighting dog and the collar broke causing her to fall back and hit her posterior head. Patient thinks she may have had LOC for a minute. She is not on blood thinners. She has been able to ambulate since this happened. Reports she also hit her right elbow. She denies any neck pain, numbness/weakness, nausea/vomiting.   The history is provided by the patient.       Past Medical History:  Diagnosis Date  . Mouth pain 03/12/2013  . 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 Major depressive disorder, recurrent episode, mild ([place]) 12/06/2019  . GERD (gastroesophageal reflux disease) 12/01/2019  . Pneumonia due to COVID-19 virus 12/01/2019  . Swallowing dysfunction 12/01/2019  . Oral cancer ([place]) 12/01/2019  . Depression 12/01/2019  . Acute hypoxemic respiratory failure due to COVID-19 ([place]) 11/30/2019  . Iron deficiency anemia due to chronic blood loss 08/28/2019  . Acute blood loss anemia 08/28/2019  . UGIB (upper gastrointestinal bleed) 08/26/2019  . 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 Mouth pain 03/12/2013    Past Surgical History:  Procedure Laterality Date  . ESOPHAGOGASTRODUODENOSCOPY N/A 08/26/2019   Procedure: ESOPHAGOGASTRODUODENOSCOPY (EGD);  Surgeon: Ronnette Juniper, MD;  Location: [facility];  Service: Gastroenterology;  Laterality: N/A;  . HEMOSTASIS CLIP PLACEMENT  08/26/2019    Procedure: HEMOSTASIS CLIP PLACEMENT;  Surgeon: Ronnette Juniper, MD;  Location: [facility];  Service: Gastroenterology;;  . HOT HEMOSTASIS N/A 08/26/2019   Procedure: HOT HEMOSTASIS (ARGON PLASMA COAGULATION/BICAP);  Surgeon: Ronnette Juniper, MD;  Location: [facility];  Service: Gastroenterology;  Laterality: N/A;  . SCLEROTHERAPY  08/26/2019   Procedure: SCLEROTHERAPY;  Surgeon: Ronnette Juniper, MD;  Location: [facility];  Service: Gastroenterology;;  . TONGUE BIOPSY    . TONSILLECTOMY       OB History   No obstetric history on file.     Family History  Problem Relation Age of Onset  . Cancer Maternal Uncle        lung ca  . Cancer Daughter 51       breast ca    Social History   Tobacco Use  . Smoking status: Former Smoker    Packs/day: 1.00    Years: 45.00    Pack years: 45.00    Quit date: 03/04/2013    Years since quitting: 7.4  . Smokeless tobacco: Never Used  . Tobacco comment: stopped smoking 02/2013  Vaping Use  . Vaping Use: Never used  Substance Use Topics  . Alcohol use: No  . Drug use: No    Frequency: 2.0 times per week    Home Medications Prior to Admission medications   Medication Sig Start Date End Date Taking? Authorizing Provider  acetaminophen (TYLENOL) 325 MG tablet Take 2 tablets (650 mg total) by mouth every 6 (six) hours as needed for mild pain or headache (fever >/= 101). 12/07/19   Arrien, Mauricio  Quillian Quince, MD  cholecalciferol (VITAMIN D) 25 MCG tablet Take 1 tablet (1,000 Units total) by mouth daily. 12/07/19   Arrien, Jimmy Picket, MD  FLUoxetine (PROZAC) 20 MG/5ML solution Take 7.5 mLs (30 mg total) by mouth daily. 12/07/19 01/06/20  Arrien, Jimmy Picket, MD  guaiFENesin-dextromethorphan (ROBITUSSIN DM) 100-10 MG/5ML syrup Take 5 mLs by mouth every 6 (six) hours as needed for cough. 12/07/19   Arrien, Jimmy Picket, MD  pantoprazole sodium (PROTONIX) 40 mg/20 mL PACK Place 20 mLs (40 mg total) into feeding tube 2 (two) times daily. 12/07/19  01/06/20  Arrien, Jimmy Picket, MD  Water For Irrigation, Sterile (FREE WATER) SOLN Place 200 mLs into feeding tube every 6 (six) hours. 08/29/19   Charlynne Cousins, MD    Allergies    Patient has no known allergies.  Review of Systems   Review of Systems  Eyes: Negative for visual disturbance.  Respiratory: Negative for cough and shortness of breath.   Cardiovascular: Negative for chest pain.  Gastrointestinal: Negative for abdominal pain, nausea and vomiting.  Musculoskeletal: Negative for back pain and neck pain.  Neurological: Positive for headaches. Negative for weakness and numbness.  All other systems reviewed and are negative.   Physical Exam Updated Vital Signs BP (!) 159/90 (BP Location: Left Arm)   Pulse 72   Temp (!) 97.1 F (36.2 C) (Axillary)   Resp 18   SpO2 95%   Physical Exam Vitals and nursing note reviewed.  Constitutional:      Appearance: She is well-developed.  HENT:     Head: Normocephalic.      Comments: Tenderness to palpation noted to the posterior scalp. No underlying skull deformity or crepitus noted. No laceration.  Eyes:     General: No scleral icterus.       Right eye: No discharge.        Left eye: No discharge.     Conjunctiva/sclera: Conjunctivae normal.     Comments: PERRL. EOMs intact. No nystagmus. No neglect.   Neck:     Comments: Full flexion/extension and lateral movement of neck fully intact. No bony midline tenderness. No deformities or crepitus.  Pulmonary:     Effort: Pulmonary effort is normal.     Comments: Lungs clear to auscultation bilaterally.  Symmetric chest rise.  No wheezing, rales, rhonchi. Musculoskeletal:     Comments: Mild abrasions noted to the right elbow.  No deep laceration, open wounds.  Flexion/tension of elbow intact by difficulty.  Skin:    General: Skin is warm and dry.  Neurological:     Mental Status: She is alert.     Comments: Cranial nerves III-XII intact though unable to assess  hypoglossal nerve.  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 No facial droop.   Psychiatric:        Speech: Speech normal.        Behavior: Behavior normal.     ED Results / Procedures / Treatments   Labs (all labs ordered are listed, but only abnormal results are displayed) Labs Reviewed - No data to display  EKG None  Radiology DG Elbow Complete Right  Result Date: 08/03/2020 CLINICAL DATA:  Right elbow pain following fall, initial encounter EXAM: RIGHT ELBOW - COMPLETE 3+ VIEW COMPARISON:  None. FINDINGS: There is no evidence of fracture, dislocation, or joint effusion. There is no evidence  of arthropathy or other focal bone abnormality. Soft tissues are unremarkable. IMPRESSION: No acute abnormality noted. Electronically Signed   By: Inez Catalina M.D.   On: 08/03/2020 12:15   CT Head Wo Contrast  Result Date: 08/03/2020 CLINICAL DATA:  Recent fall with facial pain, initial encounter EXAM: CT HEAD WITHOUT CONTRAST TECHNIQUE: Contiguous axial images were obtained from the base of the skull through the vertex without intravenous contrast. COMPARISON:  03/16/2015 FINDINGS: Brain: No evidence of acute infarction, hemorrhage, hydrocephalus, extra-axial collection or mass lesion/mass effect. Chronic white matter ischemic changes are seen. Vascular: No hyperdense vessel or unexpected calcification. Skull: Skull is within normal limits. Sinuses/Orbits: No acute finding. Other: There are postoperative changes along the right mandible with resection. Previously seen hardware is not identified at this time. Soft tissue changes are noted in the operative bed as well. Scalp hematoma is noted in the right posterior parietal region near the vertex consistent with the recent injury. IMPRESSION: Chronic white matter ischemic changes. No  acute intracranial abnormality is noted. Postsurgical changes on the right along the mandible consistent with the given clinical history. Right posterior parietal scalp hematoma. Electronically Signed   By: Inez Catalina M.D.   On: 08/03/2020 12:20    Procedures Procedures   Medications Ordered in ED Medications - No data to display  ED Course  I have reviewed the triage vital signs and the nursing notes.  Pertinent labs & imaging results that were available during my care of the patient were reviewed by me and considered in my medical decision making (see chart for details).    MDM Rules/Calculators/A&P                          70 year old female who presents for evaluation after mechanical fall that occurred earlier today.  Reports she was trying to break up a fight between 2 dogs and states that the collar broke, causing her to fall backwards.  She reports hitting her head.  She thinks she may have had LOC for about a second.  She is not on a blood thinners.  On initial arrival, she is afebrile, nontoxic-appearing.  Vital signs are stable.  On exam, she has some tenderness to the posterior aspect of head.  No laceration.  No neck pain.  She has some mild abrasions noted to the elbow.  No deep wounds or lacerations.  She has some bony tenderness noted.  She has full flexion/tension without any difficulty.  We will plan to check imaging.    CT head negative for any acute abnormality. XR of right elbow intact without an bony abnormality.   Discussed results with patient.  Patient has been ambulatory since this happened.  She is well-appearing.  Patient stable for discharge home. At this time, patient exhibits no emergent life-threatening condition that require further evaluation in ED. Patient had ample opportunity for questions and discussion. All patient's questions were answered with full understanding. Strict return precautions discussed. Patient expresses understanding and agreement to  plan.   Portions of this note were generated with [employer]. Dictation errors may occur despite best attempts at proofreading.   Final Clinical Impression(s) / ED Diagnoses Final diagnoses:  Fall, initial encounter  Hematoma    Rx / [place] Orders ED Discharge Orders    None       Desma Mcgregor 08/03/20 1433    Daleen Bo, MD 08/04/20 [phone]

## 2020-08-03 NOTE — ED Triage Notes (Signed)
Pt arrives via EMS from home with fall while out chasing the dog. Pt has hematoma to back of head, no external bleeding, no blood thinners. Pt alert, appropriate. Pt with slurred speech due to lack of tounge, baseline for patient.

## 2020-08-03 NOTE — ED Notes (Signed)
EKG done at 1024 was error and not this pts.

## 2020-12-04 ENCOUNTER — Observation Stay (HOSPITAL_COMMUNITY)
Admission: EM | Admit: 2020-12-04 | Discharge: 2020-12-05 | Disposition: A | Discharge status: Home / Self Care | Payer: Medicare Other | Attending: Internal Medicine | Admitting: Internal Medicine

## 2020-12-04 ENCOUNTER — Encounter (HOSPITAL_COMMUNITY): Payer: Self-pay

## 2020-12-04 ENCOUNTER — Other Ambulatory Visit: Payer: Self-pay

## 2020-12-04 DIAGNOSIS — Z79899 Other long term (current) drug therapy: Secondary | ICD-10-CM | POA: Diagnosis not present

## 2020-12-04 DIAGNOSIS — K922 Gastrointestinal hemorrhage, unspecified: Secondary | ICD-10-CM | POA: Diagnosis present

## 2020-12-04 DIAGNOSIS — Z85818 Personal history of malignant neoplasm of other sites of lip, oral cavity, and pharynx: Secondary | ICD-10-CM | POA: Diagnosis not present

## 2020-12-04 DIAGNOSIS — Z87891 Personal history of nicotine dependence: Secondary | ICD-10-CM | POA: Insufficient documentation

## 2020-12-04 DIAGNOSIS — K921 Melena: Secondary | ICD-10-CM | POA: Diagnosis not present

## 2020-12-04 DIAGNOSIS — Z20822 Contact with and (suspected) exposure to covid-19: Secondary | ICD-10-CM | POA: Diagnosis not present

## 2020-12-04 DIAGNOSIS — K9421 Gastrostomy hemorrhage: Secondary | ICD-10-CM | POA: Diagnosis present

## 2020-12-04 DIAGNOSIS — F330 Major depressive disorder, recurrent, mild: Secondary | ICD-10-CM | POA: Diagnosis not present

## 2020-12-04 DIAGNOSIS — F32A Depression, unspecified: Secondary | ICD-10-CM | POA: Diagnosis present

## 2020-12-04 DIAGNOSIS — Z8616 Personal history of COVID-19: Secondary | ICD-10-CM | POA: Diagnosis not present

## 2020-12-04 DIAGNOSIS — C069 Malignant neoplasm of mouth, unspecified: Secondary | ICD-10-CM | POA: Diagnosis present

## 2020-12-04 HISTORY — DX: Peptic ulcer, site unspecified, unspecified as acute or chronic, without hemorrhage or perforation: K27.9

## 2020-12-04 LAB — COMPREHENSIVE METABOLIC PANEL
ALT: 13 U/L (ref 0–44)
AST: 21 U/L (ref 15–41)
Albumin: 4.6 g/dL (ref 3.5–5.0)
Alkaline Phosphatase: 72 U/L (ref 38–126)
Anion gap: 10 (ref 5–15)
BUN: 17 mg/dL (ref 8–23)
CO2: 30 mmol/L (ref 22–32)
Calcium: 9.5 mg/dL (ref 8.9–10.3)
Chloride: 98 mmol/L (ref 98–111)
Creatinine, Ser: 0.41 mg/dL — ABNORMAL LOW (ref 0.44–1.00)
GFR, Estimated: >60 mL/min (ref >60)
Glucose, Bld: 111 mg/dL — ABNORMAL HIGH (ref 70–99)
Potassium: 4 mmol/L (ref 3.5–5.1)
Sodium: 138 mmol/L (ref 135–145)
Total Bilirubin: 0.6 mg/dL (ref 0.3–1.2)
Total Protein: 7.8 g/dL (ref 6.5–8.1)

## 2020-12-04 LAB — POC OCCULT BLOOD, ED: Fecal Occult Bld: POSITIVE — AB

## 2020-12-04 LAB — RESP PANEL BY RT-PCR (FLU A&B, COVID) ARPGX2
Influenza A by PCR: NEGATIVE
Influenza B by PCR: NEGATIVE
SARS Coronavirus 2 by RT PCR: NEGATIVE

## 2020-12-04 LAB — CBC
HCT: 40.1 % (ref 36.0–46.0)
Hemoglobin: 13.2 g/dL (ref 12.0–15.0)
MCH: 30.2 pg (ref 26.0–34.0)
MCHC: 32.9 g/dL (ref 30.0–36.0)
MCV: 91.8 fL (ref 80.0–100.0)
Platelets: 271 10*3/uL (ref 150–400)
RBC: 4.37 MIL/uL (ref 3.87–5.11)
RDW: 12.8 % (ref 11.5–15.5)
WBC: 4.6 10*3/uL (ref 4.0–10.5)
nRBC: 0 % (ref 0.0–0.2)

## 2020-12-04 LAB — HEMOGLOBIN AND HEMATOCRIT, BLOOD
HCT: 34.9 % — ABNORMAL LOW (ref 36.0–46.0)
Hemoglobin: 11.6 g/dL — ABNORMAL LOW (ref 12.0–15.0)

## 2020-12-04 LAB — TYPE AND SCREEN
ABO/RH(D): B POS
Antibody Screen: NEGATIVE

## 2020-12-04 MED ORDER — MIRTAZAPINE 15 MG PO TABS
7.5000 mg | ORAL_TABLET | Freq: Every day | ORAL | Status: DC
  Administered 2020-12-04: 7.5 mg
  Filled 2020-12-04: qty 1

## 2020-12-04 MED ORDER — ONDANSETRON HCL 4 MG/2ML IJ SOLN: 4.0000 mg | Freq: Four times a day (QID) | INTRAMUSCULAR | Status: DC | PRN

## 2020-12-04 MED ORDER — ONDANSETRON HCL 4 MG PO TABS: 4.0000 mg | ORAL_TABLET | Freq: Four times a day (QID) | ORAL | Status: DC | PRN

## 2020-12-04 MED ORDER — ACETAMINOPHEN 325 MG PO TABS: 650.0000 mg | ORAL_TABLET | Freq: Four times a day (QID) | ORAL | Status: DC | PRN

## 2020-12-04 MED ORDER — SODIUM CHLORIDE 0.9 % IV SOLN: INTRAVENOUS | Status: DC

## 2020-12-04 MED ORDER — FLUOXETINE HCL 20 MG/5ML PO SOLN
30.0000 mg | Freq: Every day | ORAL | Status: DC
  Administered 2020-12-05: 30 mg
  Filled 2020-12-04: qty 10

## 2020-12-04 MED ORDER — SUCRALFATE 1 GM/10ML PO SUSP
1.0000 g | Freq: Four times a day (QID) | ORAL | Status: DC
  Administered 2020-12-04: 1 g via ORAL
  Filled 2020-12-04: qty 10

## 2020-12-04 MED ORDER — ACETAMINOPHEN 325 MG PO TABS
650.0000 mg | ORAL_TABLET | Freq: Four times a day (QID) | ORAL | Status: DC | PRN
  Administered 2020-12-04: 650 mg
  Filled 2020-12-04: qty 2

## 2020-12-04 MED ORDER — ACETAMINOPHEN 650 MG RE SUPP: 650.0000 mg | Freq: Four times a day (QID) | RECTAL | Status: DC | PRN

## 2020-12-04 MED ORDER — SUCRALFATE 1 GM/10ML PO SUSP
1.0000 g | Freq: Four times a day (QID) | ORAL | Status: DC
  Administered 2020-12-05 (×3): 1 g
  Filled 2020-12-04 (×3): qty 10

## 2020-12-04 MED ORDER — PANTOPRAZOLE SODIUM 40 MG IV SOLR
40.0000 mg | Freq: Two times a day (BID) | INTRAVENOUS | Status: DC
  Administered 2020-12-04 – 2020-12-05 (×3): 40 mg via INTRAVENOUS
  Filled 2020-12-04 (×3): qty 40

## 2020-12-04 MED ORDER — FLUOXETINE HCL 20 MG/5ML PO SOLN: 30.0000 mg | Freq: Every day | ORAL | Status: DC

## 2020-12-04 NOTE — Consult Note (Signed)
Referring Provider: ED Primary Care Physician:  Pcp, No Primary Gastroenterologist:  Dr. Therisa Doyne  Reason for Consultation:  Blood in G tube, melena  HPI: Theresa Stephens is a 70 y.o. 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  She reports several day history of blood coming of her G tube when she draws back. States she has seen black blood in the G tube as well as experiencing melena. Denies pain. Denies nausea and vomiting. Denies weight loss. Patient does not think she is on PPI daily. Denies NSAID use.  EGD 08/26/2019 done by Dr.Karki for blood coming out from G Tube. She presented for this encounter with hematemesis. Showed Red blood in the esophagus. - Clotted blood in the gastric fundus and in the gastric body. - Intact gastrostomy with a patent G-tube present characterized by healthy appearing mucosa. - Non-bleeding gastric ulcer with a visible vessel. Injected. Treated with bipolar cautery. Clips (MR conditional) were placed. - Normal examined duodenum. - No specimens collected.  No prior history of colonoscopy.  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    Past Surgical History:  Procedure Laterality Date   ESOPHAGOGASTRODUODENOSCOPY N/A 08/26/2019   Procedure: ESOPHAGOGASTRODUODENOSCOPY (EGD);  Surgeon: Ronnette Juniper, MD;  Location: [facility];  Service: Gastroenterology;  Laterality: N/A;   HEMOSTASIS CLIP PLACEMENT  08/26/2019   Procedure: HEMOSTASIS CLIP PLACEMENT;  Surgeon: Ronnette Juniper, MD;  Location: [place];  Service: Gastroenterology;;   HOT HEMOSTASIS N/A 08/26/2019   Procedure: HOT HEMOSTASIS (ARGON PLASMA  COAGULATION/BICAP);  Surgeon: Ronnette Juniper, MD;  Location: [place];  Service: Gastroenterology;  Laterality: N/A;   SCLEROTHERAPY  08/26/2019   Procedure: Clide Deutscher;  Surgeon: Ronnette Juniper, MD;  Location: [facility] ENDOSCOPY;  Service: Gastroenterology;;   TONGUE BIOPSY     TONSILLECTOMY      Prior to Admission medications   Medication Sig Start Date End Date Taking? Authorizing Provider  FLUoxetine (PROZAC) 20 MG/5ML solution Take 7.5 mLs (30 mg total) by mouth daily. 12/07/19 01/13/21 Yes Arrien, Jimmy Picket, MD  mirtazapine (REMERON) 15 MG tablet Place 7.5-15 mg into feeding tube at bedtime. 09/28/20  Yes [provider]    Scheduled Meds: Continuous Infusions: PRN Meds:.  Allergies as of 12/04/2020   (No Known Allergies)    Family History  Problem Relation Age of Onset   Peptic Ulcer Mother    Cancer Daughter 27       breast ca   Cancer Maternal Uncle        lung ca    Social History   Socioeconomic History   Marital status: Legally Separated    Spouse name: Not on file   Number of children: 5   Years of education: 12   Highest education level: High school graduate  Occupational History   Occupation: N/A  Tobacco Use   Smoking status: Former    Packs/day: 1.00    Years: 45.00    Pack years: 45.00    Types: Cigarettes    Quit date: 03/04/2013    Years since quitting: 7.7   Smokeless tobacco: Never   Tobacco comments:    stopped smoking 02/2013  Vaping Use  Vaping Use: Never used  Substance and Sexual Activity   Alcohol use: No   Drug use: No    Frequency: 2.0 times per week   Sexual activity: Not Currently  Other Topics Concern   Not on file  Social History Narrative   Not on file   Social Determinants of Health   Financial Resource Strain: Not on file  Food Insecurity: Not on file  Transportation Needs: Not on file  Physical Activity: Not on file  Stress: Not on file  Social Connections: Not on file  Intimate Partner Violence: Not  on file    Review of Systems: Review of Systems  Constitutional:  Negative for chills and fever.  HENT:  Negative for congestion and sore throat.   Eyes:  Negative for pain and redness.  Respiratory:  Negative for shortness of breath and wheezing.   Cardiovascular:  Negative for chest pain and palpitations.  Gastrointestinal:  Positive for melena. Negative for abdominal pain, blood in stool, constipation, diarrhea, heartburn, nausea and vomiting.  Genitourinary:  Negative for flank pain and hematuria.  Musculoskeletal:  Negative for falls and joint pain.  Skin:  Negative for itching and rash.  Neurological:  Negative for seizures and loss of consciousness.  Psychiatric/Behavioral:  Negative for substance abuse. The patient is not nervous/anxious.     Physical Exam:Physical Exam Constitutional:      General: She is not in acute distress.    Appearance: Normal appearance.  HENT:     Head: Normocephalic and atraumatic.     Nose: Nose normal. No congestion.     Mouth/Throat:     Mouth: Mucous membranes are moist.  Eyes:     General: No scleral icterus.    Extraocular Movements: Extraocular movements intact.     Conjunctiva/sclera: Conjunctivae normal.  Cardiovascular:     Rate and Rhythm: Normal rate and regular rhythm.  Pulmonary:     Effort: Pulmonary effort is normal. No respiratory distress.  Abdominal:     General: Abdomen is flat. Bowel sounds are normal. There is no distension.     Palpations: Abdomen is soft. There is no mass.     Tenderness: There is no abdominal tenderness. There is no guarding or rebound.     Hernia: No hernia is present.     Comments: Button G Tube  Musculoskeletal:        General: No swelling. Normal range of motion.     Cervical back: Normal range of motion and neck supple.  Skin:    General: Skin is warm and dry.     Coloration: Skin is not jaundiced.  Neurological:     General: No focal deficit present.     Mental Status: She is alert and  oriented to person, place, and time.  Psychiatric:        Mood and Affect: Mood normal.        Behavior: Behavior normal.        Thought Content: Thought content normal.    Vital signs: Vitals:   12/04/20 1400 12/04/20 1430  BP: (!) 141/77 138/82  Pulse: 72 73  Resp: (!) 22 (!) 23  Temp:    SpO2: 95% 96%        GI:  Lab Results: Recent Labs    12/04/20 1116  WBC 4.6  HGB 13.2  HCT 40.1  PLT 271   BMET Recent Labs    12/04/20 1116  NA 138  K 4.0  CL 98  CO2 30  GLUCOSE 111*  BUN 17  CREATININE 0.41*  CALCIUM 9.5   LFT Recent Labs    12/04/20 1116  PROT 7.8  ALBUMIN 4.6  AST 21  ALT 13  ALKPHOS 72  BILITOT 0.6   PT/INR No results for input(s): LABPROT, INR in the last 72 hours.   Studies/Results: No results found.  Impression:  Blood in G tube, Melena - HGB 13.2 - Normal renal function BUN 17, Cr 0.41 - Positive fecal occult  Plan: Stable hemoglobin, no anemia. Recommend conservative treatment at this time. Will hold off on EGD.  Start Protonix 40mg  IV BID, start carafate 1 g/mL suspension every 6 hrs  Continue clear liquid diet   Continue daily CBC with transfusion as needed to maintain Hgb >7.    [facility] will follow.      LOS: 0 days   Latrish Mogel Radford Pax  PA-C 12/04/2020, 2:37 PM  Contact #  [phone]

## 2020-12-04 NOTE — H&P (Signed)
History and Physical    Theresa Stephens ZOX:[id number] DOB: 11-Jan-1951 DOA: 12/04/2020  PCP: Pcp, No   Patient coming from: Home  I have personally briefly reviewed patient's old medical records in [place]  Chief Complaint: Blood in G-tube, melena  HPI: Theresa Stephens is a 70 y.o. 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  She states that she has been having blood coming from her G-tube for the last several days along with some black stools.  Denies worsening abdominal pain, nausea, vomiting, fever, chest pain, shortness of breath, palpitations, loss of consciousness or seizures.  Patient denies NSAID use.  ED Course: Hemoglobin was 13.2.  Fecal occult blood was positive.  GI was consulted who recommended conservative management with IV Protonix and oral Carafate and monitoring H&H and transfusion as needed. Hospitalist service was called to evaluate the patient.  Review of Systems: As per HPI otherwise all other systems were reviewed and are negative.   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    Past Surgical History:  Procedure Laterality Date   ESOPHAGOGASTRODUODENOSCOPY N/A 08/26/2019   Procedure: ESOPHAGOGASTRODUODENOSCOPY (EGD);  Surgeon: Ronnette Juniper, MD;  Location: [place];  Service: Gastroenterology;  Laterality: N/A;   HEMOSTASIS CLIP PLACEMENT  08/26/2019   Procedure: HEMOSTASIS CLIP PLACEMENT;  Surgeon: Ronnette Juniper, MD;  Location: [place];  Service: Gastroenterology;;   HOT HEMOSTASIS N/A 08/26/2019   Procedure: HOT HEMOSTASIS (ARGON PLASMA COAGULATION/BICAP);  Surgeon:  Ronnette Juniper, MD;  Location: [place];  Service: Gastroenterology;  Laterality: N/A;   SCLEROTHERAPY  08/26/2019   Procedure: SCLEROTHERAPY;  Surgeon: Ronnette Juniper, MD;  Location: [facility] ENDOSCOPY;  Service: Gastroenterology;;   TONGUE BIOPSY     TONSILLECTOMY     Social history  reports that she quit smoking about 7 years ago. Her smoking use included cigarettes. She has a 45.00 pack-year smoking history. She has never used smokeless tobacco. She reports that she does not drink alcohol and does not use drugs.  No Known Allergies  Family History  Problem Relation Age of Onset   Peptic Ulcer Mother    Cancer Daughter 36       breast ca   Cancer Maternal Uncle        lung ca    Prior to Admission medications   Medication Sig Start Date End Date Taking? Authorizing Provider  FLUoxetine (PROZAC) 20 MG/5ML solution Take 7.5 mLs (30 mg total) by mouth daily. 12/07/19 01/13/21 Yes Arrien, Jimmy Picket, MD  mirtazapine (REMERON) 15 MG tablet Place 7.5-15 mg into feeding tube at bedtime. 09/28/20  Yes [provider]    Physical Exam: Vitals:   12/04/20 1430 12/04/20 1606 12/04/20 1630 12/04/20 1700  BP: 138/82 134/84 134/73 139/82  Pulse: 73 74 71 70  Resp: (!) 23 20 19  (!) 25  Temp:      TempSrc:      SpO2: 96% 96% 96% 97%  Weight:      Height:        Constitutional: NAD, calm, comfortable.  Currently on room air. Vitals:   12/04/20 1430 12/04/20 1606 12/04/20 1630 12/04/20 1700  BP: 138/82 134/84 134/73  139/82  Pulse: 73 74 71 70  Resp: (!) 23 20 19  (!) 25  Temp:      TempSrc:      SpO2: 96% 96% 96% 97%  Weight:      Height:       Eyes: PERRL, lids and conjunctivae normal ENMT: Mucous membranes are moist. Posterior pharynx clear of any exudate or lesions. Neck: normal, supple, no masses, no thyromegaly Respiratory: bilateral decreased breath sounds at bases, no wheezing, no crackles.  Intermittently tachypneic.  No accessory muscle use.  Cardiovascular: S1 S2  positive, rate controlled. No extremity edema. 2+ pedal pulses.  Abdomen: no tenderness, no masses palpated. No hepatosplenomegaly. Bowel sounds positive.  G-tube present. Musculoskeletal: no clubbing / cyanosis. No joint deformity upper and lower extremities.  Skin: no rashes, lesions, ulcers. No induration Neurologic: CN 2-12 grossly intact. Moving extremities. No focal neurologic deficits.  Psychiatric: Normal judgment and insight. Alert and oriented x 3. Normal mood.    Labs on Admission: I have personally reviewed following labs and imaging studies  CBC: Recent Labs  Lab 12/04/20 1116  WBC 4.6  HGB 13.2  HCT 40.1  MCV 91.8  PLT 676   Basic Metabolic Panel: Recent Labs  Lab 12/04/20 1116  NA 138  K 4.0  CL 98  CO2 30  GLUCOSE 111*  BUN 17  CREATININE 0.41*  CALCIUM 9.5   GFR: Estimated Creatinine Clearance: 51.9 mL/min (A) (by C-G formula based on SCr of 0.41 mg/dL (L)). Liver Function Tests: Recent Labs  Lab 12/04/20 1116  AST 21  ALT 13  ALKPHOS 72  BILITOT 0.6  PROT 7.8  ALBUMIN 4.6   No results for input(s): LIPASE, AMYLASE in the last 168 hours. No results for input(s): AMMONIA in the last 168 hours. Coagulation Profile: No results for input(s): INR, PROTIME in the last 168 hours. Cardiac Enzymes: No results for input(s): CKTOTAL, CKMB, CKMBINDEX, TROPONINI in the last 168 hours. BNP (last 3 results) No results for input(s): PROBNP in the last 8760 hours. HbA1C: No results for input(s): HGBA1C in the last 72 hours. CBG: No results for input(s): GLUCAP in the last 168 hours. Lipid Profile: No results for input(s): CHOL, HDL, LDLCALC, TRIG, CHOLHDL, LDLDIRECT in the last 72 hours. Thyroid Function Tests: No results for input(s): TSH, T4TOTAL, FREET4, T3FREE, THYROIDAB in the last 72 hours. Anemia Panel: No results for input(s): VITAMINB12, FOLATE, FERRITIN, TIBC, IRON, RETICCTPCT in the last 72 hours. Urine analysis:    Component Value  Date/Time   COLORURINE YELLOW 11/30/2019 2228   APPEARANCEUR CLEAR 11/30/2019 2228   LABSPEC 1.040 (H) 11/30/2019 2228   PHURINE 8.0 11/30/2019 2228   GLUCOSEU NEGATIVE 11/30/2019 2228   HGBUR NEGATIVE 11/30/2019 2228   BILIRUBINUR NEGATIVE 11/30/2019 2228   KETONESUR NEGATIVE 11/30/2019 2228   PROTEINUR NEGATIVE 11/30/2019 2228   NITRITE NEGATIVE 11/30/2019 2228   LEUKOCYTESUR NEGATIVE 11/30/2019 2228    Radiological Exams on Admission: No results found.   Assessment/Plan  Possible upper GI bleed presenting with bleeding from G-tube and melena in the patient with history of GI bleeding -GI consultation appreciated.  Recommending conservative management with IV Protonix and oral Carafate and monitoring H&H.  Transfuse if hemoglobin is less than 7. -Hemoglobin currently 13.2. -We will keep n.p.o. in a.m. in case patient ends up requiring EGD. -Start gentle hydration.  History of oral squamous cell carcinoma status post surgical resection and G-tube placement -Outpatient follow-up with oncology -Hold G-tube feedings for now.  Depression -Continue  home regimen   DVT prophylaxis: SCDs Code Status: Full Family Communication: None at bedside Disposition Plan: Home in 1 to 2 days once clinically improves Consults called: GI called by EDP Admission status: Observation/MedSurg  Severity of Illness: The appropriate patient status for this patient is OBSERVATION. Observation status is judged to be reasonable and necessary in order to provide the required intensity of service to ensure the patient's safety. The patient's presenting symptoms, physical exam findings, and initial radiographic and laboratory data in the context of their medical condition is felt to place them at decreased risk for further clinical deterioration. Furthermore, it is anticipated that the patient will be medically stable for discharge from the hospital within 2 midnights of admission. The following factors  support the patient status of observation.   " The patient's presenting symptoms include bleeding from G-tube/melena. " The physical exam findings include mild anemia. " The initial radiographic and laboratory data are hemoglobin of 13.2.    Aline August MD [facility]  12/04/2020, 5:23 PM

## 2020-12-04 NOTE — ED Triage Notes (Addendum)
Patient's daughter reports that the patient has a feeding tube and reports that she noted dark blood when she aspirated instead of yellow stomach fluid. Patient c/o intermittent abdominal pain.

## 2020-12-04 NOTE — ED Provider Notes (Signed)
    Provider Note MRN:  [id number]  Arrival date & time: 12/04/20    ED Course and Medical Decision Making  Assumed care from Dr Hart Robinsons at shift change.  See note from Dr Jeanell Sparrow for full details.  31 female with hx UGI a/w gastric ulcer, follows with GI as outpatient. G tube 2/2 oral cancer. Noticed dark blood in G tube, dark stools. In ED hemoccult positive, hemodynamically positive. HGB 13.2. Pt evaluated by [facility] who recommends conservative management, serial H/h, admission. Pt is agreeable. D/w [facility] who accepts patient to inpatient service.   Final Clinical Impressions(s) / ED Diagnoses     ICD-10-CM   1. Upper GI bleed  K92.2       ED Discharge Orders     None       Discharge Instructions   None      Jeanell Sparrow, DO 12/04/20 1721

## 2020-12-04 NOTE — ED Notes (Signed)
Pt care taken, patient is playing solitare, no complaints at this time

## 2020-12-04 NOTE — ED Notes (Signed)
Ambulatory to bathroom with steady gait

## 2020-12-04 NOTE — ED Provider Notes (Signed)
[facility] DEPT Provider Note   CSN: [id number] Arrival date & time: 12/04/20  0957     History Chief Complaint  Patient presents with   blood in feeding tube    Theresa Stephens is a 70 y.o. female.  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  She reports that she has noted some blood coming out over the past several days when she draws back.  She has also noted some dark tarry stools.  She has not having any significant pain.  Denies lightheadedness or weakness.     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   GERD (gastroesophageal reflux disease) 12/01/2019   Pneumonia due to COVID-19 virus 12/01/2019   Swallowing dysfunction 12/01/2019   Oral cancer ([place]) 12/01/2019   Depression 12/01/2019   Acute hypoxemic respiratory failure due to COVID-19 ([place]) 11/30/2019   Iron deficiency anemia due to chronic blood loss 08/28/2019   Acute blood loss anemia 08/28/2019   UGIB (upper gastrointestinal bleed) 08/26/2019   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   Mouth pain 03/12/2013    Past Surgical History:  Procedure Laterality Date   ESOPHAGOGASTRODUODENOSCOPY N/A 08/26/2019   Procedure: ESOPHAGOGASTRODUODENOSCOPY (EGD);  Surgeon: Ronnette Juniper, MD;  Location: [place];  Service: Gastroenterology;  Laterality: N/A;   HEMOSTASIS CLIP PLACEMENT  08/26/2019   Procedure: HEMOSTASIS CLIP PLACEMENT;  Surgeon: Ronnette Juniper, MD;  Location: [place];  Service: Gastroenterology;;   HOT HEMOSTASIS N/A 08/26/2019   Procedure: HOT HEMOSTASIS (ARGON PLASMA COAGULATION/BICAP);  Surgeon: Ronnette Juniper, MD;  Location: [place];  Service: Gastroenterology;  Laterality: N/A;   SCLEROTHERAPY  08/26/2019   Procedure: Clide Deutscher;  Surgeon: Ronnette Juniper, MD;  Location: [facility] ENDOSCOPY;  Service: Gastroenterology;;   TONGUE BIOPSY     TONSILLECTOMY       OB History   No obstetric history on file.     Family History  Problem Relation Age of Onset   Peptic Ulcer Mother    Cancer Daughter 96       breast ca   Cancer Maternal Uncle        lung ca    Social History   Tobacco Use   Smoking status: Former    Packs/day: 1.00    Years: 45.00    Pack years: 45.00    Types: Cigarettes    Quit date: 03/04/2013    Years since quitting: 7.7   Smokeless tobacco: Never   Tobacco comments:    stopped smoking 02/2013  Vaping Use   Vaping Use: Never used  Substance Use Topics   Alcohol use: No   Drug use: No    Frequency: 2.0 times per week    Home Medications Prior to Admission medications   Medication Sig Start Date End Date Taking? Authorizing Provider  FLUoxetine (PROZAC) 20 MG/5ML solution Take 7.5 mLs (30 mg total) by mouth daily. 12/07/19 01/13/21 Yes Arrien, Jimmy Picket, MD  mirtazapine (REMERON) 15 MG tablet Place 7.5-15 mg into  feeding tube at bedtime. 09/28/20  Yes [provider]    Allergies    Patient has no known allergies.  Review of Systems   Review of Systems  All other systems reviewed and are negative.  Physical Exam Updated Vital Signs BP (!) 141/77   Pulse 72   Temp 97.8 F (36.6 C) (Oral)   Resp (!) 22   Ht 1.499 m (4\' 11" )   Wt 62.6 kg   SpO2 95%   BMI 27.87 kg/m   Physical Exam Vitals reviewed.  HENT:     Head: Normocephalic.     Right Ear: External ear normal.     Left Ear: External ear normal.     Nose: Nose normal.     Mouth/Throat:     Comments: Prior oral surgery  noted Eyes:     Extraocular Movements: Extraocular movements intact.     Pupils: Pupils are equal, round, and reactive to light.  Cardiovascular:     Rate and Rhythm: Normal rate and regular rhythm.     Pulses: Normal pulses.  Pulmonary:     Effort: Pulmonary effort is normal.  Abdominal:     General: Abdomen is flat.     Palpations: Abdomen is soft.  Musculoskeletal:        General: Normal range of motion.     Cervical back: Normal range of motion.  Skin:    General: Skin is warm.     Capillary Refill: Capillary refill takes less than 2 seconds.  Neurological:     General: No focal deficit present.     Mental Status: She is alert.  Psychiatric:        Mood and Affect: Mood normal.    ED Results / Procedures / Treatments   Labs (all labs ordered are listed, but only abnormal results are displayed) Labs Reviewed  COMPREHENSIVE METABOLIC PANEL - Abnormal; Notable for the following components:      Result Value   Glucose, Bld 111 (*)    Creatinine, Ser 0.41 (*)    All other components within normal limits  POC OCCULT BLOOD, ED - Abnormal; Notable for the following components:   Fecal Occult Bld POSITIVE (*)    All other components within normal limits  CBC  POC OCCULT BLOOD, ED  TYPE AND SCREEN    EKG None  Radiology No results found.  Procedures Procedures   Medications Ordered in ED Medications - No data to display  ED Course  I have reviewed the triage vital signs and the nursing notes.  Pertinent labs & imaging results that were available during my care of the patient were reviewed by me and considered in my medical decision making (see chart for details).    MDM Rules/Calculators/A&P                           70 year old female with G-tube secondary to mouth cancer, history of upper GI bleed secondary to gastric ulcer, presents today complaining of blood from her G-tube.  Here in the ED she is hemodynamically stable, hemoglobin is 13.2, Hemoccult is  positive. Awaiting gastroenterology input Probable admission to hospitalist team Discussed with Dr. Pearline Cables, who will assume patient and disposition appropriately  Final Clinical Impression(s) / ED Diagnoses Final diagnoses:  Upper GI bleed    Rx / DC Orders ED Discharge Orders     None        Pattricia Boss, MD 12/04/20 1536

## 2020-12-05 DIAGNOSIS — C069 Malignant neoplasm of mouth, unspecified: Secondary | ICD-10-CM | POA: Diagnosis not present

## 2020-12-05 DIAGNOSIS — F330 Major depressive disorder, recurrent, mild: Secondary | ICD-10-CM | POA: Diagnosis not present

## 2020-12-05 DIAGNOSIS — K922 Gastrointestinal hemorrhage, unspecified: Secondary | ICD-10-CM | POA: Diagnosis not present

## 2020-12-05 DIAGNOSIS — K9421 Gastrostomy hemorrhage: Secondary | ICD-10-CM | POA: Diagnosis not present

## 2020-12-05 LAB — CBC
HCT: 34.4 % — ABNORMAL LOW (ref 36.0–46.0)
Hemoglobin: 11.6 g/dL — ABNORMAL LOW (ref 12.0–15.0)
MCH: 30.4 pg (ref 26.0–34.0)
MCHC: 33.7 g/dL (ref 30.0–36.0)
MCV: 90.1 fL (ref 80.0–100.0)
Platelets: 212 10*3/uL (ref 150–400)
RBC: 3.82 MIL/uL — ABNORMAL LOW (ref 3.87–5.11)
RDW: 12.7 % (ref 11.5–15.5)
WBC: 3.6 10*3/uL — ABNORMAL LOW (ref 4.0–10.5)
nRBC: 0 % (ref 0.0–0.2)

## 2020-12-05 LAB — PROTIME-INR
INR: 1 (ref 0.8–1.2)
Prothrombin Time: 12.7 seconds (ref 11.4–15.2)

## 2020-12-05 LAB — BASIC METABOLIC PANEL
Anion gap: 5 (ref 5–15)
BUN: 15 mg/dL (ref 8–23)
CO2: 30 mmol/L (ref 22–32)
Calcium: 9.1 mg/dL (ref 8.9–10.3)
Chloride: 106 mmol/L (ref 98–111)
Creatinine, Ser: 0.42 mg/dL — ABNORMAL LOW (ref 0.44–1.00)
GFR, Estimated: >60 mL/min (ref >60)
Glucose, Bld: 93 mg/dL (ref 70–99)
Potassium: 3.6 mmol/L (ref 3.5–5.1)
Sodium: 141 mmol/L (ref 135–145)

## 2020-12-05 LAB — HEMOGLOBIN AND HEMATOCRIT, BLOOD
HCT: 33.7 % — ABNORMAL LOW (ref 36.0–46.0)
Hemoglobin: 11.2 g/dL — ABNORMAL LOW (ref 12.0–15.0)

## 2020-12-05 MED ORDER — ONDANSETRON HCL 4 MG PO TABS: 4.0000 mg | ORAL_TABLET | Freq: Four times a day (QID) | ORAL | 0 refills | Status: DC | PRN

## 2020-12-05 MED ORDER — SUCRALFATE 1 GM/10ML PO SUSP
1.0000 g | Freq: Four times a day (QID) | ORAL | 0 refills | Status: DC
Start: 2020-12-05 — End: 2022-10-03

## 2020-12-05 MED ORDER — PANTOPRAZOLE SODIUM 40 MG PO TBEC: 40.0000 mg | DELAYED_RELEASE_TABLET | Freq: Two times a day (BID) | ORAL | 0 refills | Status: DC

## 2020-12-05 NOTE — Progress Notes (Signed)
Nutrition Brief Note  RD consulted to resume tube feeds.   Noted that pt has discharge order/summary placed. Per RN note, pt declines RD visit and will resume home TF once discharged.  Wt Readings from Last 15 Encounters:  12/04/20 62.6 kg  12/20/19 58.1 kg  12/01/19 59.2 kg  08/28/19 61.2 kg  12/22/15 58.3 kg  03/16/15 56.7 kg  04/22/14 56.8 kg  12/31/13 59 kg  09/08/13 59.4 kg  08/25/13 57.4 kg  08/16/13 58.7 kg  08/09/13 60.6 kg  08/06/13 60.6 kg  07/26/13 61.2 kg  07/19/13 61.7 kg    Body mass index is 27.87 kg/m. Patient meets criteria for overweight based on current BMI.    Labs and medications reviewed.   No nutrition interventions warranted at this time. If RD is needed or pt does not end up discharging, please consult RD.   Theresa Bibles, MS, RD, LDN Inpatient Clinical Dietitian Contact information available via Amion

## 2020-12-05 NOTE — Progress Notes (Signed)
Pt was consulted for the dietitian but pt said she wanted to resume her own feeding and daughter agreed with that they donot want to see dietitian. Explained the discharge instruction with the daughter. No concerns or questions.MD notified. Pt left the unit in wheelchair.

## 2020-12-05 NOTE — Discharge Summary (Signed)
Physician Discharge Summary  Theresa Stephens GMW:[id number] DOB: [birth year] DOA: 12/04/2020  PCP: Pcp, No  Admit date: 12/04/2020 Discharge date: 12/05/2020  Admitted From: Home Disposition: Home  Recommendations for Outpatient Follow-up:  Follow up with PCP in 1 week with repeat CBC/BMP Outpatient follow-up with GI Follow up in ED if symptoms worsen or new appear   Home Health: No Equipment/Devices: None  Discharge Condition: Stable CODE STATUS: Full Diet recommendation: Tube feeding diet  Brief/Interim Summary: 70 y.o. 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  Hemoglobin was 13.2 on presentation.  GI recommended conservative management with IV Protonix and oral Carafate and H&H monitoring.  During the hospitalization, she has not had any more bleeding from G-tube.  Hemoglobin is stable.  GI has cleared the patient for discharge on Protonix and Carafate.  Will resume diet today and discharge her afterwards if she remains stable.  Outpatient follow-up with PCP and GI.  Discharge Diagnoses:   Possible upper GI bleed presenting with bleeding from G-tube and melena in the patient with history of GI bleeding - GI recommended conservative management with IV Protonix and oral Carafate and H&H monitoring. -Hemoglobin 13.2 on presentation.  Hemoglobin 11.2 this morning. -No further bleeding from G-tube.  No bowel movement since admission -GI has cleared the patient for discharge on oral Protonix 40 mg twice daily for 6 weeks then decrease to once daily dosing thereafter along with Carafate twice daily for 2 weeks.  Outpatient follow-up with GI. -Resume tube feeding diet today.  Discharge patient home later today with outpatient follow-up with PCP with repeat CBC in  a week.  History of oral squamous cell carcinoma  status post surgical resection and G-tube placement -Outpatient follow-up with oncology -Resume tube feedings.  Depression -Continue home regimen   Discharge Instructions   Allergies as of 12/05/2020   No Known Allergies      Medication List     TAKE these medications    FLUoxetine 20 MG/5ML solution Commonly known as: PROZAC Take 7.5 mLs (30 mg total) by mouth daily.   mirtazapine 15 MG tablet Commonly known as: REMERON Place 7.5-15 mg into feeding tube at bedtime.   ondansetron 4 MG tablet Commonly known as: ZOFRAN Place 1 tablet (4 mg total) into feeding tube every 6 (six) hours as needed for nausea.   pantoprazole 40 MG tablet Commonly known as: Protonix Take 1 tablet (40 mg total) by mouth 2 (two) times daily before a meal.   sucralfate 1 GM/10ML suspension Commonly known as: CARAFATE Place 10 mLs (1 g total) into feeding tube every 6 (six) hours.         Follow-up Information     PCP. Schedule an appointment as soon as possible for a visit in 1 week(s).   Why: with repeat cbc/bmp        Arta Silence, MD. Schedule an appointment as soon as possible for a visit in 1 week(s).   Specialty: Gastroenterology Contact information: [address] [place] [address] [phone]                No Known Allergies  Consultations: GI   Procedures/Studies: No results found.    Subjective: Patient seen and examined at bedside.  No bleeding from G-tube or black/bloody stools reported since admission.  No fever, vomiting reported as well.  Discharge Exam: Vitals:  12/05/20 0156 12/05/20 0459  BP: 139/61 126/72  Pulse: 66 65  Resp: 15 16  Temp: 97.8 F (36.6 C) 97.7 F (36.5 C)  SpO2: 96% 99%    General: Pt is alert, awake, not in acute distress.  Poor historian.  On room air. Cardiovascular: rate controlled, S1/S2 + Respiratory: bilateral decreased breath sounds at bases Abdominal: Soft, NT, ND, bowel sounds +;  G-tube present Extremities: no edema, no cyanosis    The results of significant diagnostics from this hospitalization (including imaging, microbiology, ancillary and laboratory) are listed below for reference.     Microbiology: Recent Results (from the past 240 hour(s))  Resp Panel by RT-PCR (Flu A&B, Covid) Nasopharyngeal Swab     Status: None   Collection Time: 12/04/20  2:14 PM   Specimen: Nasopharyngeal Swab; Nasopharyngeal(NP) swabs in vial transport medium  Result Value Ref Range Status   SARS Coronavirus 2 by RT PCR NEGATIVE NEGATIVE Final    Comment: (NOTE) SARS-CoV-2 target nucleic acids are NOT DETECTED.  The SARS-CoV-2 RNA is generally detectable in upper respiratory specimens during the acute phase of infection. The lowest concentration of SARS-CoV-2 viral copies this assay can detect is 138 copies/mL. A negative result does not preclude SARS-Cov-2 infection and should not be used as the sole basis for treatment or other patient management decisions. A negative result may occur with  improper specimen collection/handling, submission of specimen other than nasopharyngeal swab, presence of viral mutation(s) within the areas targeted by this assay, and inadequate number of viral copies(<138 copies/mL). A negative result must be combined with clinical observations, patient history, and epidemiological information. The expected result is Negative.  Fact Sheet for Patients:  EntrepreneurPulse.com.au  Fact Sheet for Healthcare Providers:  IncredibleEmployment.be  This test is no t yet approved or cleared by the [place] FDA and  has been authorized for detection and/or diagnosis of SARS-CoV-2 by FDA under an Emergency Use Authorization (EUA). This EUA will remain  in effect (meaning this test can be used) for the duration of the COVID-19 declaration under Section 564(b)(1) of the Act, 21 U.S.C.section 360bbb-3(b)(1), unless the  authorization is terminated  or revoked sooner.       Influenza A by PCR NEGATIVE NEGATIVE Final   Influenza B by PCR NEGATIVE NEGATIVE Final    Comment: (NOTE) The Xpert Xpress SARS-CoV-2/FLU/RSV plus assay is intended as an aid in the diagnosis of influenza from Nasopharyngeal swab specimens and should not be used as a sole basis for treatment. Nasal washings and aspirates are unacceptable for Xpert Xpress SARS-CoV-2/FLU/RSV testing.  Fact Sheet for Patients: EntrepreneurPulse.com.au  Fact Sheet for Healthcare Providers: IncredibleEmployment.be  This test is not yet approved or cleared by the [place] FDA and has been authorized for detection and/or diagnosis of SARS-CoV-2 by FDA under an Emergency Use Authorization (EUA). This EUA will remain in effect (meaning this test can be used) for the duration of the COVID-19 declaration under Section 564(b)(1) of the Act, 21 U.S.C. section 360bbb-3(b)(1), unless the authorization is terminated or revoked.  Performed at [facility], [place] [address], [place], [place] [address]      Labs: BNP (last 3 results) No results for input(s): BNP in the last 8760 hours. Basic Metabolic Panel: Recent Labs  Lab 12/04/20 1116 12/05/20 0101  NA 138 141  K 4.0 3.6  CL 98 106  CO2 30 30  GLUCOSE 111* 93  BUN 17 15  CREATININE 0.41* 0.42*  CALCIUM 9.5 9.1  Liver Function Tests: Recent Labs  Lab 12/04/20 1116  AST 21  ALT 13  ALKPHOS 72  BILITOT 0.6  PROT 7.8  ALBUMIN 4.6   No results for input(s): LIPASE, AMYLASE in the last 168 hours. No results for input(s): AMMONIA in the last 168 hours. CBC: Recent Labs  Lab 12/04/20 1116 12/04/20 1808 12/05/20 0101 12/05/20 0931  WBC 4.6  --  3.6*  --   HGB 13.2 11.6* 11.6* 11.2*  HCT 40.1 34.9* 34.4* 33.7*  MCV 91.8  --  90.1  --   PLT 271  --  212  --    Cardiac Enzymes: No results for input(s): CKTOTAL, CKMB,  CKMBINDEX, TROPONINI in the last 168 hours. BNP: Invalid input(s): POCBNP CBG: No results for input(s): GLUCAP in the last 168 hours. D-Dimer No results for input(s): DDIMER in the last 72 hours. Hgb A1c No results for input(s): HGBA1C in the last 72 hours. Lipid Profile No results for input(s): CHOL, HDL, LDLCALC, TRIG, CHOLHDL, LDLDIRECT in the last 72 hours. Thyroid function studies No results for input(s): TSH, T4TOTAL, T3FREE, THYROIDAB in the last 72 hours.  Invalid input(s): FREET3 Anemia work up No results for input(s): VITAMINB12, FOLATE, FERRITIN, TIBC, IRON, RETICCTPCT in the last 72 hours. Urinalysis    Component Value Date/Time   COLORURINE YELLOW 11/30/2019 2228   APPEARANCEUR CLEAR 11/30/2019 2228   LABSPEC 1.040 (H) 11/30/2019 2228   PHURINE 8.0 11/30/2019 2228   GLUCOSEU NEGATIVE 11/30/2019 2228   HGBUR NEGATIVE 11/30/2019 2228   BILIRUBINUR NEGATIVE 11/30/2019 2228   KETONESUR NEGATIVE 11/30/2019 2228   PROTEINUR NEGATIVE 11/30/2019 2228   NITRITE NEGATIVE 11/30/2019 2228   LEUKOCYTESUR NEGATIVE 11/30/2019 2228   Sepsis Labs Invalid input(s): PROCALCITONIN,  WBC,  LACTICIDVEN Microbiology Recent Results (from the past 240 hour(s))  Resp Panel by RT-PCR (Flu A&B, Covid) Nasopharyngeal Swab     Status: None   Collection Time: 12/04/20  2:14 PM   Specimen: Nasopharyngeal Swab; Nasopharyngeal(NP) swabs in vial transport medium  Result Value Ref Range Status   SARS Coronavirus 2 by RT PCR NEGATIVE NEGATIVE Final    Comment: (NOTE) SARS-CoV-2 target nucleic acids are NOT DETECTED.  The SARS-CoV-2 RNA is generally detectable in upper respiratory specimens during the acute phase of infection. The lowest concentration of SARS-CoV-2 viral copies this assay can detect is 138 copies/mL. A negative result does not preclude SARS-Cov-2 infection and should not be used as the sole basis for treatment or other patient management decisions. A negative result may occur  with  improper specimen collection/handling, submission of specimen other than nasopharyngeal swab, presence of viral mutation(s) within the areas targeted by this assay, and inadequate number of viral copies(<138 copies/mL). A negative result must be combined with clinical observations, patient history, and epidemiological information. The expected result is Negative.  Fact Sheet for Patients:  EntrepreneurPulse.com.au  Fact Sheet for Healthcare Providers:  IncredibleEmployment.be  This test is no t yet approved or cleared by the [place] FDA and  has been authorized for detection and/or diagnosis of SARS-CoV-2 by FDA under an Emergency Use Authorization (EUA). This EUA will remain  in effect (meaning this test can be used) for the duration of the COVID-19 declaration under Section 564(b)(1) of the Act, 21 U.S.C.section 360bbb-3(b)(1), unless the authorization is terminated  or revoked sooner.       Influenza A by PCR NEGATIVE NEGATIVE Final   Influenza B by PCR NEGATIVE NEGATIVE Final    Comment: (NOTE) The Xpert Xpress  SARS-CoV-2/FLU/RSV plus assay is intended as an aid in the diagnosis of influenza from Nasopharyngeal swab specimens and should not be used as a sole basis for treatment. Nasal washings and aspirates are unacceptable for Xpert Xpress SARS-CoV-2/FLU/RSV testing.  Fact Sheet for Patients: EntrepreneurPulse.com.au  Fact Sheet for Healthcare Providers: IncredibleEmployment.be  This test is not yet approved or cleared by the [place] FDA and has been authorized for detection and/or diagnosis of SARS-CoV-2 by FDA under an Emergency Use Authorization (EUA). This EUA will remain in effect (meaning this test can be used) for the duration of the COVID-19 declaration under Section 564(b)(1) of the Act, 21 U.S.C. section 360bbb-3(b)(1), unless the authorization is terminated  or revoked.  Performed at [facility], [place] [address] [place], [place] [address]      Time coordinating discharge: 35 minutes  SIGNED:   Aline August, MD  [facility] 12/05/2020, 10:57 AM

## 2020-12-05 NOTE — Progress Notes (Signed)
[facility] Gastroenterology Progress Note  Theresa Stephens [id number] 70 y.o. 1950-11-23  CC:  Blood in G-tube, melena   Subjective: Patient denies complaints. No further blood in G tube.  Has not had a BM since admission. No abdominal pain, nausea/vomiting.  ROS : Review of Systems  Cardiovascular:  Negative for chest pain and palpitations.  Gastrointestinal:  Negative for abdominal pain, blood in stool, constipation, diarrhea, heartburn, melena, nausea and vomiting.     Objective: Vital signs in last 24 hours: Vitals:   12/05/20 0156 12/05/20 0459  BP: 139/61 126/72  Pulse: 66 65  Resp: 15 16  Temp: 97.8 F (36.6 C) 97.7 F (36.5 C)  SpO2: 96% 99%    Physical Exam:  General:  Alert, cooperative, no distress  Head:  Normocephalic, without obvious abnormality, atraumatic  Eyes:  Anicteric sclera, EOMs intact  Lungs:   Respirations unlabored  Heart:  Regular rate and rhythm, S1, S2 normal  Abdomen:   Soft, non-tender, non-distended, normoactive bowel sounds, normal-appearing G-tube    Lab Results: Recent Labs    12/04/20 1116 12/05/20 0101  NA 138 141  K 4.0 3.6  CL 98 106  CO2 30 30  GLUCOSE 111* 93  BUN 17 15  CREATININE 0.41* 0.42*  CALCIUM 9.5 9.1   Recent Labs    12/04/20 1116  AST 21  ALT 13  ALKPHOS 72  BILITOT 0.6  PROT 7.8  ALBUMIN 4.6   Recent Labs    12/04/20 1116 12/04/20 1808 12/05/20 0101 12/05/20 0931  WBC 4.6  --  3.6*  --   HGB 13.2   < > 11.6* 11.2*  HCT 40.1   < > 34.4* 33.7*  MCV 91.8  --  90.1  --   PLT 271  --  212  --    < > = values in this interval not displayed.   Recent Labs    12/05/20 0101  LABPROT 12.7  INR 1.0      Assessment: Blood in G-tube, melena: resolved -History of gastric ulcer and H pylori infection s/p treatment (and subsequent negative stool testing 01/2020) -Hgb 11.2, stable as compared to 11.6 yesterday -Normal BUN/Cr  Plan: Agree with resumption of tube feeds.  If no further signs of bleeding, OK  to discharge from GI standpoint.  Continue Protonix 40 mg BID x 6 weeks, then decrease to once daily dosing thereafter.  OK to continue Carafate BID x 2 weeks.  FU with Dr. Acie Fredrickson GI in 6-8 weeks.    [facility] will sign off. Please contact us if we can be of any further assistance during this hospital stay.  Salley Slaughter PA-C 12/05/2020, 10:50 AM  Contact #  [phone]

## 2020-12-05 NOTE — Plan of Care (Signed)
  Problem: Education: Goal: Knowledge of General Education information will improve Description: Including pain rating scale, medication(s)/side effects and non-pharmacologic comfort measures Outcome: Progressing   Problem: Health Behavior/Discharge Planning: Goal: Ability to manage health-related needs will improve Outcome: Progressing   Problem: Clinical Measurements: Goal: Ability to maintain clinical measurements within normal limits will improve Outcome: Progressing Goal: Will remain free from infection Outcome: Progressing Goal: Diagnostic test results will improve Outcome: Progressing Goal: Respiratory complications will improve Outcome: Progressing Goal: Cardiovascular complication will be avoided Outcome: Progressing   Problem: Nutrition: Goal: Adequate nutrition will be maintained Outcome: Progressing   Problem: Education: Goal: Knowledge of General Education information will improve Description: Including pain rating scale, medication(s)/side effects and non-pharmacologic comfort measures Outcome: Progressing   Problem: Health Behavior/Discharge Planning: Goal: Ability to manage health-related needs will improve Outcome: Progressing   Problem: Clinical Measurements: Goal: Ability to maintain clinical measurements within normal limits will improve Outcome: Progressing Goal: Will remain free from infection Outcome: Progressing Goal: Diagnostic test results will improve Outcome: Progressing Goal: Respiratory complications will improve Outcome: Progressing Goal: Cardiovascular complication will be avoided Outcome: Progressing   Problem: Nutrition: Goal: Adequate nutrition will be maintained Outcome: Progressing

## 2022-10-01 ENCOUNTER — Other Ambulatory Visit: Payer: Self-pay

## 2022-10-01 ENCOUNTER — Encounter (HOSPITAL_BASED_OUTPATIENT_CLINIC_OR_DEPARTMENT_OTHER): Payer: Self-pay | Admitting: Emergency Medicine

## 2022-10-01 ENCOUNTER — Emergency Department (HOSPITAL_BASED_OUTPATIENT_CLINIC_OR_DEPARTMENT_OTHER): Payer: 59

## 2022-10-01 ENCOUNTER — Observation Stay (HOSPITAL_BASED_OUTPATIENT_CLINIC_OR_DEPARTMENT_OTHER)
Admission: EM | Admit: 2022-10-01 | Discharge: 2022-10-03 | Disposition: A | Discharge status: Home / Self Care | Payer: 59 | Attending: Emergency Medicine | Admitting: Emergency Medicine

## 2022-10-01 DIAGNOSIS — D649 Anemia, unspecified: Secondary | ICD-10-CM

## 2022-10-01 DIAGNOSIS — K922 Gastrointestinal hemorrhage, unspecified: Secondary | ICD-10-CM | POA: Diagnosis present

## 2022-10-01 DIAGNOSIS — Z85828 Personal history of other malignant neoplasm of skin: Secondary | ICD-10-CM | POA: Diagnosis not present

## 2022-10-01 DIAGNOSIS — D500 Iron deficiency anemia secondary to blood loss (chronic): Principal | ICD-10-CM | POA: Insufficient documentation

## 2022-10-01 DIAGNOSIS — F32A Depression, unspecified: Secondary | ICD-10-CM | POA: Insufficient documentation

## 2022-10-01 DIAGNOSIS — Z87891 Personal history of nicotine dependence: Secondary | ICD-10-CM | POA: Diagnosis not present

## 2022-10-01 DIAGNOSIS — Z931 Gastrostomy status: Secondary | ICD-10-CM | POA: Diagnosis not present

## 2022-10-01 LAB — CBC WITH DIFFERENTIAL/PLATELET
Abs Immature Granulocytes: 0.02 10*3/uL (ref 0.00–0.07)
Basophils Absolute: 0 10*3/uL (ref 0.0–0.1)
Basophils Relative: 1 %
Eosinophils Absolute: 0.1 10*3/uL (ref 0.0–0.5)
Eosinophils Relative: 2 %
HCT: 26.1 % — ABNORMAL LOW (ref 36.0–46.0)
Hemoglobin: 8.6 g/dL — ABNORMAL LOW (ref 12.0–15.0)
Immature Granulocytes: 0 %
Lymphocytes Relative: 19 %
Lymphs Abs: 1.1 10*3/uL (ref 0.7–4.0)
MCH: 30.9 pg (ref 26.0–34.0)
MCHC: 33 g/dL (ref 30.0–36.0)
MCV: 93.9 fL (ref 80.0–100.0)
Monocytes Absolute: 0.5 10*3/uL (ref 0.1–1.0)
Monocytes Relative: 8 %
Neutro Abs: 4 10*3/uL (ref 1.7–7.7)
Neutrophils Relative %: 70 %
Platelets: 255 10*3/uL (ref 150–400)
RBC: 2.78 MIL/uL — ABNORMAL LOW (ref 3.87–5.11)
RDW: 13.6 % (ref 11.5–15.5)
WBC: 5.7 10*3/uL (ref 4.0–10.5)
nRBC: 0 % (ref 0.0–0.2)

## 2022-10-01 LAB — COMPREHENSIVE METABOLIC PANEL
ALT: 15 U/L (ref 0–44)
AST: 24 U/L (ref 15–41)
Albumin: 4.4 g/dL (ref 3.5–5.0)
Alkaline Phosphatase: 67 U/L (ref 38–126)
Anion gap: 8 (ref 5–15)
BUN: 23 mg/dL (ref 8–23)
CO2: 28 mmol/L (ref 22–32)
Calcium: 9.4 mg/dL (ref 8.9–10.3)
Chloride: 103 mmol/L (ref 98–111)
Creatinine, Ser: 0.56 mg/dL (ref 0.44–1.00)
GFR, Estimated: >60 mL/min (ref >60)
Glucose, Bld: 114 mg/dL — ABNORMAL HIGH (ref 70–99)
Potassium: 4.2 mmol/L (ref 3.5–5.1)
Sodium: 139 mmol/L (ref 135–145)
Total Bilirubin: 0.3 mg/dL (ref 0.3–1.2)
Total Protein: 7.1 g/dL (ref 6.5–8.1)

## 2022-10-01 LAB — URINALYSIS, ROUTINE W REFLEX MICROSCOPIC
Bilirubin Urine: NEGATIVE
Glucose, UA: NEGATIVE mg/dL
Hgb urine dipstick: NEGATIVE
Ketones, ur: NEGATIVE mg/dL
Leukocytes,Ua: NEGATIVE
Nitrite: NEGATIVE
Specific Gravity, Urine: 1.022 (ref 1.005–1.030)
pH: 6.5 (ref 5.0–8.0)

## 2022-10-01 LAB — TYPE AND SCREEN

## 2022-10-01 LAB — LIPASE, BLOOD: Lipase: 24 U/L (ref 11–51)

## 2022-10-01 LAB — OCCULT BLOOD X 1 CARD TO LAB, STOOL: Fecal Occult Bld: POSITIVE — AB

## 2022-10-01 MED ORDER — PANTOPRAZOLE SODIUM 40 MG IV SOLR
40.0000 mg | Freq: Once | INTRAVENOUS | Status: AC
  Administered 2022-10-01: 40 mg via INTRAVENOUS
  Filled 2022-10-01: qty 10

## 2022-10-01 MED ORDER — FAMOTIDINE IN NACL 20-0.9 MG/50ML-% IV SOLN: 20.0000 mg | Freq: Once | INTRAVENOUS | Status: DC

## 2022-10-01 NOTE — Plan of Care (Signed)
Theresa Stephens is a 72 y.o. female history of GI bleed, ileostomy presented after having melena in her ostomy tube for the past 4 days.  Patient on Friday she began having black stool, out of her ostomy tube and the caregiver was present to confirm.  Patient dates that today her stool has been dark red and not melanotic.  Patient called her GI specialist Dr. Marca Ancona and was referred to ER for further workup.  Patient is not on any blood thinners but does note that she does feel fatigued since Friday.  Patient denies any nausea/vomiting.  Patient still has bowel movements and notes that those stools have been dark as well.  Her hemoglobin was 11.2 about a year ago and now 8.6.  EDP discussed with Dr. Levora Angel of [facility] who recommended admission and they will see the patient.  Patient has been accepted under [facility] as observation status.  EDP to remain responsible for patient care until patient arrives to either [facility] or [facility].  Patient hemodynamically stable at ED.

## 2022-10-01 NOTE — ED Triage Notes (Signed)
Patient sent here by PCP for possible upper GI bleed because of dark stools and blood coming out of feeding tube since Friday.  Patient has not seen blood in feeding tube today.

## 2022-10-01 NOTE — ED Notes (Signed)
Kim with cl called for transport

## 2022-10-01 NOTE — ED Provider Notes (Addendum)
[place] EMERGENCY DEPARTMENT AT [facility] Provider Note   CSN: [id number] Arrival date & time: 10/01/22  1354     History  Chief Complaint  Patient presents with   GI Bleeding    Theresa Stephens is a 72 y.o. female history of GI bleed, ileostomy presented after having melena in her ostomy tube for the past 4 days.  Patient on Friday she began having black stool, out of her ostomy tube and the caregiver was present to confirm.  Patient dates that today her stool has been dark red and not melanotic.  Patient called her GI specialist Dr. Pati Gallo and was referred to ER for further workup.  Patient that she is not on any blood thinners but does note that she does feel fatigued since Friday.  Patient denies any nausea/vomiting.  Patient still has bowel movements and notes that those stools have been dark as well.  Patient denies chest pain, shortness of breath, abdominal pain, change in sensation/motor skills  Home Medications Prior to Admission medications   Medication Sig Start Date End Date Taking? Authorizing Provider  FLUoxetine (PROZAC) 20 MG/5ML solution Take 7.5 mLs (30 mg total) by mouth daily. 12/07/19 01/13/21  Arrien, York Ram, MD  mirtazapine (REMERON) 15 MG tablet Place 7.5-15 mg into feeding tube at bedtime. 09/28/20   [provider]  ondansetron (ZOFRAN) 4 MG tablet Place 1 tablet (4 mg total) into feeding tube every 6 (six) hours as needed for nausea. 12/05/20   Glade Lloyd, MD  pantoprazole (PROTONIX) 40 MG tablet Take 1 tablet (40 mg total) by mouth 2 (two) times daily before a meal. 12/05/20 01/04/21  Glade Lloyd, MD  sucralfate (CARAFATE) 1 GM/10ML suspension Place 10 mLs (1 g total) into feeding tube every 6 (six) hours. 12/05/20   Glade Lloyd, MD      Allergies    Patient has no known allergies.    Review of Systems   Review of Systems She was really Physical Exam Updated Vital Signs BP 108/73 (BP Location: Left Arm)   Pulse 82    Temp 97.6 F (36.4 C) (Oral)   Resp 15   Ht 4\' 11"  (1.499 m)   Wt 60.8 kg   SpO2 96%   BMI 27.06 kg/m  Physical Exam Vitals reviewed.  Constitutional:      General: She is not in acute distress. HENT:     Head: Normocephalic and atraumatic.  Eyes:     Extraocular Movements: Extraocular movements intact.     Conjunctiva/sclera: Conjunctivae normal.     Pupils: Pupils are equal, round, and reactive to light.  Cardiovascular:     Rate and Rhythm: Normal rate and regular rhythm.     Pulses: Normal pulses.     Heart sounds: Normal heart sounds.     Comments: 2+ bilateral radial/dorsalis pedis pulses with regular rate Pulmonary:     Effort: Pulmonary effort is normal. No respiratory distress.     Breath sounds: Normal breath sounds.  Abdominal:     Palpations: Abdomen is soft.     Tenderness: There is no abdominal tenderness. There is no guarding or rebound.  Musculoskeletal:        General: Normal range of motion.     Cervical back: Normal range of motion and neck supple.     Comments: 5 out of 5 bilateral grip/leg extension strength  Skin:    General: Skin is warm and dry.     Capillary Refill: Capillary refill takes less  than 2 seconds.     Comments: Ileostomy site does not appear erythematous, no blood noted in or around ileostomy tube  Neurological:     General: No focal deficit present.     Mental Status: She is alert and oriented to person, place, and time.     Comments: Sensation intact in all 4 limbs  Psychiatric:        Mood and Affect: Mood normal.     ED Results / Procedures / Treatments   Labs (all labs ordered are listed, but only abnormal results are displayed) Labs Reviewed  CBC WITH DIFFERENTIAL/PLATELET - Abnormal; Notable for the following components:      Result Value   RBC 2.78 (*)    Hemoglobin 8.6 (*)    HCT 26.1 (*)    All other components within normal limits  COMPREHENSIVE METABOLIC PANEL - Abnormal; Notable for the following components:    Glucose, Bld 114 (*)    All other components within normal limits  URINALYSIS, ROUTINE W REFLEX MICROSCOPIC - Abnormal; Notable for the following components:   Protein, ur TRACE (*)    All other components within normal limits  OCCULT BLOOD X 1 CARD TO LAB, STOOL - Abnormal; Notable for the following components:   Fecal Occult Bld POSITIVE (*)    All other components within normal limits  LIPASE, BLOOD  TYPE AND SCREEN    EKG None  Radiology DG Chest Port 1 View  Result Date: 10/01/2022 CLINICAL DATA:  Evaluate for free air EXAM: PORTABLE CHEST 1 VIEW COMPARISON:  Chest x-ray 11/30/2019 FINDINGS: The heart size and mediastinal contours are within normal limits. Both lungs are clear. The visualized skeletal structures are unremarkable. There is no free air under the diaphragm. Bilateral axillary surgical clips are present. IMPRESSION: No active disease. Electronically Signed   By: Darliss Cheney M.D.   On: 10/01/2022 17:24    Procedures .Critical Care  Performed by: Netta Corrigan, PA-C Authorized by: Netta Corrigan, PA-C   Critical care provider statement:    Critical care time (minutes):  30   Critical care time was exclusive of:  Separately billable procedures and treating other patients   Critical care was necessary to treat or prevent imminent or life-threatening deterioration of the following conditions: GI bleed.   Critical care was time spent personally by me on the following activities:  Development of treatment plan with patient or surrogate, discussions with consultants, evaluation of patient's response to treatment, examination of patient, obtaining history from patient or surrogate, review of old charts, re-evaluation of patient's condition, pulse oximetry, ordering and review of radiographic studies, ordering and review of laboratory studies and ordering and performing treatments and interventions   I assumed direction of critical care for this patient from another  provider in my specialty: no     Care discussed with: admitting provider       Medications Ordered in ED Medications  pantoprazole (PROTONIX) injection 40 mg (40 mg Intravenous Given 10/01/22 1612)    ED Course/ Medical Decision Making/ A&P                             Medical Decision Making Amount and/or Complexity of Data Reviewed Labs: ordered. Radiology: ordered.  Risk Prescription drug management. Decision regarding hospitalization.   Ah Trost VHQIONG 72 y.o. presented today for GI bleed/melena. Working DDx that I considered at this time includes, but not limited to, GI bleed,  anemia, perforated bowel, cellulitis.  R/o DDx: Perforated bowel, cellulitis: These are considered less likely due to history of present illness and physical exam findings  Review of prior external notes: 12/04/2020 discharge  Unique Tests and My Interpretation:  Occult card: Positive CMP: Unremarkable UA: Unremarkable Lipase: Unremarkable CBC: Anemia 8.6, down from 11.2 a year ago Chest x-ray: No acute changes EKG: Sinus 76 bpm, no ST elevations or depressions noted, no blocks noted  Discussion with Independent Historian:  Caregiver  Discussion of Management of Tests:  Brahmbhatt, MD GI  Risk: High: hospitalization or escalation of hospital-level care  Risk Stratification Score: None  Plan: On exam patient was in no acute distress stable vitals.  On exam patient did not have any blood in her ostomy tube or around the area.  No signs of infection around the ostomy site as well.  The rest of patient's physical exam was unremarkable.  With chaperone in the room a rectal exam was conducted and the occult test came back positive.  I did not note any rectal abnormalities during the exam.  Caregiver showed pictures of what the ostomy tube has look like at home and it does appear to have dark to black stool in the ostomy tube.  GI will be consulted and anticipating admission.  Patient given Protonix.   At this time patient's hemoglobin is 8.6 and not requiring transfusion however her hemoglobin is down from 11.2 a year ago.  I spoke to the GI specialist and he recommends admission to medicine and that they would see the patient to be scoped.  Patient stable for admission at this time and hospitalist will be consulted.  Hospitalist was consulted and accepted patient admission.  At this time patient stable for admission.  Final Clinical Impression(s) / ED Diagnoses Final diagnoses:  Gastrointestinal hemorrhage, unspecified gastrointestinal hemorrhage type  Anemia, unspecified type    Rx / DC Orders ED Discharge Orders     None        Netta Corrigan, PA-C 10/01/22 1812    Franne Forts, DO 10/11/22 1342

## 2022-10-01 NOTE — ED Notes (Signed)
Patient resting quietly in stretcher, respirations even, unlabored, no acute distress noted. Denies needs at this time.  

## 2022-10-01 NOTE — H&P (Incomplete)
History and Physical    Theresa Stephens:[id number] DOB: 1951/02/22 DOA: 10/01/2022  PCP: Pcp, No  Patient coming from: Home  Chief Complaint: Blood coming from feeding tube  HPI: Theresa Stephens is a 72 y.o. 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  Patient called her GI specialist Dr. Marca Ancona and was sent to the ED for further workup.  Not on blood thinners.  Patient still has bowel movements and reported dark stools as well.  In the ED, patient was hemodynamically stable.  Labs showing hemoglobin 8.6 (previously 11.2-11.6 in September 2022), MCV 93.9.  FOBT positive.  Chest x-ray showing no active disease.  EDP discussed with Dr. Gearldine Shown with [facility] and recommended admission and they will see the patient in the morning.  Patient received IV Protonix 40 mg in the ED.    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    Past Surgical History:  Procedure Laterality Date   ESOPHAGOGASTRODUODENOSCOPY N/A 08/26/2019   Procedure: ESOPHAGOGASTRODUODENOSCOPY (EGD);  Surgeon: Kerin Salen, MD;  Location: [facility] ENDOSCOPY;  Service: Gastroenterology;  Laterality: N/A;   HEMOSTASIS CLIP PLACEMENT  08/26/2019   Procedure: HEMOSTASIS CLIP PLACEMENT;  Surgeon: Kerin Salen, MD;  Location: [facility] ENDOSCOPY;  Service: Gastroenterology;;   HOT HEMOSTASIS N/A 08/26/2019   Procedure: HOT HEMOSTASIS (ARGON PLASMA COAGULATION/BICAP);  Surgeon: Kerin Salen, MD;  Location: [facility] ENDOSCOPY;  Service: Gastroenterology;  Laterality: N/A;   SCLEROTHERAPY  08/26/2019   Procedure: SCLEROTHERAPY;  Surgeon:  Kerin Salen, MD;  Location: [facility] ENDOSCOPY;  Service: Gastroenterology;;   TONGUE BIOPSY     TONSILLECTOMY       reports that she quit smoking about 9 years ago. Her smoking use included cigarettes. She started smoking about 54 years ago. She has a 45 pack-year smoking history. She has never used smokeless tobacco. She reports that she does not drink alcohol and does not use drugs.  No Known Allergies  Family History  Problem Relation Age of Onset   Peptic Ulcer Mother    Cancer Daughter 55       breast ca   Cancer Maternal Uncle        lung ca    Prior to Admission medications   Medication Sig Start Date End Date Taking? Authorizing Provider  FLUoxetine (PROZAC) 20 MG/5ML solution Take 7.5 mLs (30 mg total) by mouth daily. Patient taking differently: Place 30 mg into feeding tube daily. 12/07/19 01/13/21  Arrien, York Ram, MD  mirtazapine (REMERON) 15 MG tablet Place 15 mg into feeding tube at bedtime. 09/28/20   [provider]  ondansetron (ZOFRAN) 4 MG tablet Place 1 tablet (4 mg total) into feeding tube every 6 (six) hours as needed for nausea. 12/05/20   Glade Lloyd, MD  pantoprazole (PROTONIX) 40 MG tablet Take 1 tablet (40 mg total) by mouth 2 (two) times daily before a meal. Patient not taking: Reported on 10/01/2022 12/05/20 01/04/21  Glade Lloyd, MD  sucralfate (CARAFATE) 1 GM/10ML suspension Place 10 mLs (1 g total) into feeding tube every 6 (six) hours. Patient not taking: Reported  on 10/01/2022 12/05/20   Glade Lloyd, MD    Physical Exam: Vitals:   10/01/22 2030 10/01/22 2045 10/01/22 2058 10/01/22 2143  BP: 136/65 121/63  121/63  Pulse: 81 80  77  Resp:  17  16  Temp:   98.2 F (36.8 C) 98 F (36.7 C)  TempSrc:   Axillary   SpO2: 99% 98%  95%  Weight:      Height:        Physical Exam  Labs on Admission: I have personally reviewed following labs and imaging studies  CBC: Recent Labs  Lab 10/01/22 1415  WBC 5.7  NEUTROABS 4.0  HGB  8.6*  HCT 26.1*  MCV 93.9  PLT 255   Basic Metabolic Panel: Recent Labs  Lab 10/01/22 1415  NA 139  K 4.2  CL 103  CO2 28  GLUCOSE 114*  BUN 23  CREATININE 0.56  CALCIUM 9.4   GFR: Estimated Creatinine Clearance: 49.6 mL/min (by C-G formula based on SCr of 0.56 mg/dL). Liver Function Tests: Recent Labs  Lab 10/01/22 1415  AST 24  ALT 15  ALKPHOS 67  BILITOT 0.3  PROT 7.1  ALBUMIN 4.4   Recent Labs  Lab 10/01/22 1422  LIPASE 24   No results for input(s): "AMMONIA" in the last 168 hours. Coagulation Profile: No results for input(s): "INR", "PROTIME" in the last 168 hours. Cardiac Enzymes: No results for input(s): "CKTOTAL", "CKMB", "CKMBINDEX", "TROPONINI" in the last 168 hours. BNP (last 3 results) No results for input(s): "PROBNP" in the last 8760 hours. HbA1C: No results for input(s): "HGBA1C" in the last 72 hours. CBG: No results for input(s): "GLUCAP" in the last 168 hours. Lipid Profile: No results for input(s): "CHOL", "HDL", "LDLCALC", "TRIG", "CHOLHDL", "LDLDIRECT" in the last 72 hours. Thyroid Function Tests: No results for input(s): "TSH", "T4TOTAL", "FREET4", "T3FREE", "THYROIDAB" in the last 72 hours. Anemia Panel: No results for input(s): "VITAMINB12", "FOLATE", "FERRITIN", "TIBC", "IRON", "RETICCTPCT" in the last 72 hours. Urine analysis:    Component Value Date/Time   COLORURINE YELLOW 10/01/2022 1413   APPEARANCEUR CLEAR 10/01/2022 1413   LABSPEC 1.022 10/01/2022 1413   PHURINE 6.5 10/01/2022 1413   GLUCOSEU NEGATIVE 10/01/2022 1413   HGBUR NEGATIVE 10/01/2022 1413   BILIRUBINUR NEGATIVE 10/01/2022 1413   KETONESUR NEGATIVE 10/01/2022 1413   PROTEINUR TRACE (A) 10/01/2022 1413   NITRITE NEGATIVE 10/01/2022 1413   LEUKOCYTESUR NEGATIVE 10/01/2022 1413    Radiological Exams on Admission: DG Chest Port 1 View  Result Date: 10/01/2022 CLINICAL DATA:  Evaluate for free air EXAM: PORTABLE CHEST 1 VIEW COMPARISON:  Chest x-ray 11/30/2019  FINDINGS: The heart size and mediastinal contours are within normal limits. Both lungs are clear. The visualized skeletal structures are unremarkable. There is no free air under the diaphragm. Bilateral axillary surgical clips are present. IMPRESSION: No active disease. Electronically Signed   By: Darliss Cheney M.D.   On: 10/01/2022 17:24    EKG: Independently reviewed.  Sinus rhythm, no acute ischemic changes.  Assessment and Plan  DVT prophylaxis: {Blank single:19197::"Lovenox","SQ Heparin","IV heparin gtt","Xarelto","Eliquis","Coumadin","SCDs","***"} Code Status: {Blank single:19197::"Full Code","DNR","DNR/DNI","Comfort Care","***"} Family Communication: ***  Consults called: ***  Level of care: {Blank single:19197::"Med-Surg","Telemetry bed","Progressive Care Unit","Step Down Unit"} Admission status: *** Time Spent: 75+ minutes***  John Giovanni MD [facility]  If 7PM-7AM, please contact night-coverage [email or web address]  10/01/2022, 11:22 PM

## 2022-10-02 DIAGNOSIS — D500 Iron deficiency anemia secondary to blood loss (chronic): Secondary | ICD-10-CM | POA: Diagnosis not present

## 2022-10-02 DIAGNOSIS — K922 Gastrointestinal hemorrhage, unspecified: Secondary | ICD-10-CM | POA: Diagnosis not present

## 2022-10-02 LAB — HEMOGLOBIN AND HEMATOCRIT, BLOOD
HCT: 23.4 % — ABNORMAL LOW (ref 36.0–46.0)
Hemoglobin: 7.4 g/dL — ABNORMAL LOW (ref 12.0–15.0)

## 2022-10-02 LAB — TYPE AND SCREEN
ABO/RH(D): B POS
Antibody Screen: NEGATIVE

## 2022-10-02 MED ORDER — SUCRALFATE 1 GM/10ML PO SUSP
1.0000 g | Freq: Three times a day (TID) | ORAL | Status: DC
  Administered 2022-10-02 – 2022-10-03 (×4): 1 g
  Filled 2022-10-02 (×4): qty 10

## 2022-10-02 MED ORDER — ACETAMINOPHEN 325 MG PO TABS: 650.0000 mg | ORAL_TABLET | Freq: Four times a day (QID) | ORAL | Status: DC | PRN

## 2022-10-02 MED ORDER — NON FORMULARY: 250.0000 mL | Freq: Four times a day (QID) | Status: DC

## 2022-10-02 MED ORDER — ACETAMINOPHEN 650 MG RE SUPP: 650.0000 mg | Freq: Four times a day (QID) | RECTAL | Status: DC | PRN

## 2022-10-02 MED ORDER — PANTOPRAZOLE SODIUM 40 MG IV SOLR
40.0000 mg | Freq: Two times a day (BID) | INTRAVENOUS | Status: DC
  Administered 2022-10-02 – 2022-10-03 (×3): 40 mg via INTRAVENOUS
  Filled 2022-10-02 (×3): qty 10

## 2022-10-02 MED ORDER — SODIUM CHLORIDE 0.9 % IV SOLN: INTRAVENOUS | Status: DC

## 2022-10-02 MED ORDER — ISOSOURCE 1.5 CAL PO LIQD: 250.0000 mL | Freq: Four times a day (QID) | ORAL | Status: DC

## 2022-10-02 MED ORDER — ISOSOURCE 1.5 CAL PO LIQD
250.0000 mL | Freq: Four times a day (QID) | ORAL | Status: DC
  Administered 2022-10-02 – 2022-10-03 (×3): 250 mL

## 2022-10-02 NOTE — Plan of Care (Signed)
  Problem: Clinical Measurements: Goal: Will remain free from infection Outcome: Progressing   Problem: Pain Managment: Goal: General experience of comfort will improve Outcome: Progressing   Problem: Safety: Goal: Ability to remain free from injury will improve Outcome: Progressing   

## 2022-10-02 NOTE — Final Consult Note (Signed)
[facility] Consultation Note  Referring Provider: [facility] Primary Care Physician:  Pcp, No Primary Gastroenterologist:  Dr. Marca Ancona  Reason for Consultation:  blood per PEG; melena  HPI: Theresa Stephens is a 71 y.o. female Patient with few-day history of melena and some blood per PEG.  Patient showed me pictures.  Occurred this past Friday July 19th.  She reports that the blood per PEG and melena resolved several days ago. No hematemesis.  Similar admission in 2022 in setting of not taking PPI.  Initial evaluation June 2021 patient had GI bleeding, but this was manifested by bright red emesis (which she has not had this time or prior admission in 2022).  Patient had gastric ulcer; deniesNSAIDs, but reports H. Pylori positive with subsequent treatment per patient.     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    Past Surgical History:  Procedure Laterality Date   ESOPHAGOGASTRODUODENOSCOPY N/A 08/26/2019   Procedure: ESOPHAGOGASTRODUODENOSCOPY (EGD);  Surgeon: Kerin Salen, MD;  Location: [facility] ENDOSCOPY;  Service: Gastroenterology;  Laterality: N/A;   HEMOSTASIS CLIP PLACEMENT  08/26/2019   Procedure: HEMOSTASIS CLIP PLACEMENT;  Surgeon: Kerin Salen, MD;  Location: [facility] ENDOSCOPY;  Service: Gastroenterology;;   HOT HEMOSTASIS N/A 08/26/2019   Procedure: HOT HEMOSTASIS (ARGON PLASMA COAGULATION/BICAP);  Surgeon: Kerin Salen, MD;  Location: [facility] ENDOSCOPY;  Service: Gastroenterology;  Laterality: N/A;   SCLEROTHERAPY  08/26/2019   Procedure: Susa Day;  Surgeon: Kerin Salen, MD;  Location: [facility] ENDOSCOPY;  Service: Gastroenterology;;   TONGUE BIOPSY     TONSILLECTOMY      Prior to Admission medications   Medication Sig Start Date End Date Taking? Authorizing Provider   FLUoxetine (PROZAC) 20 MG/5ML solution Take 7.5 mLs (30 mg total) by mouth daily. Patient taking differently: Place 30 mg into feeding tube daily. 12/07/19 10/02/22 Yes Arrien, York Ram, MD  mirtazapine (REMERON) 15 MG tablet Place 15 mg into feeding tube at bedtime. 09/28/20  Yes [provider]  ondansetron (ZOFRAN) 4 MG tablet Place 1 tablet (4 mg total) into feeding tube every 6 (six) hours as needed for nausea. Patient not taking: Reported on 10/02/2022 12/05/20   Glade Lloyd, MD  pantoprazole (PROTONIX) 40 MG tablet Take 1 tablet (40 mg total) by mouth 2 (two) times daily before a meal. Patient not taking: Reported on 10/01/2022 12/05/20 01/04/21  Glade Lloyd, MD  sucralfate (CARAFATE) 1 GM/10ML suspension Place 10 mLs (1 g total) into feeding tube every 6 (six) hours. Patient not taking: Reported on 10/01/2022 12/05/20   Glade Lloyd, MD    Current Facility-Administered Medications  Medication Dose Route Frequency Provider Last Rate Last Admin   0.9 %  sodium chloride infusion   Intravenous Continuous Glade Lloyd, MD 50 mL/hr at 10/02/22 0924 New Bag at 10/02/22 0924   acetaminophen (TYLENOL) tablet 650 mg  650 mg Oral Q6H PRN John Giovanni, MD       Or   acetaminophen (TYLENOL) suppository 650 mg  650 mg Rectal Q6H PRN John Giovanni, MD       pantoprazole (PROTONIX) injection 40 mg  40 mg Intravenous Q12H John Giovanni, MD   40 mg at 10/02/22 0520    Allergies as of 10/01/2022   (No Known Allergies)    Family History  Problem Relation Age of  Onset   Peptic Ulcer Mother    Cancer Daughter 68       breast ca   Cancer Maternal Uncle        lung ca    Social History   Socioeconomic History   Marital status: Legally Separated    Spouse name: Not on file   Number of children: 5   Years of education: 12   Highest education level: High school graduate  Occupational History   Occupation: N/A  Tobacco Use   Smoking status: Former     Current packs/day: 0.00    Average packs/day: 1 pack/day for 45.0 years (45.0 ttl pk-yrs)    Types: Cigarettes    Start date: 03/04/1968    Quit date: 03/04/2013    Years since quitting: 9.5   Smokeless tobacco: Never   Tobacco comments:    stopped smoking 02/2013  Vaping Use   Vaping status: Never Used  Substance and Sexual Activity   Alcohol use: No   Drug use: No    Frequency: 2.0 times per week   Sexual activity: Not Currently  Other Topics Concern   Not on file  Social History Narrative   Not on file   Social Determinants of Health   Financial Resource Strain: Not on file  Food Insecurity: No Food Insecurity (10/01/2022)   Hunger Vital Sign    Worried About Running Out of Food in the Last Year: Never true    Ran Out of Food in the Last Year: Never true  Transportation Needs: No Transportation Needs (10/01/2022)   PRAPARE - Administrator, Civil Service (Medical): No    Lack of Transportation (Non-Medical): No  Physical Activity: Not on file  Stress: Not on file  Social Connections: Not on file  Intimate Partner Violence: Not At Risk (10/01/2022)   Humiliation, Afraid, Rape, and Kick questionnaire    Fear of Current or Ex-Partner: No    Emotionally Abused: No    Physically Abused: No    Sexually Abused: No    Review of Systems: As per HPI, all others negative.  Physical Exam: Vital signs in last 24 hours: Temp:  [97.3 F (36.3 C)-98.2 F (36.8 C)] 97.3 F (36.3 C) (07/24 1022) Pulse Rate:  [70-91] 73 (07/24 1022) Resp:  [15-22] 17 (07/24 1022) BP: (105-136)/(49-73) 125/49 (07/24 1022) SpO2:  [93 %-100 %] 97 % (07/24 1022) Weight:  [60.8 kg] 60.8 kg (07/23 1401) Last BM Date : 09/30/22 General:   Alert,  Well-developed, well-nourished, pleasant and cooperative in NAD Head:  Normocephalic and atraumatic. Eyes:  Sclera clear, no icterus.   Conjunctiva pale Ears:  Normal auditory acuity. Nose:  No deformity, discharge,  or lesions. Mouth:  Prior  surgery, difficult to understand her at times, otherwise no deformity or lesions.  Oropharynx pale and dry Neck:  Supple; no masses or thyromegaly. Lungs:  No respiratory distress Abdomen:  Soft, button peg, no blood. No masses, hepatosplenomegaly or hernias noted. Normal bowel sounds, without guarding, and without rebound.     Msk:  Symmetrical without gross deformities. Normal posture. Pulses:  Normal pulses noted. Extremities:  Without clubbing or edema. Neurologic:  Alert and  oriented x4;  grossly normal neurologically. Skin:  Intact without significant lesions or rashes. Psych:  Alert and cooperative. Normal mood and affect.   Lab Results: Recent Labs    10/01/22 1415 10/02/22 0545  WBC 5.7  --   HGB 8.6* 7.4*  HCT 26.1* 23.4*  PLT 255  --  BMET Recent Labs    10/01/22 1415  NA 139  K 4.2  CL 103  CO2 28  GLUCOSE 114*  BUN 23  CREATININE 0.56  CALCIUM 9.4   LFT Recent Labs    10/01/22 1415  PROT 7.1  ALBUMIN 4.4  AST 24  ALT 15  ALKPHOS 67  BILITOT 0.3   PT/INR No results for input(s): "LABPROT", "INR" in the last 72 hours.  Studies/Results: DG Chest Port 1 View  Result Date: 10/01/2022 CLINICAL DATA:  Evaluate for free air EXAM: PORTABLE CHEST 1 VIEW COMPARISON:  Chest x-ray 11/30/2019 FINDINGS: The heart size and mediastinal contours are within normal limits. Both lungs are clear. The visualized skeletal structures are unremarkable. There is no free air under the diaphragm. Bilateral axillary surgical clips are present. IMPRESSION: No active disease. Electronically Signed   By: Darliss Cheney M.D.   On: 10/01/2022 17:24    Impression:   Blood per button PEG and melena few days ago, resolved.  Now no blood per PEG and brown stools. Anemia, acute on chronic.  Likely reflective prior blood loss. History of peptic ulcer disease.  For the second time (admission September 2022 and this admission), patient has for unclear reasons not been taking PPI for  ulcer bleeding prophylaxis. 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  Plan:   Given high likelihood of ulcer bleeding and recurrent non-compliance with PPI medication and improvement upon admission (no blood per peg or melena x 3 days), I do not feel repeat endoscopy is needed. She will need pantoprazole 40 mg per tube (or other equivalent tube-safe formulation) bid and sucralfate suspension 1 gram po qac tid.  She will need these indefinitely. Upon review of her prior records, she tested positive for H. Pylori in 2022 but doesn't appear to have been treated; she had a couple appts since her 2021 admission that were either canceled or rescheduled. She reported being treated for H. Pylori in 2021 but her H. Pylori antibody was positive. We will check H. Pylori stool antigen. Avoid/minimize NSAIDs as clinically feasible. Resumption PEG feeds ok; she takes nothing per orally. [facility] will follow; unless recurrence of melena or blood per PEG, we do not plan on repeating endoscopy.   LOS: 0 days   Karma Hiney M  10/02/2022, 10:42 AM  Cell [phone] If no answer or after 5 PM call [phone]

## 2022-10-02 NOTE — Progress Notes (Addendum)
Initial Nutrition Assessment  INTERVENTION:   -Patient prefers to resume home tube feeding formula, Isosource 1.5. Patient's family to provide from home.  -Resume 1 carton (250 ml) Isosource 1.5 QID via PEG -Flush with 30 ml free water before and after each feeding -Provides 1500 kcals, 68g protein and 764 ml H2O -Free water recommendation: 150 ml QID  NUTRITION DIAGNOSIS:   Inadequate oral intake related to dysphagia as evidenced by NPO status.  GOAL:   Patient will meet greater than or equal to 90% of their needs  MONITOR:   PO intake, Supplement acceptance, Labs, Weight trends, I & O's, TF tolerance  REASON FOR ASSESSMENT:   Consult Enteral/tube feeding initiation and management  ASSESSMENT:   72 y.o. 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  Patient in room, no family at bedside currently. Pt difficult to understand at times given h/o oral cancer and resection. Pt states she has had no issue with current tube feeding regimen. Uses Isosource 1.5 at home (4-5 cartons daily) and prefers to continue this formula. Has a carton at bedside ready to use. Told patient to wait until RD can place orders before first feeding. Placed non-formulary order for Isosource. RN made aware of plan.  Pt does not consume any PO.  Patient reports stable weight. Per weight records, no weight changes noted.  Medications: Carafate  Labs reviewed.  NUTRITION - FOCUSED PHYSICAL EXAM:  Flowsheet Row Most Recent Value  Orbital Region Mild depletion  Upper Arm Region Mild depletion  Thoracic and Lumbar Region Unable to assess  Buccal Region Mild depletion  Temple Region Mild depletion  Clavicle Bone Region No depletion  Clavicle and Acromion Bone  Region No depletion  Scapular Bone Region No depletion  Dorsal Hand Mild depletion  Patellar Region No depletion  Anterior Thigh Region No depletion  Posterior Calf Region No depletion  Edema (RD Assessment) None  Hair Reviewed  Eyes Reviewed  Mouth Unable to assess  Skin Reviewed  Nails Reviewed       Diet Order:   Diet Order             Diet NPO time specified  Diet effective now                   EDUCATION NEEDS:   No education needs have been identified at this time  Skin:  Skin Assessment: Reviewed RN Assessment  Last BM:  7/22  Height:   Ht Readings from Last 1 Encounters:  10/01/22 4\' 11"  (1.499 m)    Weight:   Wt Readings from Last 1 Encounters:  10/01/22 60.8 kg    BMI:  Body mass index is 27.06 kg/m.  Estimated Nutritional Needs:   Kcal:  1500-1700  Protein:  70-80g  Fluid:  1.5L/day  Tilda Franco, MS, RD, LDN Inpatient Clinical Dietitian Contact information available via Amion

## 2022-10-02 NOTE — Progress Notes (Signed)
   10/02/22 1459  TOC Brief Assessment  Insurance and Status Reviewed  Patient has primary care physician Yes  Home environment has been reviewed Home  Prior level of function: Independent  Prior/Current Home Services No current home services  Social Determinants of Health Reivew SDOH reviewed no interventions necessary  Readmission risk has been reviewed Yes  Transition of care needs no transition of care needs at this time

## 2022-10-02 NOTE — Progress Notes (Signed)
Mobility Specialist - Progress Note   10/02/22 0949  Mobility  Activity Ambulated with assistance in hallway  Level of Assistance Independent after set-up  Assistive Device None  Distance Ambulated (ft) 500 ft  Range of Motion/Exercises Active  Activity Response Tolerated well  Mobility Referral Yes  $Mobility charge 1 Mobility  Mobility Specialist Start Time (ACUTE ONLY) 0940  Mobility Specialist Stop Time (ACUTE ONLY) 0950  Mobility Specialist Time Calculation (min) (ACUTE ONLY) 10 min   Pt received in bed and agreed to mobility. Had no issues. Returned to chair with all needs met.  Marilynne Halsted Mobility Specialist

## 2022-10-02 NOTE — Progress Notes (Signed)
PROGRESS NOTE    Theresa Stephens  AOZ:[id number] DOB: [birth year] DOA: 10/01/2022 PCP: Pcp, No   Brief Narrative:  72 y.o. 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  On presentation, hemoglobin was 8.6 (previously 11.2-11.6 in September 2022) with FOBT positive.  She was started on IV Protonix.  GI was consulted.  Assessment & Plan:   Acute on chronic blood loss anemia Concern for upper GI bleed - presented with bloody drainage from G-tube on 09/27/2022 and melena the following day with some upper abdominal discomfort.  On presentation, hemoglobin was 8.6 (previously 11.2-11.6 in September 2022) with FOBT positive.  She was started on IV Protonix.  Follow GI recommendations. -Monitor H&H.  Hemoglobin 7.4 today.  Transfuse if hemoglobin is less than 7 or if there is active bleeding.  History of oral cancer status post surgical resection and G tube placement -Outpatient follow-up with oncology/ENT.  Tube feeding on hold till GI evaluation  Depression -P.o. meds on hold for now.  Pharmacy med rec is pending.  DVT prophylaxis: SCDs Code Status: Full Family Communication: None at bedside Disposition Plan: Status is: Observation The patient will require care spanning > 2 midnights and should be moved to inpatient because: Of severity of illness.  Needs H&H monitoring and GI evaluation    Consultants: GI  Procedures: None  Antimicrobials: None   Subjective: Patient seen and examined at bedside.  Denies any current abdominal pain, nausea, vomiting or bleeding from G-tube.  Objective: Vitals:   10/01/22 2143 10/02/22 0145 10/02/22 0623 10/02/22 1022  BP: 121/63 (!) 111/57 (!) 105/52 (!) 125/49  Pulse: 77 74 70 73  Resp: 16 16 16 17    Temp: 98 F (36.7 C) 97.8 F (36.6 C) 97.9 F (36.6 C) (!) 97.3 F (36.3 C)  TempSrc:    Oral  SpO2: 95% 96% 97% 97%  Weight:      Height:       No intake or output data in the 24 hours ending 10/02/22 1054 Filed Weights   10/01/22 1401  Weight: 60.8 kg    Examination:  General exam: Appears calm and comfortable.  Looks chronically ill and deconditioned.  On room air. Respiratory system: Bilateral decreased breath sounds at bases with some scattered crackles Cardiovascular system: S1 & S2 heard, Rate controlled Gastrointestinal system: Abdomen is nondistended, soft and nontender. Normal bowel sounds heard.  G-tube present. Extremities: No cyanosis, clubbing, edema  Central nervous system: Alert and awake.  Slow to respond.  Difficult to understand her speech at times.  Poor historian. No focal neurological deficits. Moving extremities Skin: No rashes, lesions or ulcers Psychiatry: Flat affect.  Not agitated.    Data Reviewed: I have personally reviewed following labs and imaging studies  CBC: Recent Labs  Lab 10/01/22 1415 10/02/22 0545  WBC 5.7  --   NEUTROABS 4.0  --   HGB 8.6* 7.4*  HCT 26.1* 23.4*  MCV 93.9  --   PLT 255  --    Basic Metabolic Panel: Recent Labs  Lab 10/01/22 1415  NA 139  K 4.2  CL 103  CO2 28  GLUCOSE 114*  BUN 23  CREATININE 0.56  CALCIUM 9.4   GFR: Estimated Creatinine Clearance: 49.6 mL/min (by C-G formula based on SCr of 0.56 mg/dL). Liver Function  Tests: Recent Labs  Lab 10/01/22 1415  AST 24  ALT 15  ALKPHOS 67  BILITOT 0.3  PROT 7.1  ALBUMIN 4.4   Recent Labs  Lab 10/01/22 1422  LIPASE 24   No results for input(s): "AMMONIA" in the last 168 hours. Coagulation Profile: No results for input(s): "INR", "PROTIME" in the last 168 hours. Cardiac Enzymes: No results for input(s): "CKTOTAL", "CKMB", "CKMBINDEX", "TROPONINI" in the last 168 hours. BNP (last 3 results) No results for input(s): "PROBNP" in the last  8760 hours. HbA1C: No results for input(s): "HGBA1C" in the last 72 hours. CBG: No results for input(s): "GLUCAP" in the last 168 hours. Lipid Profile: No results for input(s): "CHOL", "HDL", "LDLCALC", "TRIG", "CHOLHDL", "LDLDIRECT" in the last 72 hours. Thyroid Function Tests: No results for input(s): "TSH", "T4TOTAL", "FREET4", "T3FREE", "THYROIDAB" in the last 72 hours. Anemia Panel: No results for input(s): "VITAMINB12", "FOLATE", "FERRITIN", "TIBC", "IRON", "RETICCTPCT" in the last 72 hours. Sepsis Labs: No results for input(s): "PROCALCITON", "LATICACIDVEN" in the last 168 hours.  No results found for this or any previous visit (from the past 240 hour(s)).       Radiology Studies: DG Chest Port 1 View  Result Date: 10/01/2022 CLINICAL DATA:  Evaluate for free air EXAM: PORTABLE CHEST 1 VIEW COMPARISON:  Chest x-ray 11/30/2019 FINDINGS: The heart size and mediastinal contours are within normal limits. Both lungs are clear. The visualized skeletal structures are unremarkable. There is no free air under the diaphragm. Bilateral axillary surgical clips are present. IMPRESSION: No active disease. Electronically Signed   By: Darliss Cheney M.D.   On: 10/01/2022 17:24        Scheduled Meds:  pantoprazole (PROTONIX) IV  40 mg Intravenous Q12H   Continuous Infusions:  sodium chloride 50 mL/hr at 10/02/22 9604          Glade Lloyd, MD [facility] 10/02/2022, 10:54 AM  '

## 2022-10-03 ENCOUNTER — Other Ambulatory Visit (HOSPITAL_COMMUNITY): Payer: Self-pay

## 2022-10-03 DIAGNOSIS — D500 Iron deficiency anemia secondary to blood loss (chronic): Secondary | ICD-10-CM | POA: Diagnosis not present

## 2022-10-03 DIAGNOSIS — K922 Gastrointestinal hemorrhage, unspecified: Secondary | ICD-10-CM | POA: Diagnosis not present

## 2022-10-03 LAB — CBC WITH DIFFERENTIAL/PLATELET
Abs Immature Granulocytes: 0 10*3/uL (ref 0.00–0.07)
Basophils Absolute: 0 10*3/uL (ref 0.0–0.1)
Basophils Relative: 0 %
Eosinophils Absolute: 0.1 10*3/uL (ref 0.0–0.5)
Eosinophils Relative: 3 %
HCT: 23.2 % — ABNORMAL LOW (ref 36.0–46.0)
Hemoglobin: 7.4 g/dL — ABNORMAL LOW (ref 12.0–15.0)
Immature Granulocytes: 0 %
Lymphocytes Relative: 25 %
Lymphs Abs: 0.7 10*3/uL (ref 0.7–4.0)
MCH: 30.7 pg (ref 26.0–34.0)
MCHC: 31.9 g/dL (ref 30.0–36.0)
MCV: 96.3 fL (ref 80.0–100.0)
Monocytes Absolute: 0.4 10*3/uL (ref 0.1–1.0)
Monocytes Relative: 13 %
Neutro Abs: 1.6 10*3/uL — ABNORMAL LOW (ref 1.7–7.7)
Neutrophils Relative %: 59 %
RBC: 2.41 MIL/uL — ABNORMAL LOW (ref 3.87–5.11)
RDW: 13.4 % (ref 11.5–15.5)
WBC: 2.7 10*3/uL — ABNORMAL LOW (ref 4.0–10.5)
nRBC: 0 % (ref 0.0–0.2)

## 2022-10-03 LAB — BASIC METABOLIC PANEL
Anion gap: 7 (ref 5–15)
CO2: 26 mmol/L (ref 22–32)
Calcium: 8.2 mg/dL — ABNORMAL LOW (ref 8.9–10.3)
Chloride: 107 mmol/L (ref 98–111)
Creatinine, Ser: 0.38 mg/dL — ABNORMAL LOW (ref 0.44–1.00)
GFR, Estimated: >60 mL/min (ref >60)
Potassium: 3.6 mmol/L (ref 3.5–5.1)
Sodium: 140 mmol/L (ref 135–145)

## 2022-10-03 LAB — MAGNESIUM: Magnesium: 2.1 mg/dL (ref 1.7–2.4)

## 2022-10-03 MED ORDER — SUCRALFATE 1 GM/10ML PO SUSP: 1.0000 g | Freq: Three times a day (TID) | ORAL | 2 refills | Status: AC

## 2022-10-03 MED ORDER — FLUOXETINE HCL 20 MG/5ML PO SOLN: 30.0000 mg | Freq: Every day | ORAL | Status: AC

## 2022-10-03 MED ORDER — ONDANSETRON HCL 4 MG PO TABS: 4.0000 mg | ORAL_TABLET | Freq: Four times a day (QID) | ORAL | 0 refills | Status: AC | PRN

## 2022-10-03 MED ORDER — OMEPRAZOLE 2 MG/ML ORAL SUSPENSION: 40.0000 mg | Freq: Two times a day (BID) | ORAL | 0 refills | Status: DC

## 2022-10-03 MED ORDER — ESOMEPRAZOLE MAGNESIUM 40 MG PO PACK: 20.0000 mg | PACK | Freq: Two times a day (BID) | ORAL | 2 refills | Status: AC

## 2022-10-03 NOTE — Discharge Summary (Addendum)
Physician Discharge Summary  Theresa Stephens UJW:[id number] DOB: 1950/07/05 DOA: 10/01/2022  PCP: Oneita Hurt, No  Admit date: 10/01/2022 Discharge date: 10/03/2022  Admitted From: Home Disposition: Home  Recommendations for Outpatient Follow-up:  Follow up with PCP in 1 week with repeat CBC/BMP Outpatient follow-up with GI Follow up in ED if symptoms worsen or new appear   Home Health: No Equipment/Devices: None  Discharge Condition: Stable CODE STATUS: Full Diet recommendation: Tube feeding diet  Brief/Interim Summary: 72 y.o. 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  On presentation, hemoglobin was 8.6 (previously 11.2-11.6 in September 2022) with FOBT positive.  She was started on IV Protonix.  GI was consulted.  She was managed conservatively as per GI recommendations.  Subsequently, her condition has improved and she has not had any more bleeding through the tube.  GI has cleared the patient for discharge.  She will be discharged home today on PPI and sucralfate via tube indefinitely as per GI recommendations.  Outpatient follow-up with PCP and GI.  Discharge Diagnoses:   Acute on chronic blood loss anemia Concern for upper GI bleed - presented with bloody drainage from G-tube on 09/27/2022 and melena the following day with some upper abdominal discomfort.  On presentation, hemoglobin was 8.6 (previously 11.2-11.6 in September 2022) with FOBT positive.  She was started on IV Protonix.  -Hemoglobin remains at 7.4 again today.  Outpatient follow-up of CBC. -She was managed conservatively as per GI recommendations.  Subsequently, her condition has improved and she has not had any more bleeding through the tube.  GI has cleared the patient for  discharge.  She will be discharged home today on PPI twice daily and sucralfate via tube indefinitely as per GI recommendations.  Outpatient follow-up with PCP and GI.   History of oral cancer status post surgical resection and G tube placement -Outpatient follow-up with oncology/ENT.  Tube feeding has been resumed and patient is tolerating it.    Depression -Outpatient follow-up.  Continue home regimen.  Discharge Instructions  Discharge Instructions     Increase activity slowly   Complete by: As directed       Allergies as of 10/03/2022   No Known Allergies      Medication List     STOP taking these medications    pantoprazole 40 MG tablet Commonly known as: Protonix       TAKE these medications    esomeprazole 40 MG packet Commonly known as: NexIUM Take 20 mg by mouth 2 (two) times daily before a meal. Via TUBE   FLUoxetine 20 MG/5ML solution Commonly known as: PROZAC Place 7.5 mLs (30 mg total) into feeding tube daily.   mirtazapine 15 MG tablet Commonly known as: REMERON Place 15 mg into feeding tube at bedtime.   ondansetron 4 MG tablet Commonly known as: ZOFRAN Place 1 tablet (4 mg total) into feeding tube every 6 (six) hours as needed for nausea or vomiting. What changed: reasons to take this   sucralfate 1 GM/10ML suspension Commonly known as: CARAFATE Place 10 mLs (1 g total) into feeding tube 4 (four) times daily -  with meals and at bedtime. What changed: when to take this          No Known Allergies  Consultations: GI   Procedures/Studies: DG Chest  Port 1 View  Result Date: 10/01/2022 CLINICAL DATA:  Evaluate for free air EXAM: PORTABLE CHEST 1 VIEW COMPARISON:  Chest x-ray 11/30/2019 FINDINGS: The heart size and mediastinal contours are within normal limits. Both lungs are clear. The visualized skeletal structures are unremarkable. There is no free air under the diaphragm. Bilateral axillary surgical clips are present. IMPRESSION:  No active disease. Electronically Signed   By: Darliss Cheney M.D.   On: 10/01/2022 17:24      Subjective: Patient seen and examined at bedside.  Wants to go home today.  No bleeding from G-tube reported.  No fever, chest pain or shortness of breath reported.  Discharge Exam: Vitals:   10/02/22 1948 10/03/22 0559  BP: 123/70 106/61  Pulse: 77 73  Resp: 16 16  Temp: 97.9 F (36.6 C) (!) 97.5 F (36.4 C)  SpO2: 95% 95%    General: Pt is alert, awake, not in acute distress.  Chronically ill and deconditioned looking.  On room air.  Slow to respond.  Difficult to understand her speech at times. Cardiovascular: rate controlled, S1/S2 + Respiratory: bilateral decreased breath sounds at bases Abdominal: Soft, NT, ND, bowel sounds +. G tube present Extremities: no edema, no cyanosis    The results of significant diagnostics from this hospitalization (including imaging, microbiology, ancillary and laboratory) are listed below for reference.     Microbiology: No results found for this or any previous visit (from the past 240 hour(s)).   Labs: BNP (last 3 results) No results for input(s): "BNP" in the last 8760 hours. Basic Metabolic Panel: Recent Labs  Lab 10/01/22 1415 10/03/22 0501  NA 139 140  K 4.2 3.6  CL 103 107  CO2 28 26  GLUCOSE 114* 96  BUN 23 14  CREATININE 0.56 0.38*  CALCIUM 9.4 8.2*  MG  --  2.1   Liver Function Tests: Recent Labs  Lab 10/01/22 1415  AST 24  ALT 15  ALKPHOS 67  BILITOT 0.3  PROT 7.1  ALBUMIN 4.4   Recent Labs  Lab 10/01/22 1422  LIPASE 24   No results for input(s): "AMMONIA" in the last 168 hours. CBC: Recent Labs  Lab 10/01/22 1415 10/02/22 0545 10/03/22 0501  WBC 5.7  --  2.7*  NEUTROABS 4.0  --  1.6*  HGB 8.6* 7.4* 7.4*  HCT 26.1* 23.4* 23.2*  MCV 93.9  --  96.3  PLT 255  --  211   Cardiac Enzymes: No results for input(s): "CKTOTAL", "CKMB", "CKMBINDEX", "TROPONINI" in the last 168 hours. BNP: Invalid input(s):  "POCBNP" CBG: No results for input(s): "GLUCAP" in the last 168 hours. D-Dimer No results for input(s): "DDIMER" in the last 72 hours. Hgb A1c No results for input(s): "HGBA1C" in the last 72 hours. Lipid Profile No results for input(s): "CHOL", "HDL", "LDLCALC", "TRIG", "CHOLHDL", "LDLDIRECT" in the last 72 hours. Thyroid function studies No results for input(s): "TSH", "T4TOTAL", "T3FREE", "THYROIDAB" in the last 72 hours.  Invalid input(s): "FREET3" Anemia work up No results for input(s): "VITAMINB12", "FOLATE", "FERRITIN", "TIBC", "IRON", "RETICCTPCT" in the last 72 hours. Urinalysis    Component Value Date/Time   COLORURINE YELLOW 10/01/2022 1413   APPEARANCEUR CLEAR 10/01/2022 1413   LABSPEC 1.022 10/01/2022 1413   PHURINE 6.5 10/01/2022 1413   GLUCOSEU NEGATIVE 10/01/2022 1413   HGBUR NEGATIVE 10/01/2022 1413   BILIRUBINUR NEGATIVE 10/01/2022 1413   KETONESUR NEGATIVE 10/01/2022 1413   PROTEINUR TRACE (A) 10/01/2022 1413   NITRITE NEGATIVE 10/01/2022 1413   LEUKOCYTESUR  NEGATIVE 10/01/2022 1413   Sepsis Labs Recent Labs  Lab 10/01/22 1415 10/03/22 0501  WBC 5.7 2.7*   Microbiology No results found for this or any previous visit (from the past 240 hour(s)).   Time coordinating discharge: 35 minutes  SIGNED:   Glade Lloyd, MD  [facility] 10/03/2022, 10:55 AM

## 2022-10-03 NOTE — TOC Benefit Eligibility Note (Signed)
Pharmacy Patient Advocate Encounter  Insurance verification completed.    The patient is insured through [facility] Medicare Part D  Ran test claim for esomeprazole (Nexium) 40 mg packet and the current 30 day co-pay is $0.00.  Ran test claim for Prilosec 2.5 mg packet and Product not covered  Ran test claim for pantoprazole sodium (Protonix) 40 mg packet and Product not covered  This test claim was processed through [employer]- copay amounts may vary at other pharmacies due to [employer], or as the patient moves through the different stages of their insurance plan.    Theresa Stephens, CPHT Pharmacy Patient Advocate Specialist [facility] Patient Advocate Team Direct Number: [phone]  Fax: [phone]

## 2022-10-03 NOTE — Progress Notes (Signed)
Subjective: No blood per PEG. No further melena  Objective: Vital signs in last 24 hours: Temp:  [97.5 F (36.4 C)-97.9 F (36.6 C)] 97.5 F (36.4 C) (07/25 0559) Pulse Rate:  [73-78] 73 (07/25 0559) Resp:  [16-18] 16 (07/25 0559) BP: (106-123)/(49-70) 106/61 (07/25 0559) SpO2:  [95 %-99 %] 95 % (07/25 0559) Weight:  [61.9 kg] 61.9 kg (07/25 0559) Weight change: 1.118 kg Last BM Date : 10/01/22  PE: GEN:  NAD ABD:  Soft, button PEG  Lab Results: CBC    Component Value Date/Time   WBC 2.7 (L) 10/03/2022 0501   RBC 2.41 (L) 10/03/2022 0501   HGB 7.4 (L) 10/03/2022 0501   HGB 13.8 03/12/2013 1147   HCT 23.2 (L) 10/03/2022 0501   HCT 41.3 03/12/2013 1147   PLT 211 10/03/2022 0501   PLT 312 03/12/2013 1147   MCV 96.3 10/03/2022 0501   MCV 86.7 03/12/2013 1147   MCH 30.7 10/03/2022 0501   MCHC 31.9 10/03/2022 0501   RDW 13.4 10/03/2022 0501   RDW 13.1 03/12/2013 1147   LYMPHSABS 0.7 10/03/2022 0501   LYMPHSABS 1.9 03/12/2013 1147   MONOABS 0.4 10/03/2022 0501   MONOABS 0.8 03/12/2013 1147   EOSABS 0.1 10/03/2022 0501   EOSABS 0.2 03/12/2013 1147   BASOSABS 0.0 10/03/2022 0501   BASOSABS 0.1 03/12/2013 1147  CMP     Component Value Date/Time   NA 140 10/03/2022 0501   NA 140 08/16/2013 1602   K 3.6 10/03/2022 0501   K 3.8 08/16/2013 1602   CL 107 10/03/2022 0501   CO2 26 10/03/2022 0501   CO2 31 (H) 08/16/2013 1602   GLUCOSE 96 10/03/2022 0501   GLUCOSE 131 08/16/2013 1602   BUN 14 10/03/2022 0501   BUN 14.2 12/31/2013 0858   CREATININE 0.38 (L) 10/03/2022 0501   CREATININE 0.7 12/31/2013 0858   CALCIUM 8.2 (L) 10/03/2022 0501   CALCIUM 9.9 08/16/2013 1602   PROT 7.1 10/01/2022 1415   PROT 7.6 03/12/2013 1147   ALBUMIN 4.4 10/01/2022 1415   ALBUMIN 3.8 03/12/2013 1147   AST 24 10/01/2022 1415   AST 20 03/12/2013 1147   ALT 15 10/01/2022 1415   ALT 26 03/12/2013 1147   ALKPHOS 67 10/01/2022 1415   ALKPHOS 110 03/12/2013 1147   BILITOT 0.3 10/01/2022  1415   BILITOT 0.21 03/12/2013 1147   GFRNONAA >60 10/03/2022 0501   Assessment:   Blood per button PEG and melena few days ago, resolved.  Now no blood per PEG and brown stools. Anemia, acute on chronic.  Likely reflective prior blood loss. History of peptic ulcer disease.  For the second time (admission September 2022 and this admission), patient has for unclear reasons not been taking PPI for ulcer bleeding prophylaxis. 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  Plan:   Pantoprazole liquid 40 mg per tube bid (or ppi equivalent) and sucralfate 1 gram suspension per PEG qac and at bedtime.  She should take these INDEFINITELY (prior two admissions she represented with bleeding after stopping these medications). Needs outpatient CBC in 1-2 weeks. Tube feeds as tolerated. OK to discharge from GI perspective; needs outpatient follow-up with Dr. Marca Ancona in 4-6 weeks. [facility] will sign-off; please call with questions; thank you for the consultation.   Theresa Stephens 10/03/2022, 10:34 AM   Cell [phone] If no answer or after 5 PM call [phone]

## 2022-10-03 NOTE — Plan of Care (Signed)
  Problem: Clinical Measurements: Goal: Will remain free from infection Outcome: Progressing   Problem: Nutrition: Goal: Adequate nutrition will be maintained Outcome: Progressing   Problem: Pain Managment: Goal: General experience of comfort will improve Outcome: Progressing   Problem: Safety: Goal: Ability to remain free from injury will improve Outcome: Progressing   

## 2024-01-23 ENCOUNTER — Emergency Department (HOSPITAL_BASED_OUTPATIENT_CLINIC_OR_DEPARTMENT_OTHER)
Admission: EM | Admit: 2024-01-23 | Discharge: 2024-01-23 | Disposition: A | Discharge status: Home / Self Care | Source: Ambulatory Visit | Attending: Emergency Medicine | Admitting: Emergency Medicine

## 2024-01-23 ENCOUNTER — Other Ambulatory Visit: Payer: Self-pay

## 2024-01-23 ENCOUNTER — Emergency Department (HOSPITAL_BASED_OUTPATIENT_CLINIC_OR_DEPARTMENT_OTHER)

## 2024-01-23 DIAGNOSIS — Z79899 Other long term (current) drug therapy: Secondary | ICD-10-CM | POA: Insufficient documentation

## 2024-01-23 DIAGNOSIS — R220 Localized swelling, mass and lump, head: Secondary | ICD-10-CM | POA: Diagnosis present

## 2024-01-23 DIAGNOSIS — Z931 Gastrostomy status: Secondary | ICD-10-CM | POA: Insufficient documentation

## 2024-01-23 DIAGNOSIS — L03211 Cellulitis of face: Secondary | ICD-10-CM | POA: Insufficient documentation

## 2024-01-23 DIAGNOSIS — Z85819 Personal history of malignant neoplasm of unspecified site of lip, oral cavity, and pharynx: Secondary | ICD-10-CM | POA: Diagnosis not present

## 2024-01-23 LAB — CBC WITH DIFFERENTIAL/PLATELET
Abs Immature Granulocytes: 0.02 K/uL (ref 0.00–0.07)
Basophils Absolute: 0 K/uL (ref 0.0–0.1)
Basophils Relative: 0 %
Eosinophils Absolute: 0.1 K/uL (ref 0.0–0.5)
Eosinophils Relative: 2 %
HCT: 38.9 % (ref 36.0–46.0)
Hemoglobin: 12.8 g/dL (ref 12.0–15.0)
Immature Granulocytes: 0 %
Lymphocytes Relative: 17 %
Lymphs Abs: 1.2 K/uL (ref 0.7–4.0)
MCH: 30.4 pg (ref 26.0–34.0)
MCHC: 32.9 g/dL (ref 30.0–36.0)
MCV: 92.4 fL (ref 80.0–100.0)
Monocytes Absolute: 0.5 K/uL (ref 0.1–1.0)
Monocytes Relative: 7 %
Neutro Abs: 5.1 K/uL (ref 1.7–7.7)
Neutrophils Relative %: 74 %
Platelets: 276 K/uL (ref 150–400)
RBC: 4.21 MIL/uL (ref 3.87–5.11)
RDW: 12.8 % (ref 11.5–15.5)
WBC: 6.9 K/uL (ref 4.0–10.5)
nRBC: 0 % (ref 0.0–0.2)

## 2024-01-23 LAB — COMPREHENSIVE METABOLIC PANEL WITH GFR
ALT: 20 U/L (ref 0–44)
AST: 28 U/L (ref 15–41)
Albumin: 4.4 g/dL (ref 3.5–5.0)
Alkaline Phosphatase: 100 U/L (ref 38–126)
Anion gap: 10 (ref 5–15)
BUN: 16 mg/dL (ref 8–23)
CO2: 31 mmol/L (ref 22–32)
Calcium: 10 mg/dL (ref 8.9–10.3)
Chloride: 100 mmol/L (ref 98–111)
Creatinine, Ser: 0.59 mg/dL (ref 0.44–1.00)
GFR, Estimated: >60 mL/min (ref >60)
Glucose, Bld: 110 mg/dL — ABNORMAL HIGH (ref 70–99)
Potassium: 4 mmol/L (ref 3.5–5.1)
Sodium: 142 mmol/L (ref 135–145)
Total Bilirubin: 0.3 mg/dL (ref 0.0–1.2)
Total Protein: 7.7 g/dL (ref 6.5–8.1)

## 2024-01-23 LAB — RESP PANEL BY RT-PCR (RSV, FLU A&B, COVID)  RVPGX2
Influenza A by PCR: NEGATIVE
Influenza B by PCR: NEGATIVE
Resp Syncytial Virus by PCR: NEGATIVE
SARS Coronavirus 2 by RT PCR: NEGATIVE

## 2024-01-23 LAB — LACTIC ACID, PLASMA: Lactic Acid, Venous: 1.8 mmol/L (ref 0.5–1.9)

## 2024-01-23 MED ORDER — CEPHALEXIN 250 MG PO CAPS
500.0000 mg | ORAL_CAPSULE | Freq: Once | ORAL | Status: DC
  Filled 2024-01-23: qty 2

## 2024-01-23 MED ORDER — CEPHALEXIN 500 MG PO CAPS: 500.0000 mg | ORAL_CAPSULE | Freq: Four times a day (QID) | ORAL | 0 refills | Status: AC

## 2024-01-23 MED ORDER — FENTANYL CITRATE (PF) 50 MCG/ML IJ SOSY
50.0000 ug | PREFILLED_SYRINGE | Freq: Once | INTRAMUSCULAR | Status: AC
  Administered 2024-01-23: 50 ug via INTRAVENOUS
  Filled 2024-01-23: qty 1

## 2024-01-23 MED ORDER — IOHEXOL 300 MG/ML  SOLN
75.0000 mL | Freq: Once | INTRAMUSCULAR | Status: AC | PRN
  Administered 2024-01-23: 75 mL via INTRAVENOUS

## 2024-01-23 NOTE — Discharge Instructions (Signed)
 You were seen in the emergency department for your facial swelling.  You had no signs of abscess or other deep space infection and it appears that you have a skin infection to your face.  I have given you a course of antibiotics and you should complete this as prescribed.  You can take Tylenol  or Motrin as needed for pain and you should follow-up with your primary doctor or your ENT in the next few days to have your symptoms rechecked.  You should return to the emergency department if you are having fevers despite the antibiotics, spreading redness or swelling despite the antibiotics or any other new or concerning symptoms.

## 2024-01-23 NOTE — ED Provider Notes (Signed)
 [place] EMERGENCY DEPARTMENT AT [facility] Provider Note   CSN: [id number] Arrival date & time: 01/23/24  1715     Patient presents with: Facial Swelling   Theresa Stephens is a 73 y.o. female.   Patient is a 73 year old female with a past medical history of oral squamous cell carcinoma status post resection 10 years ago with G-tube in place presenting to the emergency department with facial swelling.  Patient is here with her daughter who helps provide history.  She states that she has had swelling along her right lower jaw for about the last week.  She states that has not significantly increased in size.  She states that it is painful.  She states that she has been having fevers at home.  She states that she does feel like she has thick mucus in the back of her throat that she cannot cough up.  Her daughter reports that she has labored breathing at baseline but seems to be breathing a little bit harder than usual.  They went to urgent care this morning and were recommended to come to the ED for further evaluation.  The history is provided by the patient and a relative.       Prior to Admission medications   Medication Sig Start Date End Date Taking? Authorizing Provider  cephALEXin (KEFLEX) 500 MG capsule Take 1 capsule (500 mg total) by mouth 4 (four) times daily for 5 days. 01/23/24 01/28/24 Yes Ellouise, Tarius Stangelo K, DO  esomeprazole  (NEXIUM ) 40 MG packet Take 20 mg by mouth 2 (two) times daily before a meal. Via TUBE 10/03/22   Cheryle Page, MD  FLUoxetine  (PROZAC ) 20 MG/5ML solution Place 7.5 mLs (30 mg total) into feeding tube daily. 10/03/22 11/02/22  Cheryle Page, MD  mirtazapine  (REMERON ) 15 MG tablet Place 15 mg into feeding tube at bedtime. 09/28/20   [provider]  ondansetron  (ZOFRAN ) 4 MG tablet Place 1 tablet (4 mg total) into feeding tube every 6 (six) hours as needed for nausea or vomiting. 10/03/22   Cheryle Page, MD  sucralfate  (CARAFATE ) 1  GM/10ML suspension Place 10 mLs (1 g total) into feeding tube 4 (four) times daily -  with meals and at bedtime. 10/03/22   Cheryle Page, MD    Allergies: Patient has no known allergies.    Review of Systems  Updated Vital Signs BP (!) 156/81   Pulse 84   Temp 97.9 F (36.6 C) (Axillary)   Resp (!) 23   SpO2 95%   Physical Exam Vitals and nursing note reviewed.  Constitutional:      General: She is not in acute distress.    Appearance: Normal appearance.  HENT:     Head: Normocephalic and atraumatic.     Nose: Nose normal.     Mouth/Throat:     Comments: Significant swelling to R lower jaw, mild overlying erythema, no palpable fluctuance Prosthetic tongue, no dentition throughout, no swelling to posterior oropharynx Eyes:     Extraocular Movements: Extraocular movements intact.     Conjunctiva/sclera: Conjunctivae normal.  Cardiovascular:     Rate and Rhythm: Normal rate and regular rhythm.     Heart sounds: Normal heart sounds.  Pulmonary:     Effort: Pulmonary effort is normal.     Breath sounds: Normal breath sounds. No stridor.  Abdominal:     General: Abdomen is flat.     Palpations: Abdomen is soft.  Musculoskeletal:        General: Normal range  of motion.     Cervical back: Normal range of motion.  Skin:    General: Skin is warm and dry.  Neurological:     General: No focal deficit present.     Mental Status: She is alert and oriented to person, place, and time.  Psychiatric:        Mood and Affect: Mood normal.        Behavior: Behavior normal.     (all labs ordered are listed, but only abnormal results are displayed) Labs Reviewed  COMPREHENSIVE METABOLIC PANEL WITH GFR - Abnormal; Notable for the following components:      Result Value   Glucose, Bld 110 (*)    All other components within normal limits  RESP PANEL BY RT-PCR (RSV, FLU A&B, COVID)  [id number]  LACTIC ACID, PLASMA  CBC WITH DIFFERENTIAL/PLATELET    EKG: None  Radiology: CT Soft  Tissue Neck W Contrast Result Date: 01/23/2024 EXAM: CT NECK WITH CONTRAST 01/23/2024 06:46:45 PM TECHNIQUE: CT of the neck was performed with the administration of 75 mL of iohexol  (OMNIPAQUE ) 300 MG/ML solution. Multiplanar reformatted images are provided for review. Automated exposure control, iterative reconstruction, and/or weight based adjustment of the mA/kV was utilized to reduce the radiation dose to as low as reasonably achievable. COMPARISON: None available. CLINICAL HISTORY: jaw swelling, remote history or oral cancer FINDINGS: AERODIGESTIVE TRACT: The anterior mandible and floor of mouth was resected. Muscle flap is in place. Epiglottis and larynx are intact. No discrete mass lesion is present. SALIVARY GLANDS: The parotid and submandibular glands are unremarkable. THYROID : Unremarkable. LYMPH NODES: No suspicious cervical lymphadenopathy. SOFT TISSUES: Soft tissue swelling is noted in the lips. No discrete mass lesion is present. BRAIN, ORBITS, SINUSES AND MASTOIDS: No acute abnormality. LUNGS AND MEDIASTINUM: Atherosclerotic changes are present at the aortic arch and proximal great vessels without focal stenosis or aneurysm. BONES: Mild degenerative changes are present in the cervical spine. Procedure report. IMPRESSION: 1. Soft tissue swelling in the lips without discrete mass lesion. 2. Postoperative changes of anterior mandible and floor of mouth resection with muscle flap in place, without discrete residual or recurrent tumor. 3. Atherosclerosis of the aortic arch and proximal great vessels, without focal stenosis or aneurysm. Electronically signed by: Lonni Necessary MD 01/23/2024 07:11 PM EST RP Workstation: [id number]     Procedures   Medications Ordered in the ED  fentaNYL  (SUBLIMAZE ) injection 50 mcg (50 mcg Intravenous Given 01/23/24 1848)  iohexol  (OMNIPAQUE ) 300 MG/ML solution 75 mL (75 mLs Intravenous Contrast Given 01/23/24 1841)  cephALEXin (KEFLEX) capsule 500 mg (500 mg  Per Tube Given 01/23/24 1942)    Clinical Course as of 01/23/24 1942  Fri Jan 23, 2024  1918 CT with soft tissue swelling without discrete mass. Will treat as cellulitis and recommended outpatient PCP or ENT follow up. [VK]    Clinical Course User Index [VK] Kingsley, Sylvia Kondracki K, DO                                 Medical Decision Making This patient presents to the ED with chief complaint(s) of facial swelling with pertinent past medical history of prior oral cancer s/p resection which further complicates the presenting complaint. The complaint involves an extensive differential diagnosis and also carries with it a high risk of complications and morbidity.    The differential diagnosis includes malignancy, cellulitis, abscess  Additional history obtained: Additional history obtained from family  Records reviewed Care Everywhere/External Records  ED Course and Reassessment: On patient's arrival she is hemodynamically stable in no acute distress, she had labs initiated in triage that are within normal range, no signs of sepsis or severe dehydration.  Patient will have CT soft tissue of her neck to evaluate for etiology of her swelling.  She was given pain control and will be closely reassessed.  Independent labs interpretation:  The following labs were independently interpreted: within normal range  Independent visualization of imaging: - I independently visualized the following imaging with scope of interpretation limited to determining acute life threatening conditions related to emergency care: CT soft tissue neck, which revealed facial swelling without abscess or mass  Consultation: - Consulted or discussed management/test interpretation w/ external professional: N/A  Consideration for admission or further workup: Patient has no emergent conditions requiring admission or further work-up at this time and is stable for discharge home with primary care follow-up  Social Determinants  of health: N/A    Amount and/or Complexity of Data Reviewed Labs: ordered. Radiology: ordered.  Risk Prescription drug management.       Final diagnoses:  Cellulitis of face    ED Discharge Orders          Ordered    cephALEXin (KEFLEX) 500 MG capsule  4 times daily        01/23/24 1940               Kingsley, Eugenia Eldredge K, [place] 01/23/24 1942

## 2024-01-23 NOTE — ED Notes (Signed)
 Pt placed on 1lt FIO2 due to sats dropping while sleeping

## 2024-01-23 NOTE — ED Notes (Signed)
 Spoke with MD, we do not have supplies that can connect to this patients G tube to start Keflex here. MD okay with patient starting medication at home.

## 2024-01-23 NOTE — ED Notes (Signed)
 Reviewed AVS/discharge instructions with patient. Time allotted for and all questions answered. Patient is agreeable for d/c and escorted to ED exit by staff.

## 2024-01-23 NOTE — ED Triage Notes (Signed)
 Pt POV reporting increased facial swelling over past week, seen at [facility], advised to come to ED for CT.

## 2024-03-05 NOTE — Progress Notes (Signed)
 Viral swab ordered to eval for etiology of symptoms
